# Patient Record
Sex: Male | Born: 1950
Health system: Southern US, Community
[De-identification: ages and names within clinical notes are randomized; demographics above are authoritative.]

## PROBLEM LIST (undated history)

## (undated) DIAGNOSIS — I5022 Chronic systolic (congestive) heart failure: Secondary | ICD-10-CM

## (undated) DIAGNOSIS — I2510 Atherosclerotic heart disease of native coronary artery without angina pectoris: Secondary | ICD-10-CM

## (undated) DIAGNOSIS — E039 Hypothyroidism, unspecified: Secondary | ICD-10-CM

## (undated) DIAGNOSIS — E782 Mixed hyperlipidemia: Secondary | ICD-10-CM

## (undated) DIAGNOSIS — N200 Calculus of kidney: Secondary | ICD-10-CM

## (undated) DIAGNOSIS — K760 Fatty (change of) liver, not elsewhere classified: Secondary | ICD-10-CM

## (undated) DIAGNOSIS — Z951 Presence of aortocoronary bypass graft: Secondary | ICD-10-CM

## (undated) DIAGNOSIS — I10 Essential (primary) hypertension: Secondary | ICD-10-CM

## (undated) DIAGNOSIS — E119 Type 2 diabetes mellitus without complications: Secondary | ICD-10-CM

## (undated) HISTORY — DX: Atherosclerotic heart disease of native coronary artery without angina pectoris: I25.10

## (undated) HISTORY — DX: Mixed hyperlipidemia: E78.2

## (undated) HISTORY — DX: Chronic systolic (congestive) heart failure: I50.22

## (undated) HISTORY — DX: Fatty (change of) liver, not elsewhere classified: K76.0

## (undated) HISTORY — DX: Essential (primary) hypertension: I10

## (undated) HISTORY — DX: Hypothyroidism, unspecified: E03.9

---

## 1979-08-24 HISTORY — PX: WISDOM TOOTH EXTRACTION: SHX21

## 2000-12-25 ENCOUNTER — Encounter: Payer: Self-pay | Admitting: Family Medicine

## 2000-12-25 ENCOUNTER — Ambulatory Visit (HOSPITAL_COMMUNITY): Admission: RE | Admit: 2000-12-25 | Discharge: 2000-12-25 | Payer: Self-pay | Admitting: Family Medicine

## 2001-01-12 ENCOUNTER — Emergency Department (HOSPITAL_COMMUNITY): Admission: EM | Admit: 2001-01-12 | Discharge: 2001-01-13 | Payer: Self-pay | Admitting: Emergency Medicine

## 2001-01-12 ENCOUNTER — Encounter: Payer: Self-pay | Admitting: Emergency Medicine

## 2003-12-24 HISTORY — PX: CORONARY ANGIOPLASTY WITH STENT PLACEMENT: SHX49

## 2004-01-05 ENCOUNTER — Inpatient Hospital Stay (HOSPITAL_COMMUNITY): Admission: EM | Admit: 2004-01-05 | Discharge: 2004-01-10 | Payer: Self-pay | Admitting: Emergency Medicine

## 2004-01-24 HISTORY — PX: CORONARY ANGIOPLASTY: SHX604

## 2004-02-06 ENCOUNTER — Ambulatory Visit (HOSPITAL_COMMUNITY): Admission: RE | Admit: 2004-02-06 | Discharge: 2004-02-07 | Payer: Self-pay | Admitting: Cardiology

## 2004-11-28 ENCOUNTER — Ambulatory Visit: Payer: Self-pay | Admitting: Cardiology

## 2004-12-14 ENCOUNTER — Ambulatory Visit: Payer: Self-pay | Admitting: Cardiology

## 2004-12-31 ENCOUNTER — Ambulatory Visit: Payer: Self-pay | Admitting: Cardiology

## 2006-04-21 ENCOUNTER — Ambulatory Visit: Payer: Self-pay | Admitting: Cardiology

## 2007-08-14 ENCOUNTER — Ambulatory Visit: Payer: Self-pay | Admitting: Cardiology

## 2007-09-04 ENCOUNTER — Ambulatory Visit: Payer: Self-pay | Admitting: Cardiology

## 2007-09-04 LAB — CONVERTED CEMR LAB
BUN: 9 mg/dL (ref 6–23)
CO2: 28 meq/L (ref 19–32)
Calcium: 9.3 mg/dL (ref 8.4–10.5)
Chloride: 107 meq/L (ref 96–112)
Creatinine, Ser: 0.9 mg/dL (ref 0.4–1.5)
GFR calc Af Amer: 114 mL/min
GFR calc non Af Amer: 94 mL/min
Glucose, Bld: 154 mg/dL — ABNORMAL HIGH (ref 70–99)
Potassium: 3.5 meq/L (ref 3.5–5.1)
Sodium: 141 meq/L (ref 135–145)

## 2008-07-26 ENCOUNTER — Ambulatory Visit: Payer: Self-pay | Admitting: Cardiology

## 2008-07-26 LAB — CONVERTED CEMR LAB
Bilirubin Urine: NEGATIVE
Hemoglobin, Urine: NEGATIVE
Ketones, ur: NEGATIVE mg/dL
Leukocytes, UA: NEGATIVE
Nitrite: NEGATIVE
Specific Gravity, Urine: >=1.03 (ref 1.000–1.03)
Total Protein, Urine: NEGATIVE mg/dL
Urine Glucose: NEGATIVE mg/dL
Urobilinogen, UA: 0.2 (ref 0.0–1.0)
pH: 5.5 (ref 5.0–8.0)

## 2009-08-17 DIAGNOSIS — E039 Hypothyroidism, unspecified: Secondary | ICD-10-CM | POA: Insufficient documentation

## 2009-08-17 DIAGNOSIS — I10 Essential (primary) hypertension: Secondary | ICD-10-CM | POA: Insufficient documentation

## 2009-08-17 DIAGNOSIS — E782 Mixed hyperlipidemia: Secondary | ICD-10-CM | POA: Insufficient documentation

## 2009-08-17 DIAGNOSIS — I2510 Atherosclerotic heart disease of native coronary artery without angina pectoris: Secondary | ICD-10-CM | POA: Insufficient documentation

## 2009-08-17 DIAGNOSIS — Z9861 Coronary angioplasty status: Secondary | ICD-10-CM

## 2009-08-18 ENCOUNTER — Ambulatory Visit: Payer: Self-pay | Admitting: Cardiology

## 2009-11-28 ENCOUNTER — Telehealth: Payer: Self-pay | Admitting: Cardiology

## 2010-08-24 ENCOUNTER — Ambulatory Visit: Payer: Self-pay | Admitting: Cardiology

## 2011-01-22 NOTE — Assessment & Plan Note (Signed)
Summary: fu 1 year      Allergies Added: NKDA  Visit Type:  Follow-up Primary Zachary Ellis:  dr [name]  CC:  no complaints.  History of Present Illness: The patient is 60 years old and return for management of CAD. In 2005 he had a posterior MI treated with a drug-eluting stent the circumflex artery. She'll he thereafter he had an unsuccessful attempt at PCI of a chronic total occlusion of the LAD.  He's done quite well since that time with no chest pain palpitations or significant shortness of breath. He works for [employer] and it has quieted a strenuous job involves working in the heat and doing stooping and lifting. Despite this he saw that he well.  His other problems include hypertension and hyperlipidemia.  Current Medications (verified): 1)  Norvasc 10 Mg Tabs (Amlodipine Besylate) .... Take One Daily 2)  Carvedilol 12.5 Mg Tabs (Carvedilol) .... Take One Tablet By Mouth Twice A Day 3)  Aspirin Ec 325 Mg Tbec (Aspirin) .... Take One Tablet By Mouth Daily 4)  Multivitamins   Tabs (Multiple Vitamin) .... Daily 5)  Quinapril Hcl 40 Mg Tabs (Quinapril Hcl) .... 2 Tabd Once Daily 6)  Simvastatin 40 Mg Tabs (Simvastatin) .... Take One Tablet By Mouth Daily At Bedtime  Allergies (verified): No Known Drug Allergies  Past History:  Past Medical History: Reviewed history from 08/17/2009 and no changes required. Current Problems:  CORONARY ARTERY DISEASE (ICD-414.00) HYPERLIPIDEMIA, MIXED (ICD-272.2) HYPERTENSION (ICD-401.9) HYPOTHYROIDISM (ICD-244.9)  1. Coronary artery disease status post posterior wall myocardial     infarction treated with a drug-eluting stent to circumflex artery     in 2005 with unsuccessful PCI chronic total occlusion of the left anterior     descending artery. 2. Good left ventricular function. 3. Hyperlipidemia. 4. Hypertension under optimal control. 5. Treated hypothyroidism.   Review of Systems       ROS is negative except as outlined in HPI.    Vital Signs:  Patient profile:   60 year old male Height:      71 inches Weight:      241 pounds BMI:     33.73 Pulse rate:   65 / minute BP sitting:   132 / 91  (left arm)  Vitals Entered By: Burnett Kanaris, CNA (August 24, 2010 2:04 PM)  Physical Exam  Additional Exam:  Gen. Well-nourished, in no distress   Neck: No JVD, thyroid not enlarged, no carotid bruits Lungs: No tachypnea, clear without rales, rhonchi or wheezes Cardiovascular: Rhythm regular, PMI not displaced,  heart sounds  normal, no murmurs or gallops, no peripheral edema, pulses normal in all 4 extremities. Abdomen: BS normal, abdomen soft and non-tender without masses or organomegaly, no hepatosplenomegaly. MS: No deformities, no cyanosis or clubbing   Neuro:  No focal sns   Skin:  no lesions    Impression & Recommendations:  Problem # 1:  CORONARY ARTERY DISEASE (ICD-414.00) He had a drug-eluting stent to the circumflex artery for a posterior MI in 2005. He had an unsuccessful attempt at PCI of a chronic total occlusion of the LAD. He's not had any recent chest pain this problem is stable. His updated medication list for this problem includes:    Norvasc 10 Mg Tabs (Amlodipine besylate) .Marland Kitchen... Take one daily    Carvedilol 12.5 Mg Tabs (Carvedilol) .Marland Kitchen... Take one tablet by mouth twice a day    Aspirin Ec 325 Mg Tbec (Aspirin) .Marland Kitchen... Take one tablet by mouth daily  Quinapril Hcl 40 Mg Tabs (Quinapril hcl) .Marland Kitchen... 2 tabd once daily  Orders: EKG w/ Interpretation (93000)  His updated medication list for this problem includes:    Norvasc 10 Mg Tabs (Amlodipine besylate) .Marland Kitchen... Take one daily    Carvedilol 12.5 Mg Tabs (Carvedilol) .Marland Kitchen... Take one tablet by mouth twice a day    Aspirin Ec 325 Mg Tbec (Aspirin) .Marland Kitchen... Take one tablet by mouth daily    Quinapril Hcl 40 Mg Tabs (Quinapril hcl) .Marland Kitchen... 2 tabd once daily  Problem # 2:  HYPERTENSION (ICD-401.9) His blood pressure is borderline today but he says it runs  120/80 at home. We will continue current therapy. His updated medication list for this problem includes:    Norvasc 10 Mg Tabs (Amlodipine besylate) .Marland Kitchen... Take one daily    Carvedilol 12.5 Mg Tabs (Carvedilol) .Marland Kitchen... Take one tablet by mouth twice a day    Aspirin Ec 325 Mg Tbec (Aspirin) .Marland Kitchen... Take one tablet by mouth daily    Quinapril Hcl 40 Mg Tabs (Quinapril hcl) .Marland Kitchen... 2 tabd once daily  Orders: EKG w/ Interpretation (93000)  His updated medication list for this problem includes:    Norvasc 10 Mg Tabs (Amlodipine besylate) .Marland Kitchen... Take one daily    Carvedilol 12.5 Mg Tabs (Carvedilol) .Marland Kitchen... Take one tablet by mouth twice a day    Aspirin Ec 325 Mg Tbec (Aspirin) .Marland Kitchen... Take one tablet by mouth daily    Quinapril Hcl 40 Mg Tabs (Quinapril hcl) .Marland Kitchen... 2 tabd once daily  Problem # 3:  HYPERLIPIDEMIA, MIXED (ICD-272.2) This is managed with simvastatin. He is to get a lipid profile with his primary care physician soon. His updated medication list for this problem includes:    Simvastatin 40 Mg Tabs (Simvastatin) .Marland Kitchen... Take one tablet by mouth daily at bedtime  Orders: EKG w/ Interpretation (93000)  Patient Instructions: 1)  Your physician wants you to follow-up in: 1year with Dr. Clifton James. You will receive a reminder letter in the mail two months in advance. If you don't receive a letter, please call our office to schedule the follow-up appointment. 2)  Your physician recommends that you continue on your current medications as directed. Please refer to the Current Medication list given to you today.

## 2011-04-10 ENCOUNTER — Other Ambulatory Visit: Payer: Self-pay | Admitting: *Deleted

## 2011-04-10 ENCOUNTER — Telehealth: Payer: Self-pay | Admitting: Cardiovascular Disease

## 2011-04-10 MED ORDER — CARVEDILOL 12.5 MG PO TABS: 12.5000 mg | ORAL_TABLET | Freq: Two times a day (BID) | ORAL | Status: DC

## 2011-04-11 MED ORDER — CARVEDILOL 12.5 MG PO TABS: 12.5000 mg | ORAL_TABLET | Freq: Two times a day (BID) | ORAL | Status: DC

## 2011-04-11 NOTE — Telephone Encounter (Signed)
Refill sent in x 4--call made to confirm receipt.

## 2011-05-07 NOTE — Assessment & Plan Note (Signed)
[facility]                            CARDIOLOGY OFFICE NOTE   NAME:Saindon, WAINO MOUNSEY                       MRN:          [id number]  DATE:08/14/2007                            DOB:          01-20-1951    PRIMARY CARE PHYSICIAN:  Windle Guard, M.D.   ENDOCRINOLOGIST:  Tera Mater. Evlyn Kanner, M.D.   CLINICAL HISTORY:  Mr. Durio is 60 years old.  He had a posterior wall  infarction treated with drug-eluting stent to the circumflex artery in  2005 and had an unsuccessful attempt at PCI of the chronically, totally  occluded LAD after that.  He has done quite well since that time and has  no symptoms of chest pain, shortness of breath or palpitations.  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   PAST MEDICAL HISTORY:  His past history is significant for hypertension  and hyperlipidemia.  He indicates his blood pressures at home have been  sometimes in the 140's and bottom number has been in the range of 90's  sometimes.  He also has hyperlipidemia and has been followed by Dr.  Evlyn Kanner for that.  He has a low HDL.   CURRENT MEDICATIONS:  1. Quinapril.  2. Coreg.  3. Vytorin.  4. Vitamins.  5. Aspirin.   PHYSICAL EXAMINATION:  VITAL SIGNS:  Blood pressure 163/108 and pulse is  73 and regular.  NECK:  There was no venous distention.  The carotid pulses were full  without bruits.  CHEST:  Clear.  CARDIAC:  Rhythm was regular.  There is a short, late systolic murmur at  the apex.  ABDOMEN:  Soft without organomegaly.  EXTREMITIES:  Peripheral pulses were full.  There is no peripheral  edema.   IMPRESSION:  1. Coronary artery disease, status post prior posterior wall      infarction treated with a drug-eluting stent to the circumflex      artery with chronic total occlusion of the left anterior      descending.  2. Good left ventricular function.  3. Hyperlipidemia.  4. Hypertension, not under optimal control.  5. Treated  hypothyroidism.   RECOMMENDATIONS:  Mr. Wandell blood pressures are higher in the office  but he has had some high readings at home, too, and I think we should  adjust his medicines.  We will increase  his Quinapril from 40 to 80 a day.  Will get a BMP and a blood pressure  check in one week.  He will follow up with Dr. Evlyn Kanner and Dr. [name] and  I will see him back in followup in one year.     Bruce Elvera Lennox Juanda Chance, MD, [facility]  Electronically Signed    BRB/MedQ  DD: 08/14/2007  DT: 08/15/2007  Job #: [id number]   cc:   Windle Guard, M.D.  Tera Mater. Evlyn Kanner, M.D.

## 2011-05-07 NOTE — Assessment & Plan Note (Signed)
[facility]                            CARDIOLOGY OFFICE NOTE   NAME:Zachary Ellis, Zachary Ellis                       MRN:          [id number]  DATE:07/26/2008                            DOB:          1951/06/26    PRIMARY CARE PHYSICIAN:  Windle Guard, MD.   CLINICAL HISTORY:  Zachary Ellis is 60 years old and returns for followup  management of hiss coronary heart disease.  In 2005, he had a posterior  wall infarction treated with a drug-eluting stent to the circumflex  artery.  He subsequently had an unsuccessful attempt at PCI with  chronically occluded LAD.  He has done well since that time.  He has had  no recent chest pain, shortness of breath, or palpitations.  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   PAST MEDICAL HISTORY:  Significant for hypertension and hyperlipidemia.  He says his blood pressures have been elevated on previous readings as  well as today.  He also has had episode of hematuria and has a history  of kidney stones.  Dr. [name] had considered referral to a urologist and  he asked my opinion about that today.   CURRENT MEDICATIONS:  1. Quinapril 40 mg 2 tablets daily.  2. Simvastatin 40 mg daily.  3. Coreg 12.5 mg b.i.d.  4. Aspirin.   He indicated that he had a recent lipid profile, but he was not taking  his cholesterol medicines at this time, and his total cholesterol was  about 220 and his HDL was 39.   PHYSICAL EXAMINATION:  VITAL SIGNS:  Blood pressure is 160/100 and a  pulse of 70 and regular.  NECK:  There was no venous distension.  The carotid pulses were full  without bruits.  CHEST:  Clear.  CARDIAC:  Rhythm was regular.  I can hear no murmurs or gallops.  ABDOMEN:  Soft.  No organomegaly.  EXTREMITIES:  Peripheral pulses were full and no peripheral edema.   Electrocardiogram showed an old inferolateral infarct.   IMPRESSION:  1. Coronary artery disease status post posterior wall  myocardial      infarction treated with a drug-eluting stent to circumflex artery      in 2005 with chronic total occlusion of the left anterior      descending artery.  2. Good left ventricular function.  3. Hyperlipidemia.  4. Hypertension under optimal control.  5. Treated hypothyroidism.   RECOMMENDATIONS:  Zachary Ellis' blood pressure is up and I do not think we  really will get him to target without adding a third drug.  We will put  him on amlodipine 10 mg daily.  We will get a followup urinalysis today  and if he still has hematuria, then I will refer him to a urologist for  further evaluation.  I will plan to see him back in followup in a year.     Bruce Elvera Lennox Juanda Chance, MD, [facility]  Electronically Signed    BRB/MedQ  DD: 07/26/2008  DT: 07/27/2008  Job #: [id number]

## 2011-05-10 NOTE — Consult Note (Signed)
NAME:  Zachary Ellis, Zachary Ellis                          ACCOUNT NO.:  [id number]   MEDICAL RECORD NO.:  [id number]                   PATIENT TYPE:  INP   LOCATION:  2033                                 FACILITY:  [facility]   PHYSICIAN:  Tera Mater. Evlyn Kanner, M.D.              DATE OF BIRTH:  May 26, 1951   DATE OF CONSULTATION:  01/10/2004  DATE OF DISCHARGE:                                   CONSULTATION   ENDOCRINE   HISTORY OF PRESENT ILLNESS:  Mr. Witz is a 60 year old white male who  presented with an acute inferior MI on January 13.  He underwent emergent  catheterization with stenting and has done well since.  As part of his  routine workup, thyroid testing was done.  He previously had thyroid testing  done around Thanksgiving at which time he did have some evidence of possible  hyperthyroidism.  By his report, blood testing only has been done and he has  not had any scans or uptake.  Apparently, repeat testing was recommended but  had not yet been done.  The patient has done with this acute cardiac event.   REVIEW OF SYSTEMS:  Negative for palpations or tremor.  His sleep has been  fairly good except while here.  He has had a 34 pound partially intentional  weight loss.  He has had some heat intolerance noted.  He has had loose  stools only on the day of admission.  He has had no local eye symptoms or  neck symptoms.   FAMILY HISTORY:  Family history is positive in maternal relatives for  hyperthyroidism in some aunts.   PAST MEDICAL HISTORY:  1. Hypertension.  2. Hyperlipidemia.  3. He has no history of diabetes or thyroid disease.   SOCIAL HISTORY:  He is not a smoker or drinker.   MEDICATIONS:  He was on some Accuretic prior to coming in.  Currently, his  medication list includes:  1. Aspirin 325 mg.  2. Lipitor 80 mg.  3. Coreg 3.125 mg twice a day.  4. Plavix 75 mg daily.  5. Timol eye drops.  6. Provatene eye drops.  7. P.r.n. Xanax.   PHYSICAL EXAMINATION:  VITAL SIGNS:   Blood pressure today was 95/62, pulse  76, respirations 20, temperature 97.2.  GENERAL:  He is healthy, youthful appearing, sitting up in no distress.  HEENT:  Sclerae are anicteric.  Mucous membranes are moist.  There is no  exophthalmos, proptosis, or lid lag.  Extraocular movements are intact and  conjugate.  NECK:  Supple.  I do not feel any enlargement of the thyroid.  LUNGS:  Clear.  HEART:  Regular and distant.  I do not hear any murmurs.  ABDOMEN:  Soft and nontender.  EXTREMITIES:  No pretibial changes.  Pulses are relatively intact.  No edema  is present.  NEUROLOGIC:  The patient is awake, alert, mentating perfectly well.  Speech  is clear.  No resting tremor is present.  No abnormal reflexes are present.   LABORATORY DATA:  TSH was 0.05 with a free T4 1.73 and a free T4 of 3.4.  Repeat was a TSH of 0.09, free T4 of 1.60, and free T3 3.0.  White count was  6600, hemoglobin 12.3, platelets 195,000.  Chemistries show sodium 142,  potassium 4.2, chloride 109, CO2 28, BUN 8, creatinine 0.8, glucose 105.  SGOT was 39, SGPT 45, total protein 6.3.  Total cholesterol was 133,  triglycerides 84, HDL 24, LDL 93.  Homocysteine was 6.20.  Chest x-ray  showed no active chest disease present.  His cardiac catheterization showed  an EF of 45%, per the notes in the chart.   SUMMARY:  We have a 60 year old white male presenting with acute MI, now  status post stenting and doing fairly well.  He has biochemical evidence of  mild hyperthyroidism with few clinical symptoms.  He has no endocrine eye  disease at the present time.  This suggests Grave's disease or Grave's  ophthalmolopathy.  At the present time, he is relatively well compensated.  Given his recent dye load and his recent cardiac event, there is a small  chance that a toxic nodular or autonomous goiter could be worsened.  I think  the most prudent course of action would be to cover him with PTU 50 mg twice  a day, which is a  fairly modest dose, continue his Coreg for some beta  blockade, and watch him carefully.  We need to recheck his thyroid tests in  a couple of weeks and make sure that these are doing well.  His baseline  liver function testing and white blood cell count are normal.  Further  workup including scan and uptake will have to be delayed at least six weeks  due to the dye load.  I do not see any help that we would get at the present  from a thyroid ultrasound.  Will consider further workup as an outpatient.  I would be pleased to see him back in the outpatient setting.                                               Tera Mater. Evlyn Kanner, M.D.    SAS/MEDQ  D:  01/10/2004  T:  01/10/2004  Job:  [id number]

## 2011-05-10 NOTE — Discharge Summary (Signed)
NAME:  BURDETTE, FOREHAND                          ACCOUNT NO.:  [id number]   MEDICAL RECORD NO.:  [id number]                   PATIENT TYPE:  OIB   LOCATION:  6523                                 FACILITY:  [facility]   PHYSICIAN:  Charlies Constable, M.D.                  DATE OF BIRTH:  [birth year]   DATE OF ADMISSION:  02/06/2004  DATE OF DISCHARGE:  02/07/2004                                 DISCHARGE SUMMARY   DISCHARGE DIAGNOSES:  1. Coronary artery disease.     A. Status post unsuccessful attempt at percutaneous coronary intervention        of the chronically occluded left anterior descending- medical therapy        planned.     B. Status post inferior/posterior myocardial infarction January 05, 2004        treated with stenting to the circumflex.     C. Ejection fraction 50%.  2. Treated dyslipidemia.  3. Hypertension.  4. Hyperthyroidism.     A. Followed by Dr. Evlyn Kanner.   PROCEDURES PERFORMED THIS ADMISSION:  Cardiac catheterization by Dr. Charlies Constable on February 06, 2004.  LAD total, 70-80% diagonal, circumflex stent  okay, RCA okay.  Left ventriculogram revealing inferior hypokinesis and EF  of 50%.   HOSPITAL COURSE:  Please see the office note from January 25, 2004 by Dr.  Juanda Chance for complete details.  Briefly, this 60 year old male status post  inferior posterior myocardial infarction January 05, 2004 treated with  stenting to the circumflex was seen back in followup on January 25, 2004.  At his catheterization in January, it was discovered that he had totally  occluded LAD.  Dr. Juanda Chance adjusted the patient's medications and brought him  back for stress Cardiolite study on February 7.  This revealed inferolateral  and inferior infarction.  Ischemia was present in the distal anterior wall  and apex.  It was decided to bring the patient in for elective  catheterization and this was done on February 06, 2004.  The results are  noted above.  An unsuccessful attempt was made at PCI  of the chronically  occluded LAD.  Post catheterization the patient's temperature went up to 102  and the patient was kept overnight for observation.  His temperature  gradually came down on the morning of February 07, 2004.  It had normalized  to 98.8.  The patient did have blood cultures drawn and these were still  pending at the time of this dictation.  On the morning of February 15, his  white count was 4700, hemoglobin 12.8, hematocrit 37.9, platelet count  189,000, sodium 137, potassium 3.8, BUN 7, creatinine 1, glucose 124 and  post procedure CK-MB was negative at 2.0.   It was felt the patient was stable enough for discharge to home.  He would  remain on his current medications and have followup with Dr. Juanda Chance in two  weeks.   Labs are as noted above in the hospital course.   DISCHARGE MEDICATIONS:  1. Plavix 75 mg daily.  2. Aspirin 325 mg daily.  3. Lipitor 80 mg q.h.s.  4. Coreg 6.25 mg b.i.d.  5. Propylthiouracil 50 mg b.i.d.  6. Timolol eye drops.  7. Travatan eye drops.  8. Accupril 20 mg daily.  9. Multivitamin daily.  10.      Nitroglycerin p.r.n. chest pain.   PAIN MANAGEMENT:  Tylenol as needed.  Nitroglycerin as needed for chest  pain.  He is to call our office with recurrent chest pain.   ACTIVITY:  No driving, heavy lifting, exertional work for three days.   DIET:  Low fat, low sodium.   WOUND CARE:  The patient should call our office for any groin swelling,  bleeding or bruising.   FOLLOW UP:  Dr. Juanda Chance on February 24, 2004 at 11:30.  He should follow up with  Dr. [name] and Dr. Evlyn Kanner as needed.      Tereso Newcomer, P.A.                        Charlies Constable, M.D.    SW/MEDQ  D:  02/07/2004  T:  02/07/2004  Job:  [id number]   cc:   Jeannett Senior A. Evlyn Kanner, M.D.  [address]  [place]  [place] [address]  Fax: [phone]   Hadassah Pais. [name], M.D.

## 2011-05-10 NOTE — H&P (Signed)
NAME:  Zachary Ellis, Zachary Ellis                            ACCOUNT NO.:  [id number]   MEDICAL RECORD NO.:  [id number]                   PATIENT TYPE:  INP   LOCATION:  1825                                 FACILITY:  [facility]   PHYSICIAN:  Charlies Constable, M.D.                  DATE OF BIRTH:  May 25, 1951   DATE OF ADMISSION:  01/05/2004  DATE OF DISCHARGE:                                HISTORY & PHYSICAL   CHIEF COMPLAINT:  Chest pain.   HISTORY OF PRESENT ILLNESS:  Zachary Ellis is a 60 year old man with no known  history of coronary artery disease.  His risk factors include hypertension.  He was at work today, and he had sudden onset of substernal 10/10 chest pain  at 3:15 p.m.  His boss brought him emergency room by car.  He was given  aspirin, heparin, nitroglycerin, and 4 mg of morphine.  His pain went from a  10/10 to a 3-4/10.  He had some associated shortness of breath and mild  diaphoresis but denies nausea or vomiting.  His EKG is acutely abnormal, and  his is to be transported to the cath lab for an emergent cath.   PAST MEDICAL HISTORY:  Significant for hypertension.  He had recent lipid  profile but states he has never been told his cholesterol was high.  He has  no history of diabetes.   He has a family history of coronary artery disease in later years but no  family history of premature coronary artery disease.  He has no history of  tobacco use.  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   PAST SURGICAL HISTORY:  None noted.   ALLERGIES:  No known drug allergies.   MEDICATIONS:  The patient describes Accupril at 40 mg q.d. but then states  it is a combination product, so I am not clear on what this is exactly.  He  also takes Timoptic eye drops q.a.m. and Travatan eye drops q.p.m.   SOCIAL HISTORY:  He lives in [place], [place], and he is married.  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  He has no history of tobacco use or ETOH use.   REVIEW OF SYSTEMS:  Significant for diarrhea this a.m.  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  He has  reading glasses.  He denies any recent fevers or chills, or any hematemesis,  hemoptysis, or melena.  Review of systems is otherwise negative.   PHYSICAL EXAMINATION:  VITAL SIGNS:  Temperature 97.9, blood pressure 94/59,  heart rate 93, respiratory rate 14.  GENERAL:  He is a well-developed and well-nourished white male in moderate  distress.  HEENT:  Head is normocephalic and atraumatic.  Pupils are equal, round and  reactive to  light and accommodation.  Extraocular movements are intact.  NECK:  Supple and without lymphadenopathy, thyromegaly, bruits, or JVD.  LUNGS:  Clear to auscultation bilaterally.  HEART:  His heart is regular rate and rhythm with no significant murmur, rub  or gallop noted.  He has clear S1 and S2 but no S3 or S4 is identified.  ABDOMEN:  Soft and nontender with active bowel sounds.  No  hepatosplenomegaly on exam.  EXTREMITIES:  He has no edema.  No clubbing or cyanosis.  Distal pulses are  2+ in all four extremities, and no femoral bruits are appreciated.  NEUROLOGIC:  Alert and oriented with no focal deficits identified.   Chest x-ray is pending.   EKG is sinus rhythm with a rate of 88 beats per minute with acute inferior  ST elevation and anterior T wave inversion with ST depressions.   LABORATORY VALUES:  Hemoglobin 15.8, hematocrit 45.8, WBC 13.5, platelets  297.  INR 1.0, PTT 29.  Sodium 137, potassium 3.3, chloride 101, BUN 12,  glucose 129, CO2 28, creatinine 0.8.  Initial point-of-care markers:  Myoglobin 81.2, CK-MB 2.8.  Troponin I is less than 0.03.   ASSESSMENT/PLAN:  1. Acute inferior myocardial infarction:  Patient will be taken emergently     to cath lab.  He has had aspirin, nitro, heparin, and morphine.  Further     interventions will  depend on the cath results.  2. Recent lipid profile:  Results will be obtained from Dr. [name] at     [facility], but he will be started on empiric statin and Zocor 40 mg     p.o. q.d.  3. Glaucoma condition:  Continue home eye drops.  4. Hypertension:  Hold for now with medication additions and adjustments,     including beta blocker and restarting ACE inhibitor possibly at a lower     dose, per MD.      Theodore Demark, P.A. [facility]                  Charlies Constable, M.D.    RB/MEDQ  D:  01/05/2004  T:  01/05/2004  Job:  [id number]   cc:   Windle Guard, M.D.  [address]  [place], [place] [address]  Fax: [phone]

## 2011-05-10 NOTE — Cardiovascular Report (Signed)
NAME:  EBUBECHUKWU, JEDLICKA                          ACCOUNT NO.:  [id number]   MEDICAL RECORD NO.:  [id number]                   PATIENT TYPE:  OIB   LOCATION:  2899                                 FACILITY:  [facility]   PHYSICIAN:  Charlies Constable, M.D.                  DATE OF BIRTH:  September 06, 1951   DATE OF PROCEDURE:  02/06/2004  DATE OF DISCHARGE:                              CARDIAC CATHETERIZATION   PROCEDURE:  Cardiac catheterization and attempted percutaneous coronary  intervention.   CLINICAL HISTORY:  Mr. Holycross is 60 years old and on January 05, 2004,  suffered a posterior wall myocardial infarction for which he underwent  stenting of the circumflex artery.  He developed ventricular fibrillation  during the diagnostic part of the procedure, requiring cardioversion.  He  had a totally occluded LAD and we performed a Cardiolite scan on him  recently, which showed anterior ischemia.  We brought him back for an  attempt at opening the chronically-occluded LAD.   DESCRIPTION OF PROCEDURE:  The procedure was performed to the right femoral  artery using an arterial sheath and 6 French preformed coronary catheters.  A front wall arterial puncture was performed and Omnipaque contrast was  used.  We used a 7 [place] Q4 guiding catheter with side holes and initially  tried a CrossIt 200 wire.  We were unable to cross with this, and we went in  with a CrossIt 300 with support from a 2.0 x 15 Maverick balloon.  Once  again we were unable to cross the lesion.  It was difficult because there  were two side branches right before the complete occlusion, one of which was  a diagonal and one of which was a septal perforator.  We then tried an Community education officer with support from the balloon and again were unable to cross, and  finally we tried a Mudlogger.   The patient had back pain during  the procedure and we gave him fentanyl,  and he developed severe shakes about the time we were ready to stop trying  to cross the chronic total.  I was planning to stent the ostium of the  diagonal branch, which had a 70-80% lesion, but because of the marked shakes  we thought that this was inadvisable.  For this reason we stopped and closed  the right femoral artery and gave him Narcan to reverse what we thought  might be a fentanyl reaction.   He left the laboratory still with shakes but otherwise clinically stable.   RESULTS:  1. Left main coronary artery:  The left main coronary artery is free of     significant disease.  2. Left anterior descending artery:  The left anterior descending gave rise     to a diagonal branch and a septal perforator and then was completely     occluded.  There was moderate calcification.  There was  70-80% ostial     stenosis in the diagonal branch.  3. Circumflex artery:  The circumflex artery gave rise to an intermediate     branch and an AV branch, which gave rise to a marginal branch.  There was     30% narrowing in the proximal circumflex artery.  There was 0% stenosis     at the stent site in the proximal portion of the marginal branch.  4. Right coronary artery:  The right coronary artery is a dominant vessel     that gave rise to a right ventricular branch, a posterior descending, and     three posterior lateral branches.  These vessels were free of significant     disease.  5. Left ventriculogram:  The left ventriculogram performed in the RAO     projection showed hypokinesis of the inferior wall.  The apex and     anterolateral wall moved well, and the estimated ejection fraction was     50%.  6. The left ventriculogram performed in the RAO projection showed good wall     motion with no definite areas of hypokinesis, although it was somewhat     underfilled.  7. The aortic pressure was 84/60 with a mean of 72, and left ventricular     pressure was 84/13.   CONCLUSION:  1. Coronary artery disease, status post recent posterior wall infarction,     treated  with stenting with 0% stenosis at the stent site of the     circumflex artery, total occlusion of the proximal left anterior     descending artery, and 70-80% stenosis in the ostium of the diagonal     branch, no significant obstruction of the right coronary artery, and     inferior wall hypokinesis.  2. Unsuccessful attempt at PCI of a chronically-occluded left anterior     descending artery due to inability to cross with the wire.   DISPOSITION:  The patient was returned to the holding area for further  observation.  Will probably plan to treat the diagonal branch with medical  therapy at this point.                                               Charlies Constable, M.D.    BB/MEDQ  D:  02/06/2004  T:  02/06/2004  Job:  [id number]   cc:   Windle Guard, M.D.  [address]  [place], [place] [address]  Fax: [phone]   Cardiopulmonary Lab

## 2011-05-10 NOTE — Discharge Summary (Signed)
NAME:  IKTAN, AIKMAN                          ACCOUNT NO.:  [id number]   MEDICAL RECORD NO.:  [id number]                   PATIENT TYPE:  INP   LOCATION:  2033                                 FACILITY:  [facility]   PHYSICIAN:  Charlies Constable, M.D.                  DATE OF BIRTH:  09-25-1951   DATE OF ADMISSION:  01/05/2004  DATE OF DISCHARGE:  01/10/2004                                 DISCHARGE SUMMARY   PROCEDURE:  1. Cardiac catheterization.  2. Coronary arteriogram.  3. Left ventriculogram.  4. PTCA and stent to one vessel.   HOSPITAL COURSE:  The patient is a 60 year old male with no known history of  coronary artery disease.  He had onset of substernal chest pain on the day  of admission as a 10/10.  It started at about 3:15 p.m. and was associated  with shortness of breath and diaphoresis.  He had no nausea and vomiting.  His boss brought him to the emergency room by car and there his EKG was  acutely abnormal.  He received IV nitroglycerin as well as sublingual  nitroglycerin, aspirin, and heparin.  He was still having 3 to 4 out of 10  chest pain and he was taken urgently to the catheterization lab.  Door to  balloon was 110 minutes.  The cardiac catheterization showed a totaled  circumflex which was treated with PTCA and a Taxis stent.  His LAD was  totaled and the first diagonal had a 70% stenosis.  His EF was approximately  45% with apical hypokinesis and inferior hypo and akinesis.  Dr. Juanda Chance felt  that a Cardiolite was indicated in a few weeks once he recovered from this  acute event and then percutaneous intervention of the LAD plus or minus the  diagonal if anterior ischemia is seen.   He tolerated the procedure well and the sheath was removed without  difficulty.  His systolic blood pressure was low and generally ran in the  90's and he had Lopressor added to his medication regimen, but this was  frequently held because of hypotension.  This was changed to Coreg 3.125  mg  b.i.d. and he tolerated this.  He is not on an ACE inhibitor secondary to  hypotension.  He is asymptomatic with a systolic blood pressure of 90 or  above.   As part of his evaluation, a lipid profile was performed which showed an HDL  of 24 and an LDL of 93.  Lipitor 80 was added to his medication regimen and  he needs to get LFT's and a follow-up lipid profile in 6 to 12 weeks.  Additionally, a TSH was drawn and it was extremely low at 0.005.  A T3 and  T4 were checked as well as a T3 uptake.  These were all within normal limits  except for a T3 uptake that was minimally elevated at 42.  An  endocrine  consult was called.   Dr. Evlyn Kanner saw the patient and felt that his circulating levels were still  within normal limits.  This might represent a low grade shock thyroiditis.  With recent dye load, no further testing is indicated for four to six weeks  as an ultrasound would not really change management.  He recommended adding  Propylthiouracil at 50 mg b.i.d. and will follow up with Dr. Evlyn Kanner.   The patient was seen by cardiac rehab and was educated regarding MI  restrictions, exercise guidelines, use of nitroglycerin, and cardiac risk  fact reduction.  He is also referred for outpatient cardiac rehab.   By January 10, 2004, the patient was ambulating without chest pain or  shortness of breath.  He was considered stable for discharge and is to have  outpatient follow-up arranged.   LABORATORY DATA:  Hemoglobin 12.3, hematocrit 35.5, WBC 6.6, and platelets  195.  Sodium 142, potassium 4.2, chloride 109, CO2 28, BUN 8, creatinine  0.9, glucose 105.  On January 17, AST 39, ALT 45, total bilirubin 0.9.  CK-  MB peak 3014/659.  Glycosylated hemoglobin 5.7, homocysteine 6.2.   Chest x-ray; mild bronchitic changes with no active disease.   CONDITION ON DISCHARGE:  Improved.   DISCHARGE DIAGNOSES:  1. Acute myocardial infarction, status post Taxis stent to the circumflex     this  admission.  2. Residual coronary artery disease with an left anterior descending total,     and first diagonal 70%, outpatient Cardiolite in approximately four     weeks.  3. Left ventricular dysfunction with an ejection fraction of 45% at     catheterization.  4. Hypertension.  5. Dyslipidemia.  6. Family history of coronary artery disease.  7. Preglaucoma.  8. Osteoarthritis.  9. Hypotension with systolic blood pressure approximately 90.  10.      Abnormal thyroid function tests with decreased TSH and elevated T3     uptake.   DISCHARGE INSTRUCTIONS:  1. His activity level is to include exercise per rehab guidelines and no     strenuous activity or work until cleared by M.D.  2. He is to stick to a low fat and salt diet.  3. He is to call the office for problems with catheterization site.  4. He is to follow up with Dr. Evlyn Kanner and has an appointment with Dr. Juanda Chance     on February 2.  He is to see Dr. [name] in three to four weeks.   DISCHARGE MEDICATIONS:  1. Aspirin 325 mg daily.  2. Plavix 75 mg daily.  3. Timoptic and Travatan eye drops as prior to admission.  4. Lipitor 80 mg daily.  5. Coreg 3.125 mg b.i.d.  6. Nitroglycerin p.r.n.  7. PTU 50 mg b.i.d.      Theodore Demark, P.A. [facility]                  Charlies Constable, M.D.    RB/MEDQ  D:  01/10/2004  T:  01/11/2004  Job:  [id number]   cc:   Windle Guard, M.D.  [address]  [place], [place] [address]  Fax: [phone]   Tera Mater. Evlyn Kanner, M.D.  [address]  [place]  [place] [address]  Fax: [phone]

## 2011-05-10 NOTE — Cardiovascular Report (Signed)
Zachary Ellis, Zachary Ellis                            ACCOUNT NO.:  [id number]   MEDICAL RECORD NO.:  [id number]                   PATIENT TYPE:  INP   LOCATION:  2925                                 FACILITY:  [facility]   PHYSICIAN:  Charlies Constable, M.D.                  DATE OF BIRTH:  February 08, 1951   DATE OF PROCEDURE:  01/05/2004  DATE OF DISCHARGE:                              CARDIAC CATHETERIZATION   PROCEDURES PERFORMED:  1. Cardiac catheterization.  2. Percutaneous coronary intervention.   CARDIOLOGIST:  Charlies Constable, M.D.   CLINICAL HISTORY:  Zachary Ellis is 60 years old and has no prior history of  known heart disease.  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  He has a history  of hypertension, but no history of diabetes, hyperlipidemia and no tobacco  use.  His father died at 69 of a myocardial infarction.  He had the onset of  chest pain at 3:15 P.M. today and came to [facility] emergency room with EKG  changes of an acute inferior posterior infarction.  He was seen by Lavella Hammock and myself, and transported promptly to the cardiac catheterization  lab for catheterization and probable intervention.   DESCRIPTION OF PROCEDURE:  The procedure was performed via the right femoral  artery using arterial sheath and 6 French preformed coronary catheters.  A  front wall arterial puncture was performed and Omnipaque contrast was used.  After placing the pigtail catheter in the left ventricle the patient  developed ventricular fibrillation requiring DC cardioversion.  Following  the ventriculogram we made the decision to proceed with intervention on the  circumflex artery.  We used a CLS-3.5  6 [place] guiding catheter with side  holes and a PT-2 light support wire.  We initially passed the wire across  the totally blocked vessel, but this did not establish flow.  We dilated the  lesion with a balloon and established some flow, and realized we were in a  small side branch.  We then renavigated the  wire down the main branch with  some difficulty.  We then dilated a 2.5 x 20 mm Maverick performing one  inflation up to eight atmospheres for 30 seconds.  This reestablished flow  in the vessel.  We then stented the lesion with a 3.0 x 24 mm TAXUS stenting  deploying this with one inflation of 12 atmospheres for 30 seconds.  Repeat  diagnostic studies were then performed through the guiding catheter.   The patient remained stable throughout the procedure following his  defibrillation and left the laboratory in stable condition.   RESULTS:   ANGIOGRAPHIC DATA:  Left Main Coronary Artery:  The left main coronary  artery is free of significant disease.   Left Anterior Descending Artery:  The left anterior descending artery was  completely occluded after two septal perforators and a diagonal branch.  There was faint antegrade flow filling  the mid portion of the LAD.  The very  distal portion of the LAD filled via collaterals from the right coronary  artery.  There was also a 70% ostial narrowing in the large diagonal branch.   Circumflex Artery:  The circumflex artery gave rise to an intermediate  branch and then was completely occluded proximally.   Right Coronary Artery:  The right coronary artery was a moderately large  vessel that was very tortuous in its proximal portion.  The artery gave rise  to a conus branch, a right ventricular branch, a posterior descending and  three posterolateral branches.  These vessels were free of significant  disease.   VENTRICULOGRAPHIC DATA:  Left Ventriculogram:  The left ventriculogram  performed in the RAO projection showed hypokinesis of the apex and hypo- to  akinesis of the inferior wall, except the inferobasal wall was spared.  The  anterolateral wall moved fairly well.   The left ventriculogram performed in the LAO projection showed hypo- to  akinesis of the posterior and inferolateral walls out to the apex.   HEMODYNAMIC DATA:  The  aortic pressure was 103/73 with a mean 87.  Left  ventricular pressure was 103/22.   Following stenting of the lesion in the mid circumflex artery the stenosis  improved from 100% to 0% and the flow improved from TIMI 0 to TIMI III flow.  There was a sub-branch off the circumflex marginal that we crossed with the  stent that remained occluded and we did not attempt to open.  This appeared  to be a small vessel.   CONCLUSION:  1. Acute posterior wall myocardial infarction with total occlusion of the     proximal circumflex artery, total occlusion of the mid left anterior     descending and 70% narrowing of the diagonal branch of the left anterior     descending, no major obstruction of the right coronary artery, and apical     wall hypokinesis and inferior wall and posterior wall hypo- to akinesis     with an estimated ejection fraction of 45%.  2. Successful stenting of the totally occluded circumflex artery with a     TAXUS stent with improvement in percent narrowing from 100% to 0%.   DISPOSITION:  The patient was taken to the post angio suite for further  observation.   The patient had the onset of chest pain at 15:15 and arrived at [facility]  emergency room at 16:16.  The first balloon inflation was at 18:06.  This  gave a door-to-balloon time of 1 hour 50 minutes or 110 minutes, and a  reperfusion time 2 hours 51 minutes.   I reviewed the films with Dr. Riley Kill and we think the LAD lesion is  probably old.  We might give some attempt at revascularization of this  percutaneously if the patient does not demonstrate ischemia.  We will  consider doing a Cardiolite scan on the patient several weeks out from his  infarct.                                               Charlies Constable, M.D.    BB/MEDQ  D:  01/05/2004  T:  01/06/2004  Job:  [id number]   cc:   Windle Guard, M.D.  [address]  [place], [place] [address]  Fax: [phone]  Cardiopulmonary Laboratory

## 2011-10-10 ENCOUNTER — Encounter: Payer: Self-pay | Admitting: Cardiovascular Disease

## 2011-10-11 ENCOUNTER — Encounter: Payer: Self-pay | Admitting: Cardiovascular Disease

## 2011-10-11 ENCOUNTER — Ambulatory Visit (INDEPENDENT_AMBULATORY_CARE_PROVIDER_SITE_OTHER): Payer: BC Managed Care – PPO | Admitting: Cardiovascular Disease

## 2011-10-11 VITALS — BP 126/82 | HR 68 | Ht 70.0 in | Wt 208.0 lb

## 2011-10-11 DIAGNOSIS — I10 Essential (primary) hypertension: Secondary | ICD-10-CM

## 2011-10-11 DIAGNOSIS — I2510 Atherosclerotic heart disease of native coronary artery without angina pectoris: Secondary | ICD-10-CM

## 2011-10-11 NOTE — Progress Notes (Signed)
History of Present Illness:60 yo WM with history of CAD, HTN, HLD, DM here today for cardiac follow up. He has been followed in the past by Dr. Juanda Chance. 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 There was an unsuccessful attempt at PCI of a chronically occluded LAD.   He's done quite well since that time with no chest pain,  palpitations or significant shortness of breath.    His primary care is Windle Guard. Lipids are followed in primary care. He is divorced and has two children. He works for [employer] with [employer] and it is  a strenuous job.    Past Medical History  Diagnosis Date  . Coronary artery disease   . Hyperlipidemia, mixed   . Hypertension   . Hypothyroidism   . Diabetes mellitus     No past surgical history on file.  Current Outpatient Prescriptions  Medication Sig Dispense Refill  . amLODipine (NORVASC) 10 MG tablet Take 10 mg by mouth daily.        Marland Kitchen aspirin 325 MG tablet Take 325 mg by mouth daily.        . carvedilol (COREG) 12.5 MG tablet Take 1 tablet (12.5 mg total) by mouth 2 (two) times daily.  60 tablet  6  . metFORMIN (GLUCOPHAGE) 1000 MG tablet Take 1,000 mg by mouth 2 (two) times daily with a meal.        . multivitamin (THERAGRAN) per tablet Take 1 tablet by mouth daily.        . quinapril (ACCUPRIL) 40 MG tablet Take 40 mg by mouth daily. Take 2 tablets by mouth once daily.       . simvastatin (ZOCOR) 40 MG tablet Take 40 mg by mouth at bedtime.          No Known Allergies  History   Social History  . Marital Status: Married    Spouse Name: N/A    Number of Children: N/A  . Years of Education: N/A   Occupational History  .  Other   Social History Main Topics  . Smoking status: Former Smoker    Quit date: 10/11/2007  . Smokeless tobacco: Not on file  . Alcohol Use: No  . Drug Use: No  . Sexually Active: Not on file   Other Topics Concern  . Not on file   Social History Narrative    . No narrative on file    No family history on file.  Review of Systems:  As stated in the HPI and otherwise negative.   BP 126/82  Pulse 68  Ht 5\' 10"  (1.778 m)  Wt 208 lb (94.348 kg)  BMI 29.84 kg/m2  Physical Examination: General: Well developed, well nourished, NAD HEENT: OP clear, mucus membranes moist SKIN: warm, dry. No rashes. Neuro: No focal deficits Musculoskeletal: Muscle strength 5/5 all ext Psychiatric: Mood and affect normal Neck: No JVD, no carotid bruits, no thyromegaly, no lymphadenopathy. Lungs:Clear bilaterally, no wheezes, rhonci, crackles Cardiovascular: Regular rate and rhythm. No murmurs, gallops or rubs. Abdomen:Soft. Bowel sounds present. Non-tender.  Extremities: No lower extremity edema. Pulses are 2 + in the bilateral DP/PT.  EKG:NSR, Unchanged, old inferior infarct. Subtle ST elevation throughout.

## 2011-10-11 NOTE — Assessment & Plan Note (Signed)
BP well controlled. NO changes.

## 2011-10-11 NOTE — Assessment & Plan Note (Signed)
Stable. No changes. He is on simvastatin and Norvasc but he has been on this for years and no problem so I will not change.

## 2011-10-11 NOTE — Patient Instructions (Signed)
Your physician wants you to follow-up in:  12 months.  You will receive a reminder letter in the mail two months in advance. If you don't receive a letter, please call our office to schedule the follow-up appointment.   

## 2011-10-23 ENCOUNTER — Other Ambulatory Visit: Payer: Self-pay | Admitting: Cardiovascular Disease

## 2011-10-24 MED ORDER — QUINAPRIL HCL 40 MG PO TABS: 40.0000 mg | ORAL_TABLET | Freq: Every day | ORAL | Status: DC

## 2011-10-24 MED ORDER — SIMVASTATIN 40 MG PO TABS: 40.0000 mg | ORAL_TABLET | Freq: Every day | ORAL | Status: DC

## 2011-10-24 MED ORDER — QUINAPRIL HCL 40 MG PO TABS: ORAL_TABLET | ORAL | Status: DC

## 2011-10-24 MED ORDER — AMLODIPINE BESYLATE 10 MG PO TABS: 10.0000 mg | ORAL_TABLET | Freq: Every day | ORAL | Status: DC

## 2011-10-24 MED ORDER — CARVEDILOL 12.5 MG PO TABS: 12.5000 mg | ORAL_TABLET | Freq: Two times a day (BID) | ORAL | Status: DC

## 2011-10-24 NOTE — Telephone Encounter (Signed)
I called and spoke with pharmacist at [facility] and clarified directions for Accupril. Pt takes Accupril 40 mg 2 tablets by mouth daily

## 2011-10-24 NOTE — Telephone Encounter (Signed)
I sent requested refills and left message for pt that refills had been sent.

## 2011-10-24 NOTE — Telephone Encounter (Signed)
Pt calling stating he needs refills called into [facility] on [address] in [place]. Pt said pharmacy has faxed request for RX two times with no response.  Pt needs carvedilol, amlodipine , simvastatin, and quinapril. Pt is completely out simvastatin. Please call these Rx's in for a refill.

## 2011-10-25 ENCOUNTER — Other Ambulatory Visit: Payer: Self-pay | Admitting: *Deleted

## 2011-10-25 MED ORDER — QUINAPRIL HCL 40 MG PO TABS: ORAL_TABLET | ORAL | Status: DC

## 2012-01-01 ENCOUNTER — Ambulatory Visit
Admission: RE | Admit: 2012-01-01 | Discharge: 2012-01-01 | Disposition: A | Discharge status: Home / Self Care | Payer: BC Managed Care – PPO | Source: Ambulatory Visit | Attending: Family Medicine | Admitting: Family Medicine

## 2012-01-01 ENCOUNTER — Other Ambulatory Visit: Payer: Self-pay | Admitting: Family Medicine

## 2012-01-01 DIAGNOSIS — R1032 Left lower quadrant pain: Secondary | ICD-10-CM

## 2012-10-18 ENCOUNTER — Other Ambulatory Visit: Payer: Self-pay | Admitting: Cardiovascular Disease

## 2012-10-29 ENCOUNTER — Other Ambulatory Visit: Payer: Self-pay | Admitting: Cardiovascular Disease

## 2012-11-05 ENCOUNTER — Other Ambulatory Visit: Payer: Self-pay | Admitting: Cardiovascular Disease

## 2012-11-13 ENCOUNTER — Ambulatory Visit (INDEPENDENT_AMBULATORY_CARE_PROVIDER_SITE_OTHER): Payer: BC Managed Care – PPO | Admitting: Cardiovascular Disease

## 2012-11-13 ENCOUNTER — Encounter: Payer: Self-pay | Admitting: Cardiovascular Disease

## 2012-11-13 VITALS — BP 138/88 | HR 71 | Ht 70.0 in | Wt 216.4 lb

## 2012-11-13 DIAGNOSIS — I2510 Atherosclerotic heart disease of native coronary artery without angina pectoris: Secondary | ICD-10-CM

## 2012-11-13 MED ORDER — ASPIRIN 81 MG PO TABS: 325.0000 mg | ORAL_TABLET | Freq: Every day | ORAL | Status: DC

## 2012-11-13 MED ORDER — AMLODIPINE BESYLATE 10 MG PO TABS: 10.0000 mg | ORAL_TABLET | Freq: Every day | ORAL | Status: DC

## 2012-11-13 MED ORDER — CARVEDILOL 12.5 MG PO TABS: 12.5000 mg | ORAL_TABLET | Freq: Two times a day (BID) | ORAL | Status: DC

## 2012-11-13 MED ORDER — QUINAPRIL HCL 40 MG PO TABS: ORAL_TABLET | ORAL | Status: DC

## 2012-11-13 MED ORDER — ASPIRIN 81 MG PO TABS: 81.0000 mg | ORAL_TABLET | Freq: Every day | ORAL | Status: DC

## 2012-11-13 MED ORDER — SIMVASTATIN 40 MG PO TABS: 40.0000 mg | ORAL_TABLET | Freq: Every day | ORAL | Status: DC

## 2012-11-13 NOTE — Patient Instructions (Signed)
Your physician wants you to follow-up in:  12 months. You will receive a reminder letter in the mail two months in advance. If you don't receive a letter, please call our office to schedule the follow-up appointment.  Your physician has requested that you have an exercise tolerance test with NP or PA.  For further information please visit [email or web address] Please also follow instruction sheet, as given.   Your physician has recommended you make the following change in your medication:  Decease aspirin to 81 mg by mouth daily.

## 2012-11-13 NOTE — Progress Notes (Signed)
History of Present Illness: 61 yo WM with history of CAD, HTN, HLD, DM here today for cardiac follow up. He has been followed in the past by Dr. Juanda Chance. 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 There was an unsuccessful attempt at PCI of a chronically occluded LAD.  He's done quite well since that time with no chest pain, palpitations or significant shortness of breath. He is divorced and has two children. He works for [employer] with [employer] and it is a strenuous job.   He is here today for follow up. He denies chest pain, SOB or palpitations.  Still working full time without limitations.   Primary Care Physician: Windle Guard  Last Lipid Profile: Followed in primary care  Past Medical History  Diagnosis Date  . Coronary artery disease   . Hyperlipidemia, mixed   . Hypertension   . Hypothyroidism   . Diabetes mellitus     Past Surgical History  Procedure Date  . Wisdom tooth extraction     Current Outpatient Prescriptions  Medication Sig Dispense Refill  . amLODipine (NORVASC) 10 MG tablet TAKE ONE TABLET BY MOUTH EVERY DAY  30 tablet  5  . aspirin 325 MG tablet Take 325 mg by mouth daily.        . carvedilol (COREG) 12.5 MG tablet Take 1 tablet (12.5 mg total) by mouth 2 (two) times daily with a meal.  60 tablet  11  . carvedilol (COREG) 12.5 MG tablet TAKE ONE TABLET BY MOUTH TWICE DAILY  60 tablet  0  . metFORMIN (GLUCOPHAGE) 1000 MG tablet Take 1,000 mg by mouth 2 (two) times daily with a meal.        . multivitamin (THERAGRAN) per tablet Take 1 tablet by mouth daily.        . quinapril (ACCUPRIL) 40 MG tablet Take 2 tablets by mouth daily  60 tablet  11  . simvastatin (ZOCOR) 40 MG tablet TAKE ONE TABLET BY MOUTH AT BEDTIME  30 tablet  1    No Known Allergies  History   Social History  . Marital Status: Married    Spouse Name: N/A    Number of Children: N/A  . Years of Education: N/A   Occupational History    .  Other   Social History Main Topics  . Smoking status: Former Smoker    Quit date: 10/11/2007  . Smokeless tobacco: Not on file  . Alcohol Use: No  . Drug Use: No  . Sexually Active: Not on file   Other Topics Concern  . Not on file   Social History Narrative  . No narrative on file    Family History  Problem Relation Age of Onset  . Coronary artery disease Father 26    Review of Systems:  As stated in the HPI and otherwise negative.   BP 138/88  Pulse 71  Ht 5\' 10"  (1.778 m)  Wt 216 lb 6.4 oz (98.158 kg)  BMI 31.05 kg/m2  Physical Examination: General: Well developed, well nourished, NAD HEENT: OP clear, mucus membranes moist SKIN: warm, dry. No rashes. Neuro: No focal deficits Musculoskeletal: Muscle strength 5/5 all ext Psychiatric: Mood and affect normal Neck: No JVD, no carotid bruits, no thyromegaly, no lymphadenopathy. Lungs:Clear bilaterally, no wheezes, rhonci, crackles Cardiovascular: Regular rate and rhythm. No murmurs, gallops or rubs. Abdomen:Soft. Bowel sounds present. Non-tender.  Extremities: No lower extremity edema. Pulses are 2 + in  the bilateral DP/PT.  EKG: NSR, rate 71 bpm. Q-waves lateral and inferior leads, unchanged from EKG in 2012.  Assessment and Plan:   1. CORONARY ARTERY DISEASE: Stable. He is on simvastatin and Norvasc but he has been on this for years and no problem so I will not change. Will lower ASA dose to 81 mg po Qdaily. Will arrange treadmill exercise stress test since he has had no screening stress test for 7 years. Lipids followed in primary care.   2. HYPERTENSION: BP well controlled. No changes.

## 2013-01-29 ENCOUNTER — Encounter: Payer: BC Managed Care – PPO | Admitting: Physician Assistant

## 2013-11-15 ENCOUNTER — Other Ambulatory Visit: Payer: Self-pay | Admitting: Cardiovascular Disease

## 2013-12-02 ENCOUNTER — Ambulatory Visit: Payer: BC Managed Care – PPO | Admitting: Cardiovascular Disease

## 2013-12-12 ENCOUNTER — Other Ambulatory Visit: Payer: Self-pay | Admitting: Cardiovascular Disease

## 2013-12-31 ENCOUNTER — Encounter: Payer: Self-pay | Admitting: Cardiovascular Disease

## 2013-12-31 ENCOUNTER — Ambulatory Visit (INDEPENDENT_AMBULATORY_CARE_PROVIDER_SITE_OTHER): Payer: BC Managed Care – PPO | Admitting: Cardiovascular Disease

## 2013-12-31 VITALS — BP 138/92 | HR 75 | Ht 71.0 in | Wt 219.0 lb

## 2013-12-31 DIAGNOSIS — I2510 Atherosclerotic heart disease of native coronary artery without angina pectoris: Secondary | ICD-10-CM

## 2013-12-31 MED ORDER — QUINAPRIL HCL 40 MG PO TABS: ORAL_TABLET | ORAL | Status: DC

## 2013-12-31 MED ORDER — SIMVASTATIN 40 MG PO TABS: ORAL_TABLET | ORAL | Status: DC

## 2013-12-31 MED ORDER — CARVEDILOL 12.5 MG PO TABS: ORAL_TABLET | ORAL | Status: DC

## 2013-12-31 MED ORDER — AMLODIPINE BESYLATE 10 MG PO TABS: ORAL_TABLET | ORAL | Status: DC

## 2013-12-31 NOTE — Progress Notes (Signed)
History of Present Illness: 63 yo WM with history of CAD, HTN, HLD, DM here today for cardiac follow up. He has been followed in the past by Dr. Olevia Perches. 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 There was an unsuccessful attempt at PCI of a chronically occluded LAD. He works for [employer] with [employer] and it is a strenuous job.   He is here today for follow up. He denies chest pain, SOB or palpitations.  Still working full time without limitations. We had discussed an exercise stress test at the last visit but he cancelled.   Primary Care Physician: Claris Gower  Last Lipid Profile: Followed in primary care  Past Medical History  Diagnosis Date  . Coronary artery disease   . Hyperlipidemia, mixed   . Hypertension   . Hypothyroidism   . Diabetes mellitus   . History of nephrolithiasis     Past Surgical History  Procedure Laterality Date  . Wisdom tooth extraction      Current Outpatient Prescriptions  Medication Sig Dispense Refill  . amLODipine (NORVASC) 10 MG tablet TAKE ONE TABLET BY MOUTH EVERY DAY  30 tablet  1  . aspirin 81 MG tablet Take 1 tablet (81 mg total) by mouth daily.  30 tablet  6  . carvedilol (COREG) 12.5 MG tablet TAKE ONE TABLET BY MOUTH TWICE DAILY WITH A MEAL  60 tablet  1  . metFORMIN (GLUCOPHAGE) 1000 MG tablet Take 1,000 mg by mouth 2 (two) times daily with a meal.        . multivitamin (THERAGRAN) per tablet Take 1 tablet by mouth daily.        . quinapril (ACCUPRIL) 40 MG tablet TAKE TWO TABLETS BY MOUTH EVERY DAY  60 tablet  1  . simvastatin (ZOCOR) 40 MG tablet TAKE ONE TABLET BY MOUTH AT BEDTIME  30 tablet  0   No current facility-administered medications for this visit.    No Known Allergies  History   Social History  . Marital Status: Married    Spouse Name: N/A    Number of Children: 2  . Years of Education: N/A   Occupational History  . [employer] Other  .      Social History Main Topics  . Smoking status: Former Smoker -- 0.50 packs/day for 20 years    Types: Cigarettes    Quit date: 10/11/2007  . Smokeless tobacco: Not on file  . Alcohol Use: No  . Drug Use: No  . Sexual Activity: Not on file   Other Topics Concern  . Not on file   Social History Narrative  . No narrative on file    Family History  Problem Relation Age of Onset  . Coronary artery disease Father 62    Review of Systems:  As stated in the HPI and otherwise negative.   BP 138/92  Pulse 75  Ht 5\' 11"  (1.803 m)  Wt 219 lb (99.338 kg)  BMI 30.56 kg/m2  Physical Examination: General: Well developed, well nourished, NAD HEENT: OP clear, mucus membranes moist SKIN: warm, dry. No rashes. Neuro: No focal deficits Musculoskeletal: Muscle strength 5/5 all ext Psychiatric: Mood and affect normal Neck: No JVD, no carotid bruits, no thyromegaly, no lymphadenopathy. Lungs:Clear bilaterally, no wheezes, rhonci, crackles Cardiovascular: Regular rate and rhythm. No murmurs, gallops or rubs. Abdomen:Soft. Bowel sounds present. Non-tender.  Extremities: No lower extremity edema. Pulses are 2 + in  the bilateral DP/PT.  EKG: NSR, rate 75 bpm. Q-waves lateral and inferior leads, unchanged from EKG in 2012. Non-specific T wave abnormalities in the inferior leads.   Assessment and Plan:   1. CORONARY ARTERY DISEASE: Stable. He is on simvastatin and Norvasc but he has been on this for years and no problem so I will not change.  Lipids followed in primary care. BP controlled. Will refill meds today. Discussed adding Plavix based on recent findings in DAPT trial but he has had some hematuria likely due to kidney stones. Will wait until workup is complete for hematuria before starting Plavix.   2. HYPERTENSION: BP well controlled at home.  No changes.

## 2013-12-31 NOTE — Patient Instructions (Addendum)
Your physician wants you to follow-up in:  12 months. You will receive a reminder letter in the mail two months in advance. If you don't receive a letter, please call our office to schedule the follow-up appointment.  Your physician has requested that you have an exercise tolerance test. Can be done with NP or PA.  For further information please visit [email or web address]. Please also follow instruction sheet, as given.

## 2014-02-11 ENCOUNTER — Ambulatory Visit (INDEPENDENT_AMBULATORY_CARE_PROVIDER_SITE_OTHER): Payer: BC Managed Care – PPO | Admitting: Physician Assistant

## 2014-02-11 ENCOUNTER — Encounter (INDEPENDENT_AMBULATORY_CARE_PROVIDER_SITE_OTHER): Payer: Self-pay

## 2014-02-11 DIAGNOSIS — I2510 Atherosclerotic heart disease of native coronary artery without angina pectoris: Secondary | ICD-10-CM

## 2014-02-11 NOTE — Progress Notes (Signed)
Exercise Treadmill Test  Pre-Exercise Testing Evaluation Rhythm: normal sinus  Rate: 78 bpm     Test  Exercise Tolerance Test Ordering MD: Murvin Natal, MD  Interpreting MD: Richardson Dopp, PA-C  Unique Test No: 1  Treadmill:  1  Indication for ETT: known ASHD  Contraindication to ETT: No   Stress Modality: exercise - treadmill  Cardiac Imaging Performed: non   Protocol: standard Bruce - maximal  Max BP:  228/107  Max MPHR (bpm):  160 85% MPR (bpm):  136  MPHR obtained (bpm):  139 % MPHR obtained:  86  Reached 85% MPHR (min:sec):  8:07 Total Exercise Time (min-sec):  9:00  Workload in METS:  10.1 Borg Scale: 16  Reason ETT Terminated:  exaggerated hypertensive response    ST Segment Analysis At Rest: normal ST segments - no evidence of significant ST depression - PVCs With Exercise: non-specific ST changes  Other Information Arrhythmia:  No Angina during ETT:  absent (0) Quality of ETT:  diagnostic  ETT Interpretation:  normal - no evidence of ischemia by ST analysis  Comments: Good exercise capacity. No chest pain. Exaggerated hypertensive BP response to exercise. No significant ST changes to suggest ischemia.   Recommendations: F/u with Dr. Lauree Chandler as directed. Signed,  Richardson Dopp, PA-C   02/11/2014 9:54 AM

## 2014-12-02 ENCOUNTER — Encounter: Payer: Self-pay | Admitting: Cardiology

## 2014-12-02 ENCOUNTER — Other Ambulatory Visit: Payer: Self-pay | Admitting: Nurse Practitioner

## 2014-12-02 ENCOUNTER — Ambulatory Visit (INDEPENDENT_AMBULATORY_CARE_PROVIDER_SITE_OTHER): Payer: BC Managed Care – PPO | Admitting: Cardiology

## 2014-12-02 ENCOUNTER — Other Ambulatory Visit: Payer: Self-pay | Admitting: Cardiology

## 2014-12-02 ENCOUNTER — Telehealth: Payer: Self-pay | Admitting: Cardiovascular Disease

## 2014-12-02 VITALS — BP 138/88 | HR 79 | Ht 71.0 in | Wt 225.6 lb

## 2014-12-02 DIAGNOSIS — R079 Chest pain, unspecified: Secondary | ICD-10-CM

## 2014-12-02 DIAGNOSIS — I10 Essential (primary) hypertension: Secondary | ICD-10-CM

## 2014-12-02 DIAGNOSIS — I208 Other forms of angina pectoris: Secondary | ICD-10-CM

## 2014-12-02 DIAGNOSIS — E118 Type 2 diabetes mellitus with unspecified complications: Secondary | ICD-10-CM

## 2014-12-02 DIAGNOSIS — E782 Mixed hyperlipidemia: Secondary | ICD-10-CM

## 2014-12-02 LAB — CBC WITH DIFFERENTIAL/PLATELET
Basophils Absolute: 0.1 10*3/uL (ref 0.0–0.1)
Basophils Relative: 1 % (ref 0–1)
Eosinophils Absolute: 0.5 10*3/uL (ref 0.0–0.7)
Eosinophils Relative: 9 % — ABNORMAL HIGH (ref 0–5)
HCT: 41 % (ref 39.0–52.0)
Hemoglobin: 14.2 g/dL (ref 13.0–17.0)
Lymphocytes Relative: 19 % (ref 12–46)
Lymphs Abs: 1.1 10*3/uL (ref 0.7–4.0)
MCH: 29.4 pg (ref 26.0–34.0)
MCHC: 34.6 g/dL (ref 30.0–36.0)
MCV: 84.9 fL (ref 78.0–100.0)
MPV: 8.6 fL — ABNORMAL LOW (ref 9.4–12.4)
Monocytes Absolute: 0.6 10*3/uL (ref 0.1–1.0)
Monocytes Relative: 10 % (ref 3–12)
Neutro Abs: 3.7 10*3/uL (ref 1.7–7.7)
Neutrophils Relative %: 61 % (ref 43–77)
Platelets: 232 10*3/uL (ref 150–400)
RBC: 4.83 MIL/uL (ref 4.22–5.81)
RDW: 13.6 % (ref 11.5–15.5)
WBC: 6 10*3/uL (ref 4.0–10.5)

## 2014-12-02 LAB — BASIC METABOLIC PANEL
BUN: 15 mg/dL (ref 6–23)
CO2: 24 mEq/L (ref 19–32)
Calcium: 9.5 mg/dL (ref 8.4–10.5)
Chloride: 102 mEq/L (ref 96–112)
Creat: 0.9 mg/dL (ref 0.50–1.35)
Glucose, Bld: 142 mg/dL — ABNORMAL HIGH (ref 70–99)
Potassium: 3.9 mEq/L (ref 3.5–5.3)
Sodium: 140 mEq/L (ref 135–145)

## 2014-12-02 MED ORDER — NITROGLYCERIN 0.4 MG SL SUBL: 0.4000 mg | SUBLINGUAL_TABLET | SUBLINGUAL | Status: DC | PRN

## 2014-12-02 MED ORDER — ISOSORBIDE MONONITRATE ER 30 MG PO TB24: 30.0000 mg | ORAL_TABLET | Freq: Every day | ORAL | Status: DC

## 2014-12-02 NOTE — Assessment & Plan Note (Signed)
Controlled.  

## 2014-12-02 NOTE — Progress Notes (Signed)
12/02/2014 Zachary Ellis   15-Jan-1951  [id number]  Primary Physician [name], MD Primary Cardiologist: Dr Julianne Handler  HPI:  63 y/o male, works for a [employer], with a history of prior MI, CFX PCI in 2005. He also apparently had an attempt at an LAD PCI that was unsuccessful. He has done well from a cardiac standpoint. He had a negative treadmill in Feb 2015. He is in the office today as an add on for exertional chest tightness for the past two weeks. He describes exertional tightness and dyspnea. His symptoms are relieved with rest. He admits to radiation down both arms. He denies any rest symptoms.    Current Outpatient Prescriptions  Medication Sig Dispense Refill  . amLODipine (NORVASC) 10 MG tablet TAKE ONE TABLET BY MOUTH EVERY DAY 30 tablet 11  . aspirin 81 MG tablet Take 1 tablet (81 mg total) by mouth daily. 30 tablet 6  . carvedilol (COREG) 12.5 MG tablet TAKE ONE TABLET BY MOUTH TWICE DAILY WITH A MEAL 60 tablet 11  . metFORMIN (GLUCOPHAGE) 1000 MG tablet Take 1,000 mg by mouth 2 (two) times daily with a meal. Take 1 tablet in the AM and 1/2 tablet in the PM    . multivitamin (THERAGRAN) per tablet Take 1 tablet by mouth daily.      . quinapril (ACCUPRIL) 40 MG tablet TAKE TWO TABLETS BY MOUTH EVERY DAY (Patient taking differently: Take 40 mg by mouth 2 (two) times daily. TAKE TWO TABLETS BY MOUTH EVERY DAY) 60 tablet 11  . simvastatin (ZOCOR) 40 MG tablet TAKE ONE TABLET BY MOUTH AT BEDTIME 30 tablet 11  . isosorbide mononitrate (IMDUR) 30 MG 24 hr tablet Take 1 tablet (30 mg total) by mouth daily. 30 tablet 5  . nitroGLYCERIN (NITROSTAT) 0.4 MG SL tablet Place 1 tablet (0.4 mg total) under the tongue every 5 (five) minutes as needed. 25 tablet 3   No current facility-administered medications for this visit.  PMH- CAD, HTN, nephrolithiasis, Type 2 DM, dyslipidemia  No Known Allergies  History   Social History  . Marital Status: Married    Spouse  Name: N/A    Number of Children: 2  . Years of Education: N/A   Occupational History  . [employer] Other  .     Social History Main Topics  . Smoking status: Former Smoker -- 0.50 packs/day for 20 years    Types: Cigarettes    Quit date: 10/11/2007  . Smokeless tobacco: Not on file  . Alcohol Use: No  . Drug Use: No  . Sexual Activity: Not on file   Other Topics Concern  . Not on file   Social History Narrative   FMHx- his father had CAD and CHF  Review of Systems: General: negative for chills, fever, night sweats or weight changes.  Cardiovascular: negative for edema, orthopnea, palpitations, paroxysmal nocturnal dyspnea or shortness of breath Dermatological: negative for rash Respiratory: negative for cough or wheezing Urologic: negative for hematuria Abdominal: negative for nausea, vomiting, diarrhea, bright red blood per rectum, melena, or hematemesis Neurologic: negative for visual changes, syncope, or dizziness All other systems reviewed and are otherwise negative except as noted above.    Blood pressure 138/88, pulse 79, height 5\' 11"  (1.803 m), weight 225 lb 9.6 oz (102.331 kg).  General appearance: alert, cooperative and no distress Neck: no carotid bruit and no JVD Lungs: clear to auscultation bilaterally Heart: regular rate and rhythm Abdomen: soft, non-tender; bowel sounds  normal; no masses,  no organomegaly Extremities: extremities normal, atraumatic, no cyanosis or edema Pulses: 2+ and symmetric Skin: Skin color, texture, turgor normal. No rashes or lesions Neurologic: Grossly normal  EKG NSR with NSST changes, inf, post Qs- similar to prior EKG  ASSESSMENT AND PLAN:   Exertional angina Pt is experiencing exertional angina for the past two weeks.   Essential hypertension Controlled  Type 2 diabetes  On oral agents  HYPERLIPIDEMIA, MIXED On statin   PLAN  Reviewed with Dr Radford Pax in the office. He is totally asymptomatic at  present. We will Rx prn SL NTG, add Imdur 30 mg and schedule cath next week with Dr Julianne Handler. He should stay out of work till this get evaluated.   [facility] KPA-C 12/02/2014 4:47 PM

## 2014-12-02 NOTE — Assessment & Plan Note (Signed)
Pt is experiencing exertional angina for the past two weeks.

## 2014-12-02 NOTE — Patient Instructions (Addendum)
Your physician has recommended you make the following change in your medication:  1) START Nitro-glycerin as needed. Use as directed. An Rx has been sent to your pharmacy 2) START Isosorbide 30mg  daily. An Rx has been sent to your pharmacy  Lab Today: [place], [place] physician has requested that you have a cardiac catheterization. Cardiac catheterization is used to diagnose and/or treat various heart conditions. Doctors may recommend this procedure for a number of different reasons. The most common reason is to evaluate chest pain. Chest pain can be a symptom of coronary artery disease (CAD), and cardiac catheterization can show whether plaque is narrowing or blocking your heart's arteries. This procedure is also used to evaluate the valves, as well as measure the blood flow and oxygen levels in different parts of your heart. For further information please visit [email or web address]. Please follow instruction sheet, as given.    Nitroglycerin sublingual tablets What is this medicine? NITROGLYCERIN (nye troe GLI ser in) is a type of vasodilator. It relaxes blood vessels, increasing the blood and oxygen supply to your heart. This medicine is used to relieve chest pain caused by angina. It is also used to prevent chest pain before activities like climbing stairs, going outdoors in cold weather, or sexual activity. This medicine may be used for other purposes; ask your health care provider or pharmacist if you have questions. COMMON BRAND NAME(S): Nitroquick, Nitrostat, Nitrotab What should I tell my health care provider before I take this medicine? They need to know if you have any of these conditions: -anemia -head injury, recent stroke, or bleeding in the brain -liver disease -previous heart attack -an unusual or allergic reaction to nitroglycerin, other medicines, foods, dyes, or preservatives -pregnant or trying to get pregnant -breast-feeding How should I use this medicine? Take this  medicine by mouth as needed. At the first sign of an angina attack (chest pain or tightness) place one tablet under your tongue. You can also take this medicine 5 to 10 minutes before an event likely to produce chest pain. Follow the directions on the prescription label. Let the tablet dissolve under the tongue. Do not swallow whole. Replace the dose if you accidentally swallow it. It will help if your mouth is not dry. Saliva around the tablet will help it to dissolve more quickly. Do not eat or drink, smoke or chew tobacco while a tablet is dissolving. If you are not better within 5 minutes after taking ONE dose of nitroglycerin, call 9-1-1 immediately to seek emergency medical care. Do not take more than 3 nitroglycerin tablets over 15 minutes. If you take this medicine often to relieve symptoms of angina, your doctor or health care professional may provide you with different instructions to manage your symptoms. If symptoms do not go away after following these instructions, it is important to call 9-1-1 immediately. Do not take more than 3 nitroglycerin tablets over 15 minutes. Talk to your pediatrician regarding the use of this medicine in children. Special care may be needed. Overdosage: If you think you have taken too much of this medicine contact a poison control center or emergency room at once. NOTE: This medicine is only for you. Do not share this medicine with others. What if I miss a dose? This does not apply. This medicine is only used as needed. What may interact with this medicine? Do not take this medicine with any of the following medications: -certain migraine medicines like ergotamine and dihydroergotamine (DHE) -medicines used to treat erectile dysfunction  like sildenafil, tadalafil, and vardenafil -riociguat This medicine may also interact with the following medications: -alteplase -aspirin -heparin -medicines for high blood pressure -medicines for mental depression -other  medicines used to treat angina -phenothiazines like chlorpromazine, mesoridazine, prochlorperazine, thioridazine This list may not describe all possible interactions. Give your health care provider a list of all the medicines, herbs, non-prescription drugs, or dietary supplements you use. Also tell them if you smoke, drink alcohol, or use illegal drugs. Some items may interact with your medicine. What should I watch for while using this medicine? Tell your doctor or health care professional if you feel your medicine is no longer working. Keep this medicine with you at all times. Sit or lie down when you take your medicine to prevent falling if you feel dizzy or faint after using it. Try to remain calm. This will help you to feel better faster. If you feel dizzy, take several deep breaths and lie down with your feet propped up, or bend forward with your head resting between your knees. You may get drowsy or dizzy. Do not drive, use machinery, or do anything that needs mental alertness until you know how this drug affects you. Do not stand or sit up quickly, especially if you are an older patient. This reduces the risk of dizzy or fainting spells. Alcohol can make you more drowsy and dizzy. Avoid alcoholic drinks. Do not treat yourself for coughs, colds, or pain while you are taking this medicine without asking your doctor or health care professional for advice. Some ingredients may increase your blood pressure. What side effects may I notice from receiving this medicine? Side effects that you should report to your doctor or health care professional as soon as possible: -blurred vision -dry mouth -skin rash -sweating -the feeling of extreme pressure in the head -unusually weak or tired Side effects that usually do not require medical attention (report to your doctor or health care professional if they continue or are bothersome): -flushing of the face or neck -headache -irregular heartbeat,  palpitations -nausea, vomiting This list may not describe all possible side effects. Call your doctor for medical advice about side effects. You may report side effects to FDA at 1-800-FDA-1088. Where should I keep my medicine? Keep out of the reach of children. Store at room temperature between 20 and 25 degrees C (68 and 77 degrees F). Store in [employer]. Protect from light and moisture. Keep tightly closed. Throw away any unused medicine after the expiration date. NOTE: This sheet is a summary. It may not cover all possible information. If you have questions about this medicine, talk to your doctor, pharmacist, or health care provider.  2015, Elsevier/Gold Standard. (2013-10-07 17:57:36)

## 2014-12-02 NOTE — Telephone Encounter (Signed)
New message     Calling from PCP office.  Pt has new onset CHF and needs to be seen asap.

## 2014-12-02 NOTE — Assessment & Plan Note (Signed)
On statin.

## 2014-12-02 NOTE — Assessment & Plan Note (Signed)
On oral agents 

## 2014-12-02 NOTE — Telephone Encounter (Signed)
Appointment today with Kerin Ransom at 2:00pm They have informed patient.

## 2014-12-02 NOTE — Telephone Encounter (Signed)
Received phone call from Dr. Claris Gower, PCP of patient. He is there now, and having dyspnea on exertion. They have done EKG and are drawing labs now. Inquiring if he can be seen urgently today. Will speak with [facility] provider, Kerin Ransom NP.

## 2014-12-07 ENCOUNTER — Inpatient Hospital Stay (HOSPITAL_COMMUNITY)
Admission: RE | Admit: 2014-12-07 | Discharge: 2014-12-13 | DRG: 233 | Disposition: A | Discharge status: Home / Self Care | Payer: BC Managed Care – PPO | Source: Ambulatory Visit | Attending: Thoracic Surgery (Cardiothoracic Vascular Surgery) | Admitting: Thoracic Surgery (Cardiothoracic Vascular Surgery)

## 2014-12-07 ENCOUNTER — Encounter (HOSPITAL_COMMUNITY)
Admission: RE | Disposition: A | Discharge status: Home / Self Care | Payer: Self-pay | Source: Ambulatory Visit | Attending: Thoracic Surgery (Cardiothoracic Vascular Surgery)

## 2014-12-07 ENCOUNTER — Encounter (HOSPITAL_COMMUNITY): Payer: Self-pay | Admitting: General Practice

## 2014-12-07 ENCOUNTER — Other Ambulatory Visit: Payer: Self-pay | Admitting: *Deleted

## 2014-12-07 DIAGNOSIS — Z7982 Long term (current) use of aspirin: Secondary | ICD-10-CM

## 2014-12-07 DIAGNOSIS — Z8249 Family history of ischemic heart disease and other diseases of the circulatory system: Secondary | ICD-10-CM

## 2014-12-07 DIAGNOSIS — I2510 Atherosclerotic heart disease of native coronary artery without angina pectoris: Secondary | ICD-10-CM

## 2014-12-07 DIAGNOSIS — E118 Type 2 diabetes mellitus with unspecified complications: Secondary | ICD-10-CM | POA: Diagnosis present

## 2014-12-07 DIAGNOSIS — I25110 Atherosclerotic heart disease of native coronary artery with unstable angina pectoris: Secondary | ICD-10-CM

## 2014-12-07 DIAGNOSIS — Z87891 Personal history of nicotine dependence: Secondary | ICD-10-CM

## 2014-12-07 DIAGNOSIS — Z79899 Other long term (current) drug therapy: Secondary | ICD-10-CM

## 2014-12-07 DIAGNOSIS — I5021 Acute systolic (congestive) heart failure: Secondary | ICD-10-CM | POA: Diagnosis not present

## 2014-12-07 DIAGNOSIS — E782 Mixed hyperlipidemia: Secondary | ICD-10-CM | POA: Diagnosis present

## 2014-12-07 DIAGNOSIS — E039 Hypothyroidism, unspecified: Secondary | ICD-10-CM | POA: Diagnosis present

## 2014-12-07 DIAGNOSIS — I208 Other forms of angina pectoris: Secondary | ICD-10-CM

## 2014-12-07 DIAGNOSIS — Z951 Presence of aortocoronary bypass graft: Secondary | ICD-10-CM

## 2014-12-07 DIAGNOSIS — I200 Unstable angina: Secondary | ICD-10-CM | POA: Diagnosis present

## 2014-12-07 DIAGNOSIS — E119 Type 2 diabetes mellitus without complications: Secondary | ICD-10-CM | POA: Diagnosis present

## 2014-12-07 DIAGNOSIS — I10 Essential (primary) hypertension: Secondary | ICD-10-CM | POA: Diagnosis present

## 2014-12-07 DIAGNOSIS — D62 Acute posthemorrhagic anemia: Secondary | ICD-10-CM | POA: Diagnosis not present

## 2014-12-07 DIAGNOSIS — Z9861 Coronary angioplasty status: Secondary | ICD-10-CM

## 2014-12-07 DIAGNOSIS — I252 Old myocardial infarction: Secondary | ICD-10-CM

## 2014-12-07 DIAGNOSIS — Z955 Presence of coronary angioplasty implant and graft: Secondary | ICD-10-CM

## 2014-12-07 DIAGNOSIS — J9811 Atelectasis: Secondary | ICD-10-CM | POA: Diagnosis not present

## 2014-12-07 DIAGNOSIS — I255 Ischemic cardiomyopathy: Secondary | ICD-10-CM | POA: Diagnosis present

## 2014-12-07 DIAGNOSIS — R079 Chest pain, unspecified: Secondary | ICD-10-CM

## 2014-12-07 HISTORY — PX: LEFT HEART CATHETERIZATION WITH CORONARY ANGIOGRAM: SHX5451

## 2014-12-07 HISTORY — DX: Presence of aortocoronary bypass graft: Z95.1

## 2014-12-07 HISTORY — DX: Type 2 diabetes mellitus without complications: E11.9

## 2014-12-07 HISTORY — DX: Calculus of kidney: N20.0

## 2014-12-07 HISTORY — PX: CARDIAC CATHETERIZATION: SHX172

## 2014-12-07 LAB — URINALYSIS, ROUTINE W REFLEX MICROSCOPIC
Bilirubin Urine: NEGATIVE
GLUCOSE, UA: 500 mg/dL — AB
Hgb urine dipstick: NEGATIVE
Ketones, ur: NEGATIVE mg/dL
LEUKOCYTES UA: NEGATIVE
NITRITE: NEGATIVE
PROTEIN: NEGATIVE mg/dL
Specific Gravity, Urine: 1.027 (ref 1.005–1.030)
Urobilinogen, UA: 1 mg/dL (ref 0.0–1.0)
pH: 7 (ref 5.0–8.0)

## 2014-12-07 LAB — GLUCOSE, CAPILLARY
Glucose-Capillary: 132 mg/dL — ABNORMAL HIGH (ref 70–99)
Glucose-Capillary: 123 mg/dL — ABNORMAL HIGH (ref 70–99)
Glucose-Capillary: 106 mg/dL — ABNORMAL HIGH (ref 70–99)

## 2014-12-07 LAB — PROTIME-INR
INR: 0.98 (ref 0.00–1.49)
Prothrombin Time: 13.1 seconds (ref 11.6–15.2)

## 2014-12-07 SURGERY — LEFT HEART CATHETERIZATION WITH CORONARY ANGIOGRAM
Anesthesia: LOCAL

## 2014-12-07 MED ORDER — ONDANSETRON HCL 4 MG/2ML IJ SOLN: 4.0000 mg | Freq: Four times a day (QID) | INTRAMUSCULAR | Status: DC | PRN

## 2014-12-07 MED ORDER — ATORVASTATIN CALCIUM 80 MG PO TABS
80.0000 mg | ORAL_TABLET | Freq: Every day | ORAL | Status: DC
  Administered 2014-12-08: 80 mg via ORAL
  Filled 2014-12-07 (×3): qty 1

## 2014-12-07 MED ORDER — ASPIRIN 81 MG PO CHEW
81.0000 mg | CHEWABLE_TABLET | Freq: Every day | ORAL | Status: DC
  Administered 2014-12-08: 81 mg via ORAL
  Filled 2014-12-07: qty 1

## 2014-12-07 MED ORDER — FLUTICASONE PROPIONATE 50 MCG/ACT NA SUSP: 1.0000 | Freq: Every day | NASAL | Status: DC | PRN

## 2014-12-07 MED ORDER — SODIUM CHLORIDE 0.9 % IV SOLN
INTRAVENOUS | Status: DC
  Administered 2014-12-07: 12:00:00 via INTRAVENOUS

## 2014-12-07 MED ORDER — LIDOCAINE HCL (PF) 1 % IJ SOLN
INTRAMUSCULAR | Status: AC
  Filled 2014-12-07: qty 30

## 2014-12-07 MED ORDER — VERAPAMIL HCL 2.5 MG/ML IV SOLN
INTRAVENOUS | Status: AC
  Filled 2014-12-07: qty 2

## 2014-12-07 MED ORDER — NITROGLYCERIN 0.4 MG SL SUBL: 0.4000 mg | SUBLINGUAL_TABLET | SUBLINGUAL | Status: DC | PRN

## 2014-12-07 MED ORDER — CARVEDILOL 12.5 MG PO TABS
12.5000 mg | ORAL_TABLET | Freq: Two times a day (BID) | ORAL | Status: DC
  Administered 2014-12-07 – 2014-12-08 (×3): 12.5 mg via ORAL
  Filled 2014-12-07 (×6): qty 1

## 2014-12-07 MED ORDER — ASPIRIN 81 MG PO CHEW: 81.0000 mg | CHEWABLE_TABLET | ORAL | Status: DC

## 2014-12-07 MED ORDER — ~~LOC~~ CARDIAC SURGERY, PATIENT & FAMILY EDUCATION
Freq: Once | Status: AC
  Administered 2014-12-08: 07:00:00
  Filled 2014-12-07: qty 1

## 2014-12-07 MED ORDER — ACETAMINOPHEN 325 MG PO TABS: 650.0000 mg | ORAL_TABLET | ORAL | Status: DC | PRN

## 2014-12-07 MED ORDER — AMLODIPINE BESYLATE 10 MG PO TABS
10.0000 mg | ORAL_TABLET | Freq: Every day | ORAL | Status: DC
  Administered 2014-12-08: 11:00:00 10 mg via ORAL
  Filled 2014-12-07 (×2): qty 1

## 2014-12-07 MED ORDER — PNEUMOCOCCAL VAC POLYVALENT 25 MCG/0.5ML IJ INJ
0.5000 mL | INJECTION | INTRAMUSCULAR | Status: DC
  Filled 2014-12-07: qty 0.5

## 2014-12-07 MED ORDER — LISINOPRIL 40 MG PO TABS
40.0000 mg | ORAL_TABLET | Freq: Every day | ORAL | Status: DC
  Administered 2014-12-08: 40 mg via ORAL
  Filled 2014-12-07 (×3): qty 1

## 2014-12-07 MED ORDER — SODIUM CHLORIDE 0.9 % IV SOLN
INTRAVENOUS | Status: AC
Start: 2014-12-07 — End: 2014-12-07

## 2014-12-07 MED ORDER — HEPARIN SODIUM (PORCINE) 1000 UNIT/ML IJ SOLN
INTRAMUSCULAR | Status: AC
  Filled 2014-12-07: qty 1

## 2014-12-07 MED ORDER — NITROGLYCERIN 1 MG/10 ML FOR IR/CATH LAB
INTRA_ARTERIAL | Status: AC
  Filled 2014-12-07: qty 10

## 2014-12-07 MED ORDER — ISOSORBIDE MONONITRATE ER 30 MG PO TB24
30.0000 mg | ORAL_TABLET | Freq: Every day | ORAL | Status: DC
  Filled 2014-12-07 (×2): qty 1

## 2014-12-07 MED ORDER — LORATADINE 10 MG PO TABS
10.0000 mg | ORAL_TABLET | Freq: Every day | ORAL | Status: DC
Start: 2014-12-08 — End: 2014-12-09
  Administered 2014-12-08: 11:00:00 10 mg via ORAL
  Filled 2014-12-07 (×2): qty 1

## 2014-12-07 MED ORDER — MIDAZOLAM HCL 2 MG/2ML IJ SOLN
INTRAMUSCULAR | Status: AC
  Filled 2014-12-07: qty 2

## 2014-12-07 MED ORDER — HEPARIN (PORCINE) IN NACL 2-0.9 UNIT/ML-% IJ SOLN
INTRAMUSCULAR | Status: AC
  Filled 2014-12-07: qty 1500

## 2014-12-07 NOTE — Consult Note (Signed)
[address]       [place] [address]             [phone]          CARDIOTHORACIC SURGERY CONSULTATION REPORT  PCP is [name], MD Referring Provider is Burnell Blanks, MD   Reason for consultation:  Severe 3-vessel CAD  HPI:  Patient is a 63 year old white male with known history of coronary artery disease, hypertension, hyperlipidemia, type 2 diabetes mellitus, and remote history of tobacco use who presents with symptoms of new onset exertional angina and has been referred for possible surgical revascularization.  The patient's cardiac history dates back to 2005 when he presented with symptoms of angina pectoris. He was found to have two-vessel coronary artery disease and underwent PCI and stenting of the left circumflex coronary artery followed by delayed attempt at PCI and stenting of the left anterior descending coronary artery.  The patient states that he suffered a mild heart attack after the second PCI procedure but he recovered uneventfully. He has done well from a cardiac standpoint until recently. Approximately 2 weeks ago the patient began to experience classical symptoms of exertional angina. The patient describes substernal chest tightness associated with shortness of breath that brought on with physical activity while at work. Over the past 2 weeks symptoms have accelerated in frequency and severity. He denies any symptoms of chest pain occurring at rest or with minimal activity. He has not had nocturnal angina. He denies any prolonged episodes of chest discomfort that would not resolve with rest.  He was evaluated in the office on 12/02/2014 by Kerin Ransom in Dr. Radford Pax and scheduled for elective diagnostic catheterization. Catheterization performed earlier today by Dr. Angelena Form reveals severe three-vessel coronary artery disease with ischemic cardiomyopathy and moderate left ventricular systolic dysfunction, ejection fraction  estimated at 35%. Cardiothoracic surgical consultation was requested.  The patient is divorced and lives alone in [place].  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 He lives a somewhat sedentary lifestyle but he denies any specific physical limitations.  He describes recent onset symptoms of exertional chest tightness with shortness of breath consistent with classical angina pectoris. He otherwise denies problems with exertional shortness of breath. He reports no history of resting shortness of breath, PND, orthopnea, or lower extremity edema. He reports occasional mild dizzy spells without syncope. He has not had palpitations. His energy level is normal.  Past Medical History  Diagnosis Date  . Coronary artery disease   . Hyperlipidemia, mixed   . Hypertension   . Hypothyroidism   . Kidney stones     "passed on my own" (12-28-14)  . Anginal pain   . Type II diabetes mellitus     Past Surgical History  Procedure Laterality Date  . Wisdom tooth extraction  1980's    'all 4"  . Coronary angioplasty with stent placement  12/2003    CFX  . Coronary angioplasty  01/2004    failed LAD PCI  . Cardiac catheterization  2014-12-28    "recommending about [facility]"    Family History  Problem Relation Age of Onset  . Coronary artery disease Father 47    History   Social History  . Marital Status: Divorced    Spouse Name: N/A    Number of Children: 2  . Years of Education: N/A   Occupational History  . [employer] Other  .  Social History Main Topics  . Smoking status: Former Smoker -- 0.50 packs/day for 30 years    Types: Cigarettes  . Smokeless tobacco: Never Used     Comment: "quit smoking cigarettes in ~ 2007"  . Alcohol Use: No  . Drug Use: No  . Sexual Activity: Yes   Other Topics Concern  . Not on file   Social History Narrative    Prior to Admission medications     Medication Sig Start Date End Date Taking? Authorizing Provider  amLODipine (NORVASC) 10 MG tablet TAKE ONE TABLET BY MOUTH EVERY DAY 12/31/13  Yes Burnell Blanks, MD  aspirin 81 MG tablet Take 1 tablet (81 mg total) by mouth daily. 11/13/12  Yes Burnell Blanks, MD  carvedilol (COREG) 12.5 MG tablet TAKE ONE TABLET BY MOUTH TWICE DAILY WITH A MEAL 12/31/13  Yes Burnell Blanks, MD  fluticasone [facility]) 50 MCG/ACT nasal spray Place 1 spray into both nostrils daily as needed for allergies or rhinitis.   Yes Historical Provider, MD  isosorbide mononitrate (IMDUR) 30 MG 24 hr tablet Take 1 tablet (30 mg total) by mouth daily. 12/02/14  Yes Luke K Kilroy, PA-C  loratadine (CLARITIN) 10 MG tablet Take 10 mg by mouth daily.   Yes Historical Provider, MD  metFORMIN (GLUCOPHAGE) 1000 MG tablet Take 1,000-1,500 mg by mouth 2 (two) times daily with a meal. *TAKES 1000MG  IN THE MORNING AND 1500MG  IN THE EVENING*   Yes Historical Provider, MD  multivitamin [facility]) per tablet Take 1 tablet by mouth daily.     Yes Historical Provider, MD  nitroGLYCERIN (NITROSTAT) 0.4 MG SL tablet Place 1 tablet (0.4 mg total) under the tongue every 5 (five) minutes as needed. 12/02/14  Yes Luke K Kilroy, PA-C  quinapril (ACCUPRIL) 40 MG tablet TAKE TWO TABLETS BY MOUTH EVERY DAY Patient taking differently: Take 80 mg by mouth daily.  12/31/13  Yes Burnell Blanks, MD  simvastatin (ZOCOR) 40 MG tablet TAKE ONE TABLET BY MOUTH AT BEDTIME 12/31/13  Yes Burnell Blanks, MD    Current Facility-Administered Medications  Medication Dose Route Frequency Provider Last Rate Last Dose  . 0.9 %  sodium chloride infusion   Intravenous Continuous Burnell Blanks, MD      . acetaminophen (TYLENOL) tablet 650 mg  650 mg Oral Q4H PRN Burnell Blanks, MD      . Derrill Memo ON 12/08/2014] amLODipine (NORVASC) tablet 10 mg  10 mg Oral Daily Burnell Blanks, MD      . Derrill Memo ON 12/08/2014]  aspirin chewable tablet 81 mg  81 mg Oral Daily Burnell Blanks, MD      . atorvastatin (LIPITOR) tablet 80 mg  80 mg Oral q1800 Burnell Blanks, MD      . carvedilol (COREG) tablet 12.5 mg  12.5 mg Oral BID WC Burnell Blanks, MD      . fluticasone (FLONASE) 50 MCG/ACT nasal spray 1 spray  1 spray Each Nare Daily PRN Burnell Blanks, MD      . isosorbide mononitrate (IMDUR) 24 hr tablet 30 mg  30 mg Oral Daily Burnell Blanks, MD      . lisinopril (PRINIVIL,ZESTRIL) tablet 40 mg  40 mg Oral Daily Burnell Blanks, MD      . Derrill Memo ON 12/08/2014] loratadine (CLARITIN) tablet 10 mg  10 mg Oral Daily Burnell Blanks, MD      . nitroGLYCERIN (NITROSTAT) SL tablet 0.4 mg  0.4 mg  Sublingual Q5 min PRN Burnell Blanks, MD      . ondansetron [facility]) injection 4 mg  4 mg Intravenous Q6H PRN Burnell Blanks, MD      . Derrill Memo ON 12/08/2014] pneumococcal 23 valent vaccine (PNU-IMMUNE) injection 0.5 mL  0.5 mL Intramuscular Tomorrow-1000 Burnell Blanks, MD        No Known Allergies    Review of Systems:   General:  normal appetite, normal energy, no weight gain, no weight loss, no fever  Cardiac:  + chest pain with exertion, no chest pain at rest, + SOB with exertion, no resting SOB, no PND, no orthopnea, no palpitations, no arrhythmia, no atrial fibrillation, no LE edema, no dizzy spells, no syncope  Respiratory:  no shortness of breath, no home oxygen, no productive cough, no dry cough, no bronchitis, no wheezing, no hemoptysis, no asthma, no pain with inspiration or cough, no sleep apnea, no CPAP at night  GI:   no difficulty swallowing, no reflux, no frequent heartburn, no hiatal hernia, no abdominal pain, no constipation, no diarrhea, no hematochezia, no hematemesis, no melena  GU:   no dysuria,  no frequency, no urinary tract infection, no hematuria, no enlarged prostate, no kidney stones, no kidney disease  Vascular:  no pain  suggestive of claudication, no pain in feet, no leg cramps, no varicose veins, no DVT, no non-healing foot ulcer  Neuro:   no stroke, no TIA's, no seizures, no headaches, no temporary blindness one eye,  no slurred speech, no peripheral neuropathy, no chronic pain, no instability of gait, no memory/cognitive dysfunction  Musculoskeletal: no arthritis, no joint swelling, no myalgias, no difficulty walking, normal mobility   Skin:   no rash, no itching, no skin infections, no pressure sores or ulcerations  Psych:   no anxiety, no depression, no nervousness, no unusual recent stress  Eyes:   no blurry vision, no floaters, no recent vision changes, + wears glasses for reading  ENT:   no hearing loss, no loose or painful teeth, no dentures, last saw dentist within 2 years  Hematologic:  no easy bruising, no abnormal bleeding, no clotting disorder, no frequent epistaxis  Endocrine:  + diabetes, occasionally checks CBG's at home     Physical Exam:   BP 144/88 mmHg  Pulse 83  Temp(Src) 97.8 F (36.6 C) (Oral)  Resp 18  Ht 5\' 11"  (1.803 m)  Wt 97.523 kg (215 lb)  BMI 30.00 kg/m2  SpO2 98%  General:  Mild to moderately obese,  well-appearing  HEENT:  Unremarkable   Neck:   no JVD, no bruits, no adenopathy   Chest:   clear to auscultation, symmetrical breath sounds, no wheezes, no rhonchi   CV:   RRR, no murmur   Abdomen:  soft, non-tender, no masses   Extremities:  warm, well-perfused, pulses palpable, no lower extremity edema  Rectal/GU  Deferred  Neuro:   Grossly non-focal and symmetrical throughout  Skin:   Clean and dry, no rashes, no breakdown  Diagnostic Tests:  Cardiac Catheterization Operative Report  PREET PERRIER [id number] 12/16/20152:48 PM [name], MD  Procedure Performed:  1. Left Heart Catheterization 2. Selective Coronary Angiography 3. Left ventricular angiogram  Operator: Lauree Chandler, MD  Arterial access site: Right radial artery.    Indication: 63 yo male with history of DM, HTN, HLD and CAD with posterior MI in 2005 with acutely occluded Circumflex treated with a Taxus DES. His LAD was occluded at that time and could  not be opened by Dr. Olevia Perches. He has done well over the last 10 years but has over the last 2 weeks noted chest pain with minimal exertion c/w unstable angina.   Procedure Details: The risks, benefits, complications, treatment options, and expected outcomes were discussed with the patient. The patient and/or family concurred with the proposed plan, giving informed consent. The patient was brought to the cath lab after IV hydration was begun and oral premedication was given. The patient was further sedated with Versed and Fentanyl. The right wrist was assessed with a modified Allens test which was positive. The right wrist was prepped and draped in a sterile fashion. 1% lidocaine was used for local anesthesia. Using the modified Seldinger access technique, a 5 French sheath was placed in the right radial artery. 3 mg Verapamil was given through the sheath. 5000 units IV heparin was given. Standard diagnostic catheters were used to perform selective coronary angiography. A pigtail catheter was used to perform a left ventricular angiogram. The sheath was removed from the right radial artery and a Terumo hemostasis band was applied at the arteriotomy site on the right wrist.   There were no immediate complications. The patient was taken to the recovery area in stable condition.   Hemodynamic Findings: Central aortic pressure: 140/78 Left ventricular pressure: 144/10/21  Angiographic Findings:  Left main: 40-50% ostial stenosis.   Left Anterior Descending Artery: Large caliber vessel that courses to the apex. Proximal vessel with diffuse 30-40% stenosis. The mid vessel is occluded after the takeoff of the diagonal branch. The mid and distal vessel fills from left to left  collaterals. The mid and distal vessel has diffuse 40% stenosis with several areas of 50% stenosis. The diagonal branch is a moderate to large caliber vessel with ostial 40% stenosis, mid 90% stenosis.   Circumflex Artery: Large caliber vessel with proximal 60-70% stenosis prior to the stented segment. The stent extends from the proximal vessel into the first obtuse marginal branch. The stented segment has no restenosis. The large caliber obtuse marginal branch is patent.   Right Coronary Artery: Large dominant vessel with 99% proximal stenosis, diffuse 20% mid and distal stenosis.   Left Ventricular Angiogram: LVEF=35% with akinesis of the anteroapical wall and apex.   Impression: 1. Unstable angina 2. Severe triple vessel CAD with moderate left main stenosis, occluded mid LAD, moderate proximal Circumflex stenosis and severe proximal RCA stenosis.  3. Moderate segmental LV systolic dysfunction  Recommendations: He has progression of CAD since last cath in 2005. His likely culprit lesion for his recent angina is his RCA stenosis but given the severity of disease in the LAD, Diagonal and Circumflex branches, I will stop today and a get a surgical opinion regarding possible CABG in this patient with diabetes. Will admit tonight to telemetry.    Complications: None. The patient tolerated the procedure well.       Impression:  Patient has severe three-vessel coronary artery disease with moderate left ventricular systolic dysfunction and presents with recent onset symptoms of exertional angina which are accelerating in a crescendo pattern and consistent with unstable angina. I personally reviewed the patient's diagnostic cardiac catheterization and I completely agree that he would best be treated with surgical revascularization.    Plan:  I have reviewed the indications, risks, and potential benefits of coronary artery bypass grafting with the patient and his son.   Alternative treatment strategies have been discussed.  The patient understands and accepts all potential associated risks of surgery including  but not limited to risk of death, stroke or other neurologic complication, myocardial infarction, congestive heart failure, respiratory failure, renal failure, bleeding requiring blood transfusion and/or reexploration, aortic dissection or other major vascular complication, arrhythmia, heart block or bradycardia requiring permanent pacemaker, pneumonia, pleural effusion, wound infection, pulmonary embolus or other thromboembolic complication, chronic pain or other delayed complications related to median sternotomy, or the late recurrence of symptomatic ischemic heart disease and/or congestive heart failure.  The importance of long term risk modification have been emphasized.  All questions answered.  We tentatively plan to proceed with surgery on Friday, 12/09/2014.     I spent in excess of 120 minutes during the conduct of this hospital consultation and >50% of this time involved direct face-to-face encounter for counseling and/or coordination of the patient's care.   Valentina Gu. Roxy Manns, MD 12/07/2014 4:46 PM

## 2014-12-07 NOTE — Progress Notes (Signed)
TR BAND REMOVAL  LOCATION:    right radial  DEFLATED PER PROTOCOL:    Yes.    TIME BAND OFF / DRESSING APPLIED:    1900   SITE UPON ARRIVAL:    Level 0  SITE AFTER BAND REMOVAL:    Level 0   CIRCULATION SENSATION AND MOVEMENT:    Within Normal Limits   Yes.    COMMENTS:   Rechecked site at [place] with no change in assessment. Site without bleeding, hematoma or swelling, dressing dry and intact, pulses +2 radial and ulnar.

## 2014-12-07 NOTE — CV Procedure (Signed)
Cardiac Catheterization Operative Report  Zachary Ellis [id number] 12/16/20152:48 PM Zachary Downing, MD  Procedure Performed:  1. Left Heart Catheterization 2. Selective Coronary Angiography 3. Left ventricular angiogram  Operator: Lauree Chandler, MD  Arterial access site:  Right radial artery.   Indication: 63 yo male with history of DM, HTN, HLD and CAD with posterior MI in 2005 with acutely occluded Circumflex treated with a Taxus DES. His LAD was occluded at that time and could not be opened by Dr. Olevia Ellis. He has done well over the last 10 years but has over the last 2 weeks noted chest pain with minimal exertion c/w unstable angina.                                       Procedure Details: The risks, benefits, complications, treatment options, and expected outcomes were discussed with the patient. The patient and/or family concurred with the proposed plan, giving informed consent. The patient was brought to the cath lab after IV hydration was begun and oral premedication was given. The patient was further sedated with Versed and Fentanyl. The right wrist was assessed with a modified Allens test which was positive. The right wrist was prepped and draped in a sterile fashion. 1% lidocaine was used for local anesthesia. Using the modified Seldinger access technique, a 5 French sheath was placed in the right radial artery. 3 mg Verapamil was given through the sheath. 5000 units IV heparin was given. Standard diagnostic catheters were used to perform selective coronary angiography. A pigtail catheter was used to perform a left ventricular angiogram. The sheath was removed from the right radial artery and a Terumo hemostasis band was applied at the arteriotomy site on the right wrist.    There were no immediate complications. The patient was taken to the recovery area in stable condition.   Hemodynamic Findings: Central aortic pressure: 140/78 Left ventricular pressure:  144/10/21  Angiographic Findings:  Left main: 40-50% ostial stenosis.   Left Anterior Descending Artery: Large caliber vessel that courses to the apex. Proximal vessel with diffuse 30-40% stenosis. The mid vessel is occluded after the takeoff of the diagonal branch. The mid and distal vessel fills from left to left collaterals. The mid and distal vessel has diffuse 40% stenosis with several areas of 50% stenosis. The diagonal branch is a moderate to large caliber vessel with ostial 40% stenosis, mid 90% stenosis.   Circumflex Artery: Large caliber vessel with proximal 60-70% stenosis prior to the stented segment. The stent extends from the proximal vessel into the first obtuse marginal branch. The stented segment has no restenosis. The large caliber obtuse marginal branch is patent.   Right Coronary Artery: Large dominant vessel with 99% proximal stenosis, diffuse 20% mid and distal stenosis.   Left Ventricular Angiogram: LVEF=35% with akinesis of the anteroapical wall and apex.   Impression: 1. Unstable angina 2. Severe triple vessel CAD with moderate left main stenosis, occluded mid LAD, moderate proximal Circumflex stenosis and severe proximal RCA stenosis.  3. Moderate segmental LV systolic dysfunction  Recommendations: He has progression of CAD since last cath in 2005. His likely culprit lesion for his recent angina is his RCA stenosis but given the severity of disease in the LAD, Diagonal and Circumflex branches, I will stop today and a get a surgical opinion regarding possible CABG in this patient with diabetes. Will admit tonight to  telemetry.        Complications:  None. The patient tolerated the procedure well.

## 2014-12-07 NOTE — Interval H&P Note (Signed)
History and Physical Interval Note:  12/07/2014 2:01 PM  Domingo Sep  has presented today for cardiac cath with the diagnosis of unstable angina. The various methods of treatment have been discussed with the patient and family. After consideration of risks, benefits and other options for treatment, the patient has consented to  Procedure(s): LEFT HEART CATHETERIZATION WITH CORONARY ANGIOGRAM (N/A) as a surgical intervention .  The patient's history has been reviewed, patient examined, no change in status, stable for surgery.  I have reviewed the patient's chart and labs.  Questions were answered to the patient's satisfaction.    Cath Lab Visit (complete for each Cath Lab visit)  Clinical Evaluation Leading to the Procedure:   ACS: No.  Non-ACS:    Anginal Classification: CCS III  Anti-ischemic medical therapy: Maximal Therapy (2 or more classes of medications)  Non-Invasive Test Results: No non-invasive testing performed  Prior CABG: No previous CABG         Jakya Dovidio

## 2014-12-07 NOTE — H&P (View-Only) (Signed)
12/02/2014 Zachary Ellis   02-06-1951  [id number]  Primary Physician [name], MD Primary Cardiologist: Dr Julianne Handler  HPI:  63 y/o male, works for a [employer], with a history of prior MI, CFX PCI in 2005. He also apparently had an attempt at an LAD PCI that was unsuccessful. He has done well from a cardiac standpoint. He had a negative treadmill in Feb 2015. He is in the office today as an add on for exertional chest tightness for the past two weeks. He describes exertional tightness and dyspnea. His symptoms are relieved with rest. He admits to radiation down both arms. He denies any rest symptoms.    Current Outpatient Prescriptions  Medication Sig Dispense Refill  . amLODipine (NORVASC) 10 MG tablet TAKE ONE TABLET BY MOUTH EVERY DAY 30 tablet 11  . aspirin 81 MG tablet Take 1 tablet (81 mg total) by mouth daily. 30 tablet 6  . carvedilol (COREG) 12.5 MG tablet TAKE ONE TABLET BY MOUTH TWICE DAILY WITH A MEAL 60 tablet 11  . metFORMIN (GLUCOPHAGE) 1000 MG tablet Take 1,000 mg by mouth 2 (two) times daily with a meal. Take 1 tablet in the AM and 1/2 tablet in the PM    . multivitamin (THERAGRAN) per tablet Take 1 tablet by mouth daily.      . quinapril (ACCUPRIL) 40 MG tablet TAKE TWO TABLETS BY MOUTH EVERY DAY (Patient taking differently: Take 40 mg by mouth 2 (two) times daily. TAKE TWO TABLETS BY MOUTH EVERY DAY) 60 tablet 11  . simvastatin (ZOCOR) 40 MG tablet TAKE ONE TABLET BY MOUTH AT BEDTIME 30 tablet 11  . isosorbide mononitrate (IMDUR) 30 MG 24 hr tablet Take 1 tablet (30 mg total) by mouth daily. 30 tablet 5  . nitroGLYCERIN (NITROSTAT) 0.4 MG SL tablet Place 1 tablet (0.4 mg total) under the tongue every 5 (five) minutes as needed. 25 tablet 3   No current facility-administered medications for this visit.  PMH- CAD, HTN, nephrolithiasis, Type 2 DM, dyslipidemia  No Known Allergies  History   Social History  . Marital Status: Married    Spouse  Name: N/A    Number of Children: 2  . Years of Education: N/A   Occupational History  . [employer] Other  .     Social History Main Topics  . Smoking status: Former Smoker -- 0.50 packs/day for 20 years    Types: Cigarettes    Quit date: 10/11/2007  . Smokeless tobacco: Not on file  . Alcohol Use: No  . Drug Use: No  . Sexual Activity: Not on file   Other Topics Concern  . Not on file   Social History Narrative   FMHx- his father had CAD and CHF  Review of Systems: General: negative for chills, fever, night sweats or weight changes.  Cardiovascular: negative for edema, orthopnea, palpitations, paroxysmal nocturnal dyspnea or shortness of breath Dermatological: negative for rash Respiratory: negative for cough or wheezing Urologic: negative for hematuria Abdominal: negative for nausea, vomiting, diarrhea, bright red blood per rectum, melena, or hematemesis Neurologic: negative for visual changes, syncope, or dizziness All other systems reviewed and are otherwise negative except as noted above.    Blood pressure 138/88, pulse 79, height 5\' 11"  (1.803 m), weight 225 lb 9.6 oz (102.331 kg).  General appearance: alert, cooperative and no distress Neck: no carotid bruit and no JVD Lungs: clear to auscultation bilaterally Heart: regular rate and rhythm Abdomen: soft, non-tender; bowel sounds  normal; no masses,  no organomegaly Extremities: extremities normal, atraumatic, no cyanosis or edema Pulses: 2+ and symmetric Skin: Skin color, texture, turgor normal. No rashes or lesions Neurologic: Grossly normal  EKG NSR with NSST changes, inf, post Qs- similar to prior EKG  ASSESSMENT AND PLAN:   Exertional angina Pt is experiencing exertional angina for the past two weeks.   Essential hypertension Controlled  Type 2 diabetes  On oral agents  HYPERLIPIDEMIA, MIXED On statin   PLAN  Reviewed with Dr Radford Pax in the office. He is totally asymptomatic at  present. We will Rx prn SL NTG, add Imdur 30 mg and schedule cath next week with Dr Julianne Handler. He should stay out of work till this get evaluated.   [facility]PA-C 12/02/2014 4:47 PM

## 2014-12-08 ENCOUNTER — Ambulatory Visit (HOSPITAL_COMMUNITY): Payer: BC Managed Care – PPO

## 2014-12-08 DIAGNOSIS — Z01810 Encounter for preprocedural cardiovascular examination: Secondary | ICD-10-CM

## 2014-12-08 DIAGNOSIS — I517 Cardiomegaly: Secondary | ICD-10-CM

## 2014-12-08 DIAGNOSIS — I200 Unstable angina: Secondary | ICD-10-CM

## 2014-12-08 LAB — TYPE AND SCREEN
ABO/RH(D): O POS
ANTIBODY SCREEN: NEGATIVE

## 2014-12-08 LAB — GLUCOSE, CAPILLARY
GLUCOSE-CAPILLARY: 143 mg/dL — AB (ref 70–99)
GLUCOSE-CAPILLARY: 124 mg/dL — AB (ref 70–99)
Glucose-Capillary: 128 mg/dL — ABNORMAL HIGH (ref 70–99)

## 2014-12-08 LAB — PULMONARY FUNCTION TEST
DL/VA % pred: 125 %
DL/VA: 5.65 ml/min/mmHg/L
DLCO UNC: 28.94 ml/min/mmHg
DLCO cor % pred: 98 %
DLCO cor: 29.1 ml/min/mmHg
DLCO unc % pred: 97 %
FEF 25-75 Post: 2.38 L/sec
FEF 25-75 Pre: 2.15 L/sec
FEF2575-%CHANGE-POST: 10 %
FEF2575-%Pred-Post: 86 %
FEF2575-%Pred-Pre: 78 %
FEV1-%Change-Post: 1 %
FEV1-%PRED-POST: 92 %
FEV1-%Pred-Pre: 91 %
FEV1-POST: 3.09 L
FEV1-Pre: 3.03 L
FEV1FVC-%CHANGE-POST: 0 %
FEV1FVC-%Pred-Pre: 98 %
FEV6-%Change-Post: 1 %
FEV6-%Pred-Post: 98 %
FEV6-%Pred-Pre: 96 %
FEV6-PRE: 4.06 L
FEV6-Post: 4.14 L
FEV6FVC-%Change-Post: 0 %
FEV6FVC-%PRED-POST: 104 %
FEV6FVC-%Pred-Pre: 104 %
FVC-%Change-Post: 1 %
FVC-%PRED-POST: 94 %
FVC-%Pred-Pre: 93 %
FVC-PRE: 4.12 L
FVC-Post: 4.18 L
POST FEV6/FVC RATIO: 99 %
PRE FEV6/FVC RATIO: 99 %
Post FEV1/FVC ratio: 74 %
Pre FEV1/FVC ratio: 74 %
RV % PRED: 119 %
RV: 2.56 L
TLC % PRED: 98 %
TLC: 6.52 L

## 2014-12-08 LAB — CBC
HCT: 42.1 % (ref 39.0–52.0)
Hemoglobin: 14.4 g/dL (ref 13.0–17.0)
MCH: 29.1 pg (ref 26.0–34.0)
MCHC: 34.2 g/dL (ref 30.0–36.0)
MCV: 85.2 fL (ref 78.0–100.0)
PLATELETS: 185 10*3/uL (ref 150–400)
RBC: 4.94 MIL/uL (ref 4.22–5.81)
RDW: 13.3 % (ref 11.5–15.5)
WBC: 7.4 10*3/uL (ref 4.0–10.5)

## 2014-12-08 LAB — BLOOD GAS, ARTERIAL
ACID-BASE DEFICIT: 1.8 mmol/L (ref 0.0–2.0)
Bicarbonate: 22.3 mEq/L (ref 20.0–24.0)
DRAWN BY: 41977
FIO2: 0.21 %
O2 SAT: 93.5 %
Patient temperature: 98.6
TCO2: 23.4 mmol/L (ref 0–100)
pCO2 arterial: 36.6 mmHg (ref 35.0–45.0)
pH, Arterial: 7.402 (ref 7.350–7.450)
pO2, Arterial: 71.1 mmHg — ABNORMAL LOW (ref 80.0–100.0)

## 2014-12-08 LAB — COMPREHENSIVE METABOLIC PANEL
ALBUMIN: 4.1 g/dL (ref 3.5–5.2)
ALT: 47 U/L (ref 0–53)
AST: 28 U/L (ref 0–37)
Alkaline Phosphatase: 75 U/L (ref 39–117)
Anion gap: 13 (ref 5–15)
BUN: 14 mg/dL (ref 6–23)
CO2: 24 meq/L (ref 19–32)
CREATININE: 0.88 mg/dL (ref 0.50–1.35)
Calcium: 9.7 mg/dL (ref 8.4–10.5)
Chloride: 102 mEq/L (ref 96–112)
GFR calc Af Amer: >90 mL/min (ref >90)
Glucose, Bld: 139 mg/dL — ABNORMAL HIGH (ref 70–99)
Potassium: 4.5 mEq/L (ref 3.7–5.3)
Sodium: 139 mEq/L (ref 137–147)
Total Bilirubin: 0.7 mg/dL (ref 0.3–1.2)
Total Protein: 7.4 g/dL (ref 6.0–8.3)

## 2014-12-08 LAB — LIPID PANEL
Cholesterol: 156 mg/dL (ref 0–200)
HDL: 31 mg/dL — AB (ref >39)
LDL Cholesterol: 95 mg/dL (ref 0–99)
Total CHOL/HDL Ratio: 5 RATIO
Triglycerides: 151 mg/dL — ABNORMAL HIGH (ref <150)
VLDL: 30 mg/dL (ref 0–40)

## 2014-12-08 LAB — APTT: aPTT: 33 seconds (ref 24–37)

## 2014-12-08 LAB — HEMOGLOBIN A1C
Hgb A1c MFr Bld: 7.1 % — ABNORMAL HIGH (ref <5.7)
Mean Plasma Glucose: 157 mg/dL — ABNORMAL HIGH (ref <117)

## 2014-12-08 LAB — PRO B NATRIURETIC PEPTIDE: PRO B NATRI PEPTIDE: 167.1 pg/mL — AB (ref 0–125)

## 2014-12-08 LAB — PROTIME-INR
INR: 1.04 (ref 0.00–1.49)
PROTHROMBIN TIME: 13.7 s (ref 11.6–15.2)

## 2014-12-08 LAB — ABO/RH: ABO/RH(D): O POS

## 2014-12-08 LAB — TSH: TSH: 0.459 u[IU]/mL (ref 0.350–4.500)

## 2014-12-08 MED ORDER — CHLORHEXIDINE GLUCONATE 4 % EX LIQD
60.0000 mL | Freq: Once | CUTANEOUS | Status: AC
  Administered 2014-12-09: 4 via TOPICAL
  Filled 2014-12-08: qty 60

## 2014-12-08 MED ORDER — MAGNESIUM SULFATE 50 % IJ SOLN
40.0000 meq | INTRAMUSCULAR | Status: DC
  Filled 2014-12-08: qty 10

## 2014-12-08 MED ORDER — ALBUTEROL SULFATE (2.5 MG/3ML) 0.083% IN NEBU
2.5000 mg | INHALATION_SOLUTION | Freq: Once | RESPIRATORY_TRACT | Status: AC
  Administered 2014-12-08: 09:00:00 2.5 mg via RESPIRATORY_TRACT

## 2014-12-08 MED ORDER — PHENYLEPHRINE HCL 10 MG/ML IJ SOLN
30.0000 ug/min | INTRAVENOUS | Status: AC
  Administered 2014-12-09: 10 ug/min via INTRAVENOUS
  Filled 2014-12-08: qty 2

## 2014-12-08 MED ORDER — VANCOMYCIN HCL 10 G IV SOLR
1250.0000 mg | INTRAVENOUS | Status: AC
  Administered 2014-12-09: 1250 mg via INTRAVENOUS
  Filled 2014-12-08: qty 1250

## 2014-12-08 MED ORDER — CHLORHEXIDINE GLUCONATE 4 % EX LIQD
60.0000 mL | Freq: Once | CUTANEOUS | Status: AC
  Administered 2014-12-08: 4 via TOPICAL
  Filled 2014-12-08: qty 60

## 2014-12-08 MED ORDER — BISACODYL 5 MG PO TBEC
5.0000 mg | DELAYED_RELEASE_TABLET | Freq: Once | ORAL | Status: AC
  Administered 2014-12-08: 5 mg via ORAL
  Filled 2014-12-08: qty 1

## 2014-12-08 MED ORDER — SODIUM CHLORIDE 0.9 % IV SOLN
INTRAVENOUS | Status: AC
  Administered 2014-12-09: 69.8 mL/h via INTRAVENOUS
  Filled 2014-12-08: qty 40

## 2014-12-08 MED ORDER — DEXTROSE 5 % IV SOLN
1.5000 g | INTRAVENOUS | Status: AC
  Administered 2014-12-09: .75 g via INTRAVENOUS
  Administered 2014-12-09: 1.5 g via INTRAVENOUS
  Filled 2014-12-08: qty 1.5

## 2014-12-08 MED ORDER — DEXMEDETOMIDINE HCL IN NACL 400 MCG/100ML IV SOLN
0.1000 ug/kg/h | INTRAVENOUS | Status: AC
  Administered 2014-12-09: 0.2 ug/kg/h via INTRAVENOUS
  Filled 2014-12-08: qty 100

## 2014-12-08 MED ORDER — PLASMA-LYTE 148 IV SOLN
INTRAVENOUS | Status: AC
  Administered 2014-12-09: 500 mL
  Filled 2014-12-08: qty 2.5

## 2014-12-08 MED ORDER — DOPAMINE-DEXTROSE 3.2-5 MG/ML-% IV SOLN
0.0000 ug/kg/min | INTRAVENOUS | Status: DC
  Filled 2014-12-08: qty 250

## 2014-12-08 MED ORDER — EPINEPHRINE HCL 1 MG/ML IJ SOLN
0.0000 ug/min | INTRAVENOUS | Status: DC
  Filled 2014-12-08: qty 4

## 2014-12-08 MED ORDER — NITROGLYCERIN IN D5W 200-5 MCG/ML-% IV SOLN
2.0000 ug/min | INTRAVENOUS | Status: AC
  Administered 2014-12-09: 5 ug/min via INTRAVENOUS
  Filled 2014-12-08: qty 250

## 2014-12-08 MED ORDER — TEMAZEPAM 15 MG PO CAPS: 15.0000 mg | ORAL_CAPSULE | Freq: Once | ORAL | Status: AC | PRN

## 2014-12-08 MED ORDER — DEXTROSE 5 % IV SOLN
750.0000 mg | INTRAVENOUS | Status: DC
  Filled 2014-12-08: qty 750

## 2014-12-08 MED ORDER — POTASSIUM CHLORIDE 2 MEQ/ML IV SOLN
80.0000 meq | INTRAVENOUS | Status: DC
  Filled 2014-12-08: qty 40

## 2014-12-08 MED ORDER — SODIUM CHLORIDE 0.9 % IV SOLN
INTRAVENOUS | Status: DC
  Filled 2014-12-08: qty 30

## 2014-12-08 MED ORDER — METOPROLOL TARTRATE 12.5 MG HALF TABLET
12.5000 mg | ORAL_TABLET | Freq: Once | ORAL | Status: AC
  Administered 2014-12-09: 12.5 mg via ORAL
  Filled 2014-12-08: qty 1

## 2014-12-08 MED ORDER — VANCOMYCIN HCL 1000 MG IV SOLR
INTRAVENOUS | Status: AC
  Administered 2014-12-09: 09:00:00 1000 mL
  Filled 2014-12-08: qty 1000

## 2014-12-08 MED ORDER — SODIUM CHLORIDE 0.9 % IV SOLN
INTRAVENOUS | Status: AC
  Administered 2014-12-09: 1 [IU]/h via INTRAVENOUS
  Filled 2014-12-08 (×2): qty 2.5

## 2014-12-08 NOTE — Progress Notes (Signed)
Report to Jake Bathe RN on [place], patient transferring via wheelchair to  [place] with staff on telemetry.

## 2014-12-08 NOTE — Progress Notes (Signed)
[address]       [place] [address]             [phone]     CARDIOTHORACIC SURGERY PROGRESS NOTE  1 Day Post-Op  S/P Procedure(s) (LRB): LEFT HEART CATHETERIZATION WITH CORONARY ANGIOGRAM (N/A)  Subjective: No complaints.  No chest discomfort  Objective: Vital signs in last 24 hours: Temp:  [97.9 F (36.6 C)-98.4 F (36.9 C)] 97.9 F (36.6 C) (12/17 1553) Pulse Rate:  [78-98] 81 (12/17 1553) Cardiac Rhythm:  [-] Normal sinus rhythm (12/17 0720) Resp:  [18-20] 18 (12/17 1553) BP: (111-188)/(67-101) 159/87 mmHg (12/17 1553) SpO2:  [97 %-99 %] 97 % (12/17 1553) Weight:  [98 kg (216 lb 0.8 oz)] 98 kg (216 lb 0.8 oz) (12/17 0021)  Physical Exam:  Rhythm:   sinus  Breath sounds: clear  Heart sounds:  RRR  Incisions:  n/a  Abdomen:  soft  Extremities:  warm   Intake/Output from previous day: 12/16 0701 - 12/17 0700 In: 760 [P.O.:460; I.V.:300] Out: 300 [Urine:300] Intake/Output this shift: Total I/O In: -  Out: 150 [Urine:150]  Lab Results:  Recent Labs  12/08/14 0346  WBC 7.4  HGB 14.4  HCT 42.1  PLT 185   BMET:  Recent Labs  12/08/14 0346  NA 139  K 4.5  CL 102  CO2 24  GLUCOSE 139*  BUN 14  CREATININE 0.88  CALCIUM 9.7    CBG (last 3)   Recent Labs  12/07/14 2105 12/08/14 0651 12/08/14 1257  GLUCAP 106* 143* 124*   PT/INR:   Recent Labs  12/08/14 0346  LABPROT 13.7  INR 1.04    CXR:  CHEST 2 VIEW  COMPARISON: None.  FINDINGS: Mediastinum hilar structures are normal. Heart size and pulmonary vascularity normal. Mild subsegmental atelectasis at the right perihilar region and left lung base. No focal pulmonary infiltrate. No pleural effusion or pneumothorax. No acute osseus abnormality .  IMPRESSION: Mild right perihilar and left lung base subsegmental atelectasis, otherwise negative chest.   Electronically Signed  By: Marcello Moores Register  On: 12/08/2014 07:38    Transthoracic  Echocardiography  Patient:  Taris, Galindo MR #:    [id number] Study Date: 12/08/2014 Gender:   M Age:    63 Height:   180.3 cm Weight:   94.3 kg BSA:    2.19 m^2 Pt. Status: Room:    6C04C  ADMITTING  Darlina Guys, MD ATTENDING  Darlina Guys, MD Willia Craze, Christopher REFERRING  Lauree Chandler SONOGRAPHER Jimmy Reel, RDCS PERFORMING  [facility], Inpatient  cc:  ------------------------------------------------------------------- LV EF: 35% -  40%  ------------------------------------------------------------------- Indications:   Chest pain 786.51. Cardiomyopathy - dilated 425.4.  ------------------------------------------------------------------- History:  PMH: No prior cardiac history.  ------------------------------------------------------------------- Study Conclusions  - Left ventricle: The cavity size was normal. Wall thickness was increased in a pattern of mild LVH. Systolic function was moderately reduced. The estimated ejection fraction was in the range of 35% to 40%. There is inferolateral hypokinesis. Doppler parameters are consistent with abnormal left ventricular relaxation (grade 1 diastolic dysfunction). The E/e&' ratio is <8, suggesting normal LV filling pressure. - Aorta: The sinotubular junction is mildly dilated at 4.1 cm. The aortic root measures within normal limits. - Left atrium: The atrium was normal in size.  Impressions:  - LVEF 35-40%, inferior and lateral hypokinesis, diastolic dysfunction, normal LV filling pressure, mildly dilated STJ at 4.1 cm.  Transthoracic echocardiography. M-mode, complete 2D, spectral Doppler, and  color Doppler. Birthdate: Patient birthdate: 04/14/1951. Age: Patient is 63 yr old. Sex: Gender: male. BMI: 29 kg/m^2. Blood pressure:   156/88 Patient status: Inpatient. Study date: Study date: 12/08/2014. Study time:  11:19 AM. Location: ICU/CCU  -------------------------------------------------------------------  ------------------------------------------------------------------- Left ventricle: The cavity size was normal. Wall thickness was increased in a pattern of mild LVH. Systolic function was moderately reduced. The estimated ejection fraction was in the range of 35% to 40%. There is inferolateral hypokinesis. Doppler parameters are consistent with abnormal left ventricular relaxation (grade 1 diastolic dysfunction). The E/e&' ratio is <8, suggesting normal LV filling pressure.  ------------------------------------------------------------------- Aortic valve:  Structurally normal valve. Trileaflet. Cusp separation was normal. Doppler: Transvalvular velocity was within the normal range. There was no stenosis. There was no regurgitation.  ------------------------------------------------------------------- Aorta: The sinotubular junction is mildly dilated at 4.1 cm. The aortic root measures within normal limits.  ------------------------------------------------------------------- Mitral valve:  Structurally normal valve.  Leaflet separation was normal. Doppler: Transvalvular velocity was within the normal range. There was no evidence for stenosis. There was trivial regurgitation.  ------------------------------------------------------------------- Left atrium: The atrium was normal in size.  ------------------------------------------------------------------- Atrial septum: No defect or patent foramen ovale was identified.  ------------------------------------------------------------------- Right ventricle: The cavity size was normal. Wall thickness was normal. Systolic function was normal.  ------------------------------------------------------------------- Pulmonic valve:  The valve appears to be grossly normal. Doppler: There was no significant  regurgitation.  ------------------------------------------------------------------- Tricuspid valve:  Doppler: There was no significant regurgitation.  ------------------------------------------------------------------- Pulmonary artery:  The main pulmonary artery was normal-sized.  ------------------------------------------------------------------- Right atrium: The atrium was normal in size.  ------------------------------------------------------------------- Pericardium: There was no pericardial effusion.  ------------------------------------------------------------------- Systemic veins: Inferior vena cava: The vessel was normal in size. The respirophasic diameter changes were in the normal range (= 50%), consistent with normal central venous pressure.  ------------------------------------------------------------------- Post procedure conclusions Ascending Aorta:  - The sinotubular junction is mildly dilated at 4.1 cm. The aortic root measures within normal limits.  ------------------------------------------------------------------- Measurements  Left ventricle              Value    Reference LV ID, ED, PLAX chordal         45  mm   43 - 52 LV ID, ES, PLAX chordal     (H)   40.5 mm   23 - 38 LV fx shortening, PLAX chordal  (L)   10  %   >=29 LV PW thickness, ED           10.7 mm   --------- IVS/LV PW ratio, ED           1.06     <=1.3 LV ejection fraction, 1-p A4C      29  %   --------- LV end-diastolic volume, 2-p       118  ml   --------- LV end-systolic volume, 2-p       80  ml   --------- LV ejection fraction, 2-p        32  %   --------- Stroke volume, 2-p            38  ml   --------- LV end-diastolic volume/bsa, 2-p     54  ml/m^2 --------- LV end-systolic volume/bsa, 2-p     36  ml/m^2  --------- Stroke volume/bsa, 2-p          17.3 ml/m^2 --------- LV e&', lateral              5.8  cm/s  --------- LV  E/e&', lateral             6.55     --------- LV e&', medial              4.2  cm/s  --------- LV E/e&', medial             9.05     --------- LV e&', average              5   cm/s  --------- LV E/e&', average             7.6     ---------  Ventricular septum            Value    Reference IVS thickness, ED            11.3 mm   ---------  Aorta                  Value    Reference Aortic root ID, ED            41.12 mm   ---------  Left atrium               Value    Reference LA ID, A-P, ES              33  mm   --------- LA ID/bsa, A-P              1.5  cm/m^2 <=2.2 LA volume, S               56  ml   --------- LA volume/bsa, S             25.5 ml/m^2 --------- LA volume, ES, 1-p A4C          61  ml   --------- LA volume/bsa, ES, 1-p A4C        27.8 ml/m^2 --------- LA volume, ES, 1-p A2C          42  ml   --------- LA volume/bsa, ES, 1-p A2C        19.1 ml/m^2 ---------  Mitral valve               Value    Reference Mitral E-wave peak velocity       38  cm/s  --------- Mitral A-wave peak velocity       91.3 cm/s  --------- Mitral deceleration time         194  ms   150 - 230 Mitral E/A ratio, peak          0.4     ---------  Systemic veins              Value    Reference Estimated CVP              3   mm Hg ---------  Right ventricle             Value    Reference RV s&', lateral, S            9.2  cm/s   ---------  Legend: (L) and (H) mark values outside specified reference range.  ------------------------------------------------------------------- Prepared and Electronically Authenticated by  Lyman Bishop MD 2015-12-17T13:31:30   Assessment/Plan: S/P Procedure(s) (LRB): LEFT HEART CATHETERIZATION WITH CORONARY ANGIOGRAM (N/A)  For CABG in am tomorrow.  All questions answered.  Louann Hopson H 12/08/2014 4:54 PM

## 2014-12-08 NOTE — Progress Notes (Signed)
Patient Name: Zachary Ellis Date of Encounter: 12/08/2014  Primary Cardiologist: Dr Julianne Handler   Principal Problem:   Unstable angina Active Problems:   Hypothyroidism   HYPERLIPIDEMIA, MIXED   Essential hypertension   CAD S/P CFX stent '05, failed LAD PCI '05   Type 2 diabetes     SUBJECTIVE  Denies any CP or SOB overnight. Pending CABG tomorrow  CURRENT MEDS . amLODipine  10 mg Oral Daily  . aspirin  81 mg Oral Daily  . atorvastatin  80 mg Oral q1800  . carvedilol  12.5 mg Oral BID WC  . isosorbide mononitrate  30 mg Oral Daily  . lisinopril  40 mg Oral Daily  . loratadine  10 mg Oral Daily  . pneumococcal 23 valent vaccine  0.5 mL Intramuscular Tomorrow-1000    OBJECTIVE  Filed Vitals:   12/07/14 2057 12/07/14 2100 12/08/14 0021 12/08/14 0349  BP: 168/97 168/97 111/67 156/88  Pulse: 98  78 78  Temp: 98.4 F (36.9 C)  98 F (36.7 C) 98 F (36.7 C)  TempSrc: Oral  Oral Oral  Resp: 18  18 20   Height:      Weight:   216 lb 0.8 oz (98 kg)   SpO2: 99%  97% 97%    Intake/Output Summary (Last 24 hours) at 12/08/14 0824 Last data filed at 12/07/14 1900  Gross per 24 hour  Intake    520 ml  Output    300 ml  Net    220 ml   Filed Weights   12/07/14 1108 12/08/14 0021  Weight: 215 lb (97.523 kg) 216 lb 0.8 oz (98 kg)    PHYSICAL EXAM  General: Pleasant, NAD. Neuro: Alert and oriented X 3. Moves all extremities spontaneously. Psych: Normal affect. HEENT:  Normal  Neck: Supple without bruits or JVD. Lungs:  Resp regular and unlabored, CTA. Heart: RRR no s3, s4, or murmurs. R radial cath site stable Abdomen: Soft, non-tender, non-distended, BS + x 4.  Extremities: No clubbing, cyanosis or edema. DP/PT/Radials 2+ and equal bilaterally.  Accessory Clinical Findings  CBC  Recent Labs  12/08/14 0346  WBC 7.4  HGB 14.4  HCT 42.1  MCV 85.2  PLT 993   Basic Metabolic Panel  Recent Labs  12/08/14 0346  NA 139  K 4.5  CL 102  CO2 24    GLUCOSE 139*  BUN 14  CREATININE 0.88  CALCIUM 9.7   Liver Function Tests  Recent Labs  12/08/14 0346  AST 28  ALT 47  ALKPHOS 75  BILITOT 0.7  PROT 7.4  ALBUMIN 4.1   Fasting Lipid Panel  Recent Labs  12/08/14 0346  CHOL 156  HDL 31*  LDLCALC 95  TRIG 151*  CHOLHDL 5.0   Thyroid Function Tests  Recent Labs  12/08/14 0346  TSH 0.459    TELE NSR with HR 70-90s    ECG  NSR with incomplete RBBB   Radiology/Studies  Dg Chest 2 View  12/08/2014   CLINICAL DATA:  Chest pain.  EXAM: CHEST  2 VIEW  COMPARISON:  None.  FINDINGS: Mediastinum hilar structures are normal. Heart size and pulmonary vascularity normal. Mild subsegmental atelectasis at the right perihilar region and left lung base. No focal pulmonary infiltrate. No pleural effusion or pneumothorax. No acute osseus abnormality .  IMPRESSION: Mild right perihilar and left lung base subsegmental atelectasis, otherwise negative chest.   Electronically Signed   By: Pierson   On: 12/08/2014 07:38  ASSESSMENT AND PLAN  1. Class III angina  - exertional symptom for 2 wks  - severe triple vessel CAD EF 35%, 40-50% L main stenosis,  99% prox RCA stenosis, 90% mid diagonal stenosis, prox 60-70% LCx stenosis  - seen by CT surgery, plan for CABG tomorrow on Fri 12/09/2014  2. CAD  - CFX PCI in 2005, megative treadmill [facility] Feb 2015 3. HTN 4. DM 5. Hyperlipidemia  Signed, Almyra Deforest PA-C Pager: [phone]  Patient seen, examined. Available data reviewed. Agree with findings, assessment, and plan as outlined by Almyra Deforest, PA-C. Exam reveals an alert, oriented male in no distress. Lungs are clear. Heart is regular rate and rhythm. There is no peripheral edema. Cath report and surgical consultation reviewed. The patient is pending cardiac surgery tomorrow. All questions were answered. He has a bad headache with long-acting nitrates. Will discontinue his isosorbide as he has no chest discomfort at  rest.  Sherren Mocha, M.D. 12/08/2014 11:03 AM

## 2014-12-08 NOTE — Progress Notes (Addendum)
Pre-op Cardiac Surgery  Carotid Findings:  Bilateral:  1-39% ICA stenosis.  Vertebral artery flow is antegrade.    Landry Mellow, RDMS, RVT 12/08/2014     Upper Extremity Right Left  Brachial Pressures 143 Triphasic 141 Triphasic  Radial Waveforms Triphasic Triphasic  Ulnar Waveforms Biphasic Triphasic  Palmar Arch (Allen's Test) Abnormal Normal   Findings:  Doppler waveforms obliterated with radial compression and remained normal with ulnar compression on the right. Left Doppler waveforms remained normal with both radial and ulnar compressions.    Lower  Extremity Right Left  Dorsalis Pedis 147 Biphasic 146 Triphasic  Posterior Tibial 169 Triphasic 167 Triphasic  Ankle/Brachial Indices 1.18 1.17    Findings:  ABIs and Doppler waveforms are within normal limits bilaterally at rest.  Toma Copier, RVS 02/08/2014 6:43 PM

## 2014-12-08 NOTE — Progress Notes (Signed)
[id number] Just back from resp test. Education completed with pt who voiced understanding. Stated can get IS almost to top. Reviewed sternal precautions,IS use, and demonstrated getting up and down without using arms to strain. Discussed importance of mobility and IS after surgery in recovery. Has [facility] booklet and has watched preop video. Gave care guide for pt and family to read. We will follow up after surgery. Graylon Good RN BSN 12/08/2014 10:18 AM

## 2014-12-08 NOTE — Progress Notes (Signed)
Echocardiogram 2D Echocardiogram has been performed.  Zachary Ellis 12/08/2014, 11:19 AM

## 2014-12-09 ENCOUNTER — Encounter (HOSPITAL_COMMUNITY): Payer: Self-pay | Admitting: Anesthesiology

## 2014-12-09 ENCOUNTER — Ambulatory Visit (HOSPITAL_COMMUNITY): Payer: BC Managed Care – PPO | Admitting: Certified Registered"

## 2014-12-09 ENCOUNTER — Inpatient Hospital Stay (HOSPITAL_COMMUNITY): Payer: BC Managed Care – PPO

## 2014-12-09 ENCOUNTER — Encounter (HOSPITAL_COMMUNITY)
Admission: RE | Disposition: A | Discharge status: Home / Self Care | Payer: BC Managed Care – PPO | Source: Ambulatory Visit | Attending: Thoracic Surgery (Cardiothoracic Vascular Surgery)

## 2014-12-09 DIAGNOSIS — Z951 Presence of aortocoronary bypass graft: Secondary | ICD-10-CM

## 2014-12-09 DIAGNOSIS — J9811 Atelectasis: Secondary | ICD-10-CM | POA: Diagnosis not present

## 2014-12-09 DIAGNOSIS — I255 Ischemic cardiomyopathy: Secondary | ICD-10-CM | POA: Diagnosis present

## 2014-12-09 DIAGNOSIS — Z8249 Family history of ischemic heart disease and other diseases of the circulatory system: Secondary | ICD-10-CM | POA: Diagnosis not present

## 2014-12-09 DIAGNOSIS — Z79899 Other long term (current) drug therapy: Secondary | ICD-10-CM | POA: Diagnosis not present

## 2014-12-09 DIAGNOSIS — Z7982 Long term (current) use of aspirin: Secondary | ICD-10-CM | POA: Diagnosis not present

## 2014-12-09 DIAGNOSIS — Z955 Presence of coronary angioplasty implant and graft: Secondary | ICD-10-CM | POA: Diagnosis not present

## 2014-12-09 DIAGNOSIS — I25110 Atherosclerotic heart disease of native coronary artery with unstable angina pectoris: Secondary | ICD-10-CM | POA: Diagnosis present

## 2014-12-09 DIAGNOSIS — I5021 Acute systolic (congestive) heart failure: Secondary | ICD-10-CM | POA: Diagnosis not present

## 2014-12-09 DIAGNOSIS — E782 Mixed hyperlipidemia: Secondary | ICD-10-CM | POA: Diagnosis present

## 2014-12-09 DIAGNOSIS — R0789 Other chest pain: Secondary | ICD-10-CM | POA: Diagnosis present

## 2014-12-09 DIAGNOSIS — E039 Hypothyroidism, unspecified: Secondary | ICD-10-CM | POA: Diagnosis present

## 2014-12-09 DIAGNOSIS — Z87891 Personal history of nicotine dependence: Secondary | ICD-10-CM | POA: Diagnosis not present

## 2014-12-09 DIAGNOSIS — E119 Type 2 diabetes mellitus without complications: Secondary | ICD-10-CM | POA: Diagnosis present

## 2014-12-09 DIAGNOSIS — D62 Acute posthemorrhagic anemia: Secondary | ICD-10-CM | POA: Diagnosis not present

## 2014-12-09 DIAGNOSIS — I10 Essential (primary) hypertension: Secondary | ICD-10-CM | POA: Diagnosis present

## 2014-12-09 DIAGNOSIS — I252 Old myocardial infarction: Secondary | ICD-10-CM | POA: Diagnosis not present

## 2014-12-09 HISTORY — PX: CORONARY ARTERY BYPASS GRAFT: SHX141

## 2014-12-09 HISTORY — DX: Presence of aortocoronary bypass graft: Z95.1

## 2014-12-09 HISTORY — PX: INTRAOPERATIVE TRANSESOPHAGEAL ECHOCARDIOGRAM: SHX5062

## 2014-12-09 LAB — POCT I-STAT, CHEM 8
BUN: 16 mg/dL (ref 6–23)
BUN: 13 mg/dL (ref 6–23)
BUN: 13 mg/dL (ref 6–23)
BUN: 15 mg/dL (ref 6–23)
BUN: 13 mg/dL (ref 6–23)
BUN: 11 mg/dL (ref 6–23)
CALCIUM ION: 1.14 mmol/L (ref 1.13–1.30)
CHLORIDE: 100 meq/L (ref 96–112)
CHLORIDE: 103 meq/L (ref 96–112)
CREATININE: 0.7 mg/dL (ref 0.50–1.35)
CREATININE: 0.7 mg/dL (ref 0.50–1.35)
CREATININE: 0.8 mg/dL (ref 0.50–1.35)
Calcium, Ion: 1.28 mmol/L (ref 1.13–1.30)
Calcium, Ion: 0.98 mmol/L — ABNORMAL LOW (ref 1.13–1.30)
Calcium, Ion: 1.07 mmol/L — ABNORMAL LOW (ref 1.13–1.30)
Calcium, Ion: 1.08 mmol/L — ABNORMAL LOW (ref 1.13–1.30)
Calcium, Ion: 1.18 mmol/L (ref 1.13–1.30)
Chloride: 102 mEq/L (ref 96–112)
Chloride: 111 mEq/L (ref 96–112)
Chloride: 102 mEq/L (ref 96–112)
Chloride: 107 mEq/L (ref 96–112)
Creatinine, Ser: 0.7 mg/dL (ref 0.50–1.35)
Creatinine, Ser: 0.8 mg/dL (ref 0.50–1.35)
Creatinine, Ser: 0.8 mg/dL (ref 0.50–1.35)
GLUCOSE: 142 mg/dL — AB (ref 70–99)
GLUCOSE: 148 mg/dL — AB (ref 70–99)
GLUCOSE: 161 mg/dL — AB (ref 70–99)
Glucose, Bld: 172 mg/dL — ABNORMAL HIGH (ref 70–99)
Glucose, Bld: 132 mg/dL — ABNORMAL HIGH (ref 70–99)
Glucose, Bld: 137 mg/dL — ABNORMAL HIGH (ref 70–99)
HCT: 28 % — ABNORMAL LOW (ref 39.0–52.0)
HCT: 29 % — ABNORMAL LOW (ref 39.0–52.0)
HCT: 28 % — ABNORMAL LOW (ref 39.0–52.0)
HCT: 28 % — ABNORMAL LOW (ref 39.0–52.0)
HCT: 33 % — ABNORMAL LOW (ref 39.0–52.0)
HEMATOCRIT: 38 % — AB (ref 39.0–52.0)
HEMOGLOBIN: 9.9 g/dL — AB (ref 13.0–17.0)
Hemoglobin: 12.9 g/dL — ABNORMAL LOW (ref 13.0–17.0)
Hemoglobin: 9.5 g/dL — ABNORMAL LOW (ref 13.0–17.0)
Hemoglobin: 9.5 g/dL — ABNORMAL LOW (ref 13.0–17.0)
Hemoglobin: 9.5 g/dL — ABNORMAL LOW (ref 13.0–17.0)
Hemoglobin: 11.2 g/dL — ABNORMAL LOW (ref 13.0–17.0)
POTASSIUM: 4.2 meq/L (ref 3.7–5.3)
POTASSIUM: 4.4 meq/L (ref 3.7–5.3)
POTASSIUM: 5.4 meq/L — AB (ref 3.7–5.3)
POTASSIUM: 4.2 meq/L (ref 3.7–5.3)
Potassium: 5 mEq/L (ref 3.7–5.3)
Potassium: 3.9 mEq/L (ref 3.7–5.3)
SODIUM: 138 meq/L (ref 137–147)
SODIUM: 141 meq/L (ref 137–147)
Sodium: 139 mEq/L (ref 137–147)
Sodium: 133 mEq/L — ABNORMAL LOW (ref 137–147)
Sodium: 138 mEq/L (ref 137–147)
Sodium: 140 mEq/L (ref 137–147)
TCO2: 24 mmol/L (ref 0–100)
TCO2: 21 mmol/L (ref 0–100)
TCO2: 22 mmol/L (ref 0–100)
TCO2: 22 mmol/L (ref 0–100)
TCO2: 19 mmol/L (ref 0–100)
TCO2: 17 mmol/L (ref 0–100)

## 2014-12-09 LAB — POCT I-STAT 3, ART BLOOD GAS (G3+)
Acid-base deficit: 3 mmol/L — ABNORMAL HIGH (ref 0.0–2.0)
Acid-base deficit: 2 mmol/L (ref 0.0–2.0)
Acid-base deficit: 4 mmol/L — ABNORMAL HIGH (ref 0.0–2.0)
Acid-base deficit: 3 mmol/L — ABNORMAL HIGH (ref 0.0–2.0)
Acid-base deficit: 6 mmol/L — ABNORMAL HIGH (ref 0.0–2.0)
Acid-base deficit: 6 mmol/L — ABNORMAL HIGH (ref 0.0–2.0)
BICARBONATE: 23 meq/L (ref 20.0–24.0)
BICARBONATE: 23.2 meq/L (ref 20.0–24.0)
BICARBONATE: 23 meq/L (ref 20.0–24.0)
BICARBONATE: 19.7 meq/L — AB (ref 20.0–24.0)
Bicarbonate: 22.1 mEq/L (ref 20.0–24.0)
Bicarbonate: 19.6 mEq/L — ABNORMAL LOW (ref 20.0–24.0)
O2 SAT: 100 %
O2 SAT: 94 %
O2 SAT: 95 %
O2 SAT: 92 %
O2 SAT: 91 %
O2 Saturation: 100 %
PCO2 ART: 43.2 mmHg (ref 35.0–45.0)
PCO2 ART: 41.1 mmHg (ref 35.0–45.0)
PH ART: 7.334 — AB (ref 7.350–7.450)
PH ART: 7.3 — AB (ref 7.350–7.450)
PO2 ART: 278 mmHg — AB (ref 80.0–100.0)
PO2 ART: 305 mmHg — AB (ref 80.0–100.0)
PO2 ART: 75 mmHg — AB (ref 80.0–100.0)
PO2 ART: 66 mmHg — AB (ref 80.0–100.0)
Patient temperature: 36.4
Patient temperature: 37.1
TCO2: 24 mmol/L (ref 0–100)
TCO2: 24 mmol/L (ref 0–100)
TCO2: 23 mmol/L (ref 0–100)
TCO2: 24 mmol/L (ref 0–100)
TCO2: 21 mmol/L (ref 0–100)
TCO2: 21 mmol/L (ref 0–100)
pCO2 arterial: 43.2 mmHg (ref 35.0–45.0)
pCO2 arterial: 42.5 mmHg (ref 35.0–45.0)
pCO2 arterial: 40 mmHg (ref 35.0–45.0)
pCO2 arterial: 38.8 mmHg (ref 35.0–45.0)
pH, Arterial: 7.36 (ref 7.350–7.450)
pH, Arterial: 7.317 — ABNORMAL LOW (ref 7.350–7.450)
pH, Arterial: 7.339 — ABNORMAL LOW (ref 7.350–7.450)
pH, Arterial: 7.311 — ABNORMAL LOW (ref 7.350–7.450)
pO2, Arterial: 78 mmHg — ABNORMAL LOW (ref 80.0–100.0)
pO2, Arterial: 71 mmHg — ABNORMAL LOW (ref 80.0–100.0)

## 2014-12-09 LAB — POCT I-STAT 4, (NA,K, GLUC, HGB,HCT)
Glucose, Bld: 111 mg/dL — ABNORMAL HIGH (ref 70–99)
HCT: 33 % — ABNORMAL LOW (ref 39.0–52.0)
Hemoglobin: 11.2 g/dL — ABNORMAL LOW (ref 13.0–17.0)
POTASSIUM: 3.7 meq/L (ref 3.7–5.3)
SODIUM: 143 meq/L (ref 137–147)

## 2014-12-09 LAB — POCT I-STAT GLUCOSE
Glucose, Bld: 166 mg/dL — ABNORMAL HIGH (ref 70–99)
Operator id: 3408

## 2014-12-09 LAB — PROTIME-INR
INR: 1.26 (ref 0.00–1.49)
PROTHROMBIN TIME: 15.9 s — AB (ref 11.6–15.2)

## 2014-12-09 LAB — HEMOGLOBIN AND HEMATOCRIT, BLOOD
HCT: 31.4 % — ABNORMAL LOW (ref 39.0–52.0)
Hemoglobin: 10.9 g/dL — ABNORMAL LOW (ref 13.0–17.0)

## 2014-12-09 LAB — CBC
HCT: 40.4 % (ref 39.0–52.0)
HCT: 34 % — ABNORMAL LOW (ref 39.0–52.0)
HEMATOCRIT: 33 % — AB (ref 39.0–52.0)
HEMOGLOBIN: 11.7 g/dL — AB (ref 13.0–17.0)
HEMOGLOBIN: 11.1 g/dL — AB (ref 13.0–17.0)
Hemoglobin: 13.8 g/dL (ref 13.0–17.0)
MCH: 29.2 pg (ref 26.0–34.0)
MCH: 29.3 pg (ref 26.0–34.0)
MCH: 28.6 pg (ref 26.0–34.0)
MCHC: 34.2 g/dL (ref 30.0–36.0)
MCHC: 34.4 g/dL (ref 30.0–36.0)
MCHC: 33.6 g/dL (ref 30.0–36.0)
MCV: 85.4 fL (ref 78.0–100.0)
MCV: 85 fL (ref 78.0–100.0)
MCV: 85.1 fL (ref 78.0–100.0)
PLATELETS: 192 10*3/uL (ref 150–400)
Platelets: 164 10*3/uL (ref 150–400)
Platelets: 187 10*3/uL (ref 150–400)
RBC: 4.73 MIL/uL (ref 4.22–5.81)
RBC: 4 MIL/uL — AB (ref 4.22–5.81)
RBC: 3.88 MIL/uL — ABNORMAL LOW (ref 4.22–5.81)
RDW: 13.3 % (ref 11.5–15.5)
RDW: 13.1 % (ref 11.5–15.5)
RDW: 13.3 % (ref 11.5–15.5)
WBC: 6.8 10*3/uL (ref 4.0–10.5)
WBC: 14.2 10*3/uL — ABNORMAL HIGH (ref 4.0–10.5)
WBC: 13.7 10*3/uL — ABNORMAL HIGH (ref 4.0–10.5)

## 2014-12-09 LAB — CREATININE, SERUM
Creatinine, Ser: 0.76 mg/dL (ref 0.50–1.35)
GFR calc Af Amer: >90 mL/min (ref >90)
GFR calc non Af Amer: >90 mL/min (ref >90)

## 2014-12-09 LAB — BASIC METABOLIC PANEL
ANION GAP: 12 (ref 5–15)
BUN: 16 mg/dL (ref 6–23)
CALCIUM: 9.6 mg/dL (ref 8.4–10.5)
CHLORIDE: 100 meq/L (ref 96–112)
CO2: 25 mEq/L (ref 19–32)
CREATININE: 0.91 mg/dL (ref 0.50–1.35)
GFR calc non Af Amer: 90 mL/min — ABNORMAL LOW (ref >90)
Glucose, Bld: 160 mg/dL — ABNORMAL HIGH (ref 70–99)
Potassium: 4.5 mEq/L (ref 3.7–5.3)
Sodium: 137 mEq/L (ref 137–147)

## 2014-12-09 LAB — MRSA PCR SCREENING: MRSA by PCR: NEGATIVE

## 2014-12-09 LAB — APTT: APTT: 38 s — AB (ref 24–37)

## 2014-12-09 LAB — PLATELET COUNT: Platelets: 187 10*3/uL (ref 150–400)

## 2014-12-09 LAB — MAGNESIUM: Magnesium: 2.6 mg/dL — ABNORMAL HIGH (ref 1.5–2.5)

## 2014-12-09 SURGERY — CORONARY ARTERY BYPASS GRAFTING (CABG)
Anesthesia: General | Site: Chest

## 2014-12-09 MED ORDER — METOPROLOL TARTRATE 1 MG/ML IV SOLN
2.5000 mg | INTRAVENOUS | Status: DC | PRN
Start: 2014-12-09 — End: 2014-12-13

## 2014-12-09 MED ORDER — SUCCINYLCHOLINE CHLORIDE 20 MG/ML IJ SOLN
INTRAMUSCULAR | Status: AC
  Filled 2014-12-09: qty 1

## 2014-12-09 MED ORDER — PROTAMINE SULFATE 10 MG/ML IV SOLN
INTRAVENOUS | Status: AC
  Filled 2014-12-09: qty 5

## 2014-12-09 MED ORDER — FENTANYL CITRATE 0.05 MG/ML IJ SOLN
INTRAMUSCULAR | Status: AC
  Filled 2014-12-09: qty 5

## 2014-12-09 MED ORDER — METOPROLOL TARTRATE 12.5 MG HALF TABLET
12.5000 mg | ORAL_TABLET | Freq: Two times a day (BID) | ORAL | Status: DC
  Filled 2014-12-09 (×3): qty 1

## 2014-12-09 MED ORDER — DOCUSATE SODIUM 100 MG PO CAPS
200.0000 mg | ORAL_CAPSULE | Freq: Every day | ORAL | Status: DC
  Administered 2014-12-10 – 2014-12-13 (×4): 200 mg via ORAL
  Filled 2014-12-09 (×4): qty 2

## 2014-12-09 MED ORDER — SODIUM CHLORIDE 0.9 % IV SOLN
INTRAVENOUS | Status: DC
  Filled 2014-12-09: qty 2.5

## 2014-12-09 MED ORDER — PROPOFOL 10 MG/ML IV BOLUS
INTRAVENOUS | Status: AC
Start: 2014-12-09 — End: 2014-12-09
  Filled 2014-12-09: qty 20

## 2014-12-09 MED ORDER — SODIUM CHLORIDE 0.9 % IV SOLN
INTRAVENOUS | Status: DC | PRN
  Administered 2014-12-09: 13:00:00 via INTRAVENOUS

## 2014-12-09 MED ORDER — SODIUM CHLORIDE 0.45 % IV SOLN
INTRAVENOUS | Status: DC
  Administered 2014-12-09: 20 mL/h via INTRAVENOUS

## 2014-12-09 MED ORDER — MAGNESIUM SULFATE 4 GM/100ML IV SOLN
INTRAVENOUS | Status: AC
  Filled 2014-12-09: qty 100

## 2014-12-09 MED ORDER — PANTOPRAZOLE SODIUM 40 MG PO TBEC
40.0000 mg | DELAYED_RELEASE_TABLET | Freq: Every day | ORAL | Status: DC
  Administered 2014-12-11 – 2014-12-13 (×3): 40 mg via ORAL
  Filled 2014-12-09 (×3): qty 1

## 2014-12-09 MED ORDER — MIDAZOLAM HCL 10 MG/2ML IJ SOLN
INTRAMUSCULAR | Status: AC
  Filled 2014-12-09: qty 2

## 2014-12-09 MED ORDER — ROCURONIUM BROMIDE 100 MG/10ML IV SOLN
INTRAVENOUS | Status: DC | PRN
  Administered 2014-12-09: 30 mg via INTRAVENOUS
  Administered 2014-12-09: 100 mg via INTRAVENOUS

## 2014-12-09 MED ORDER — VECURONIUM BROMIDE 10 MG IV SOLR
INTRAVENOUS | Status: DC | PRN
  Administered 2014-12-09: 3 mg via INTRAVENOUS
  Administered 2014-12-09 (×2): 2 mg via INTRAVENOUS
  Administered 2014-12-09: 3 mg via INTRAVENOUS

## 2014-12-09 MED ORDER — MAGNESIUM SULFATE 4 GM/100ML IV SOLN
4.0000 g | Freq: Once | INTRAVENOUS | Status: AC
  Administered 2014-12-09: 4 g via INTRAVENOUS

## 2014-12-09 MED ORDER — ASPIRIN EC 325 MG PO TBEC
325.0000 mg | DELAYED_RELEASE_TABLET | Freq: Every day | ORAL | Status: DC
  Administered 2014-12-10 – 2014-12-13 (×4): 325 mg via ORAL
  Filled 2014-12-09 (×4): qty 1

## 2014-12-09 MED ORDER — SODIUM CHLORIDE 0.9 % IJ SOLN
10.0000 mL | Freq: Two times a day (BID) | INTRAMUSCULAR | Status: DC
  Administered 2014-12-09: 20 mL
  Administered 2014-12-10 – 2014-12-11 (×3): 10 mL

## 2014-12-09 MED ORDER — SODIUM CHLORIDE 0.9 % IV SOLN: 250.0000 mL | INTRAVENOUS | Status: DC

## 2014-12-09 MED ORDER — FAMOTIDINE IN NACL 20-0.9 MG/50ML-% IV SOLN
20.0000 mg | Freq: Two times a day (BID) | INTRAVENOUS | Status: AC
  Administered 2014-12-09 (×2): 20 mg via INTRAVENOUS
  Filled 2014-12-09: qty 50

## 2014-12-09 MED ORDER — SODIUM CHLORIDE 0.9 % IJ SOLN
3.0000 mL | Freq: Two times a day (BID) | INTRAMUSCULAR | Status: DC
  Administered 2014-12-09 – 2014-12-11 (×4): 3 mL via INTRAVENOUS

## 2014-12-09 MED ORDER — HEPARIN SODIUM (PORCINE) 1000 UNIT/ML IJ SOLN
INTRAMUSCULAR | Status: DC | PRN
  Administered 2014-12-09: 32000 [IU] via INTRAVENOUS

## 2014-12-09 MED ORDER — VANCOMYCIN HCL IN DEXTROSE 1-5 GM/200ML-% IV SOLN
1000.0000 mg | Freq: Once | INTRAVENOUS | Status: AC
  Administered 2014-12-09: 1000 mg via INTRAVENOUS
  Filled 2014-12-09: qty 200

## 2014-12-09 MED ORDER — 0.9 % SODIUM CHLORIDE (POUR BTL) OPTIME
TOPICAL | Status: DC | PRN
  Administered 2014-12-09: 6000 mL

## 2014-12-09 MED ORDER — POTASSIUM CHLORIDE 10 MEQ/50ML IV SOLN
10.0000 meq | INTRAVENOUS | Status: AC
  Administered 2014-12-09 (×3): 10 meq via INTRAVENOUS

## 2014-12-09 MED ORDER — MIDAZOLAM HCL 2 MG/2ML IJ SOLN: 2.0000 mg | INTRAMUSCULAR | Status: DC | PRN

## 2014-12-09 MED ORDER — DEXTROSE 5 % IV SOLN
1.5000 g | Freq: Two times a day (BID) | INTRAVENOUS | Status: AC
  Administered 2014-12-09 – 2014-12-11 (×4): 1.5 g via INTRAVENOUS
  Filled 2014-12-09 (×4): qty 1.5

## 2014-12-09 MED ORDER — HEPARIN SODIUM (PORCINE) 1000 UNIT/ML IJ SOLN
INTRAMUSCULAR | Status: AC
  Filled 2014-12-09: qty 1

## 2014-12-09 MED ORDER — ACETAMINOPHEN 650 MG RE SUPP
650.0000 mg | Freq: Once | RECTAL | Status: AC
  Administered 2014-12-09: 650 mg via RECTAL

## 2014-12-09 MED ORDER — SODIUM CHLORIDE 0.9 % IJ SOLN
OROMUCOSAL | Status: DC | PRN
  Administered 2014-12-09 (×3): 4 mL via TOPICAL

## 2014-12-09 MED ORDER — ATORVASTATIN CALCIUM 80 MG PO TABS
80.0000 mg | ORAL_TABLET | Freq: Every day | ORAL | Status: DC
  Administered 2014-12-10 – 2014-12-12 (×3): 80 mg via ORAL
  Filled 2014-12-09 (×4): qty 1

## 2014-12-09 MED ORDER — FENTANYL CITRATE 0.05 MG/ML IJ SOLN
INTRAMUSCULAR | Status: DC | PRN
  Administered 2014-12-09: 250 ug via INTRAVENOUS
  Administered 2014-12-09: 100 ug via INTRAVENOUS
  Administered 2014-12-09: 50 ug via INTRAVENOUS
  Administered 2014-12-09: 100 ug via INTRAVENOUS
  Administered 2014-12-09: 150 ug via INTRAVENOUS
  Administered 2014-12-09: 50 ug via INTRAVENOUS
  Administered 2014-12-09: 100 ug via INTRAVENOUS
  Administered 2014-12-09: 50 ug via INTRAVENOUS
  Administered 2014-12-09: 150 ug via INTRAVENOUS

## 2014-12-09 MED ORDER — ASPIRIN 81 MG PO CHEW
324.0000 mg | CHEWABLE_TABLET | Freq: Every day | ORAL | Status: DC
Start: 2014-12-10 — End: 2014-12-13
  Filled 2014-12-09: qty 4

## 2014-12-09 MED ORDER — FENTANYL CITRATE 0.05 MG/ML IJ SOLN
INTRAMUSCULAR | Status: AC
Start: 2014-12-09 — End: 2014-12-09
  Filled 2014-12-09: qty 5

## 2014-12-09 MED ORDER — NITROGLYCERIN IN D5W 200-5 MCG/ML-% IV SOLN: 0.0000 ug/min | INTRAVENOUS | Status: DC

## 2014-12-09 MED ORDER — BISACODYL 5 MG PO TBEC
10.0000 mg | DELAYED_RELEASE_TABLET | Freq: Every day | ORAL | Status: DC
  Administered 2014-12-10 – 2014-12-13 (×3): 10 mg via ORAL
  Filled 2014-12-09 (×3): qty 2

## 2014-12-09 MED ORDER — METOPROLOL TARTRATE 25 MG/10 ML ORAL SUSPENSION
12.5000 mg | Freq: Two times a day (BID) | ORAL | Status: DC
  Filled 2014-12-09 (×3): qty 5

## 2014-12-09 MED ORDER — LACTATED RINGERS IV SOLN
INTRAVENOUS | Status: DC | PRN
  Administered 2014-12-09 (×2): via INTRAVENOUS

## 2014-12-09 MED ORDER — OXYCODONE HCL 5 MG PO TABS
5.0000 mg | ORAL_TABLET | ORAL | Status: DC | PRN
  Administered 2014-12-10: 10 mg via ORAL
  Administered 2014-12-10: 5 mg via ORAL
  Administered 2014-12-10 – 2014-12-13 (×12): 10 mg via ORAL
  Filled 2014-12-09 (×3): qty 2
  Filled 2014-12-09: qty 1
  Filled 2014-12-09 (×10): qty 2

## 2014-12-09 MED ORDER — SODIUM CHLORIDE 0.9 % IV SOLN
INTRAVENOUS | Status: AC
  Administered 2014-12-09: 100 mL/h via INTRAVENOUS

## 2014-12-09 MED ORDER — ALBUMIN HUMAN 5 % IV SOLN
INTRAVENOUS | Status: DC | PRN
  Administered 2014-12-09: 13:00:00 via INTRAVENOUS

## 2014-12-09 MED ORDER — ACETAMINOPHEN 500 MG PO TABS
1000.0000 mg | ORAL_TABLET | Freq: Four times a day (QID) | ORAL | Status: DC
  Administered 2014-12-10 – 2014-12-13 (×14): 1000 mg via ORAL
  Filled 2014-12-09 (×18): qty 2

## 2014-12-09 MED ORDER — ACETAMINOPHEN 160 MG/5ML PO SOLN
1000.0000 mg | Freq: Four times a day (QID) | ORAL | Status: DC
  Filled 2014-12-09: qty 40

## 2014-12-09 MED ORDER — PHENYLEPHRINE HCL 10 MG/ML IJ SOLN
0.0000 ug/min | INTRAMUSCULAR | Status: DC
Start: 2014-12-09 — End: 2014-12-11
  Filled 2014-12-09 (×2): qty 2

## 2014-12-09 MED ORDER — DEXMEDETOMIDINE HCL IN NACL 200 MCG/50ML IV SOLN
0.0000 ug/kg/h | INTRAVENOUS | Status: DC
  Administered 2014-12-09: 0.5 ug/kg/h via INTRAVENOUS
  Filled 2014-12-09: qty 50

## 2014-12-09 MED ORDER — EPHEDRINE SULFATE 50 MG/ML IJ SOLN
INTRAMUSCULAR | Status: AC
  Filled 2014-12-09: qty 1

## 2014-12-09 MED ORDER — PROPOFOL 10 MG/ML IV BOLUS
INTRAVENOUS | Status: DC | PRN
  Administered 2014-12-09: 70 mg via INTRAVENOUS

## 2014-12-09 MED ORDER — LACTATED RINGERS IV SOLN: 500.0000 mL | Freq: Once | INTRAVENOUS | Status: AC | PRN

## 2014-12-09 MED ORDER — VECURONIUM BROMIDE 10 MG IV SOLR
INTRAVENOUS | Status: AC
  Filled 2014-12-09: qty 20

## 2014-12-09 MED ORDER — MIDAZOLAM HCL 5 MG/5ML IJ SOLN
INTRAMUSCULAR | Status: DC | PRN
  Administered 2014-12-09: 2 mg via INTRAVENOUS
  Administered 2014-12-09: 1 mg via INTRAVENOUS
  Administered 2014-12-09: 3 mg via INTRAVENOUS
  Administered 2014-12-09: 4 mg via INTRAVENOUS

## 2014-12-09 MED ORDER — ROCURONIUM BROMIDE 50 MG/5ML IV SOLN
INTRAVENOUS | Status: AC
  Filled 2014-12-09: qty 2

## 2014-12-09 MED ORDER — SODIUM CHLORIDE 0.9 % IJ SOLN
INTRAMUSCULAR | Status: AC
  Filled 2014-12-09: qty 10

## 2014-12-09 MED ORDER — TRAMADOL HCL 50 MG PO TABS
50.0000 mg | ORAL_TABLET | ORAL | Status: DC | PRN
  Administered 2014-12-11 (×2): 100 mg via ORAL
  Filled 2014-12-09 (×2): qty 2

## 2014-12-09 MED ORDER — ACETAMINOPHEN 160 MG/5ML PO SOLN: 650.0000 mg | Freq: Once | ORAL | Status: AC

## 2014-12-09 MED ORDER — MORPHINE SULFATE 2 MG/ML IJ SOLN
2.0000 mg | INTRAMUSCULAR | Status: DC | PRN
  Administered 2014-12-09 – 2014-12-10 (×4): 2 mg via INTRAVENOUS
  Administered 2014-12-10 (×2): 4 mg via INTRAVENOUS
  Administered 2014-12-10 (×2): 2 mg via INTRAVENOUS
  Administered 2014-12-10: 4 mg via INTRAVENOUS
  Administered 2014-12-10 (×2): 2 mg via INTRAVENOUS
  Filled 2014-12-09 (×2): qty 1
  Filled 2014-12-09: qty 2
  Filled 2014-12-09: qty 1
  Filled 2014-12-09: qty 2
  Filled 2014-12-09: qty 1

## 2014-12-09 MED ORDER — MORPHINE SULFATE 2 MG/ML IJ SOLN
1.0000 mg | INTRAMUSCULAR | Status: AC | PRN
  Administered 2014-12-09 (×5): 2 mg via INTRAVENOUS
  Filled 2014-12-09: qty 2
  Filled 2014-12-09 (×2): qty 1
  Filled 2014-12-09: qty 2
  Filled 2014-12-09 (×2): qty 1

## 2014-12-09 MED ORDER — ALBUMIN HUMAN 5 % IV SOLN
250.0000 mL | INTRAVENOUS | Status: AC | PRN
  Administered 2014-12-09 (×2): 250 mL via INTRAVENOUS

## 2014-12-09 MED ORDER — STERILE WATER FOR INJECTION IJ SOLN
INTRAMUSCULAR | Status: AC
Start: 2014-12-09 — End: 2014-12-09
  Filled 2014-12-09: qty 20

## 2014-12-09 MED ORDER — CETYLPYRIDINIUM CHLORIDE 0.05 % MT LIQD
7.0000 mL | Freq: Four times a day (QID) | OROMUCOSAL | Status: DC
  Administered 2014-12-10 – 2014-12-11 (×6): 7 mL via OROMUCOSAL

## 2014-12-09 MED ORDER — CHLORHEXIDINE GLUCONATE 0.12 % MT SOLN
15.0000 mL | Freq: Two times a day (BID) | OROMUCOSAL | Status: DC
  Administered 2014-12-09 – 2014-12-10 (×3): 15 mL via OROMUCOSAL
  Filled 2014-12-09 (×3): qty 15

## 2014-12-09 MED ORDER — BISACODYL 10 MG RE SUPP: 10.0000 mg | Freq: Every day | RECTAL | Status: DC

## 2014-12-09 MED ORDER — LACTATED RINGERS IV SOLN: INTRAVENOUS | Status: DC

## 2014-12-09 MED ORDER — INSULIN REGULAR BOLUS VIA INFUSION
0.0000 [IU] | Freq: Three times a day (TID) | INTRAVENOUS | Status: DC
  Administered 2014-12-10: 2 [IU] via INTRAVENOUS
  Filled 2014-12-09: qty 10

## 2014-12-09 MED ORDER — SODIUM CHLORIDE 0.9 % IV SOLN
INTRAVENOUS | Status: DC
  Administered 2014-12-10: 05:00:00 via INTRAVENOUS

## 2014-12-09 MED ORDER — SODIUM CHLORIDE 0.9 % IJ SOLN: 10.0000 mL | INTRAMUSCULAR | Status: DC | PRN

## 2014-12-09 MED ORDER — PROTAMINE SULFATE 10 MG/ML IV SOLN
INTRAVENOUS | Status: AC
  Filled 2014-12-09: qty 25

## 2014-12-09 MED ORDER — ONDANSETRON HCL 4 MG/2ML IJ SOLN: 4.0000 mg | Freq: Four times a day (QID) | INTRAMUSCULAR | Status: DC | PRN

## 2014-12-09 MED ORDER — SODIUM CHLORIDE 0.9 % IJ SOLN
3.0000 mL | INTRAMUSCULAR | Status: DC | PRN
  Administered 2014-12-09: 3 mL via INTRAVENOUS
  Filled 2014-12-09: qty 3

## 2014-12-09 MED FILL — Sodium Chloride IV Soln 0.9%: INTRAVENOUS | Qty: 2000 | Status: AC

## 2014-12-09 MED FILL — Sodium Bicarbonate IV Soln 8.4%: INTRAVENOUS | Qty: 50 | Status: AC

## 2014-12-09 MED FILL — Lidocaine HCl IV Inj 20 MG/ML: INTRAVENOUS | Qty: 5 | Status: AC

## 2014-12-09 MED FILL — Electrolyte-R (PH 7.4) Solution: INTRAVENOUS | Qty: 3000 | Status: AC

## 2014-12-09 MED FILL — Mannitol IV Soln 20%: INTRAVENOUS | Qty: 500 | Status: AC

## 2014-12-09 MED FILL — Heparin Sodium (Porcine) Inj 1000 Unit/ML: INTRAMUSCULAR | Qty: 10 | Status: AC

## 2014-12-09 SURGICAL SUPPLY — 127 items
ADAPTER CARDIO PERF ANTE/RETRO (ADAPTER) IMPLANT
ADPR PRFSN 84XANTGRD RTRGD (ADAPTER)
APL SKNCLS STERI-STRIP NONHPOA (GAUZE/BANDAGES/DRESSINGS) ×2
APPLIER CLIP 9.375 MED OPEN (MISCELLANEOUS)
APPLIER CLIP 9.375 SM OPEN (CLIP) ×6
APR CLP MED 9.3 20 MLT OPN (MISCELLANEOUS)
APR CLP SM 9.3 20 MLT OPN (CLIP) ×4
ATTRACTOMAT 16X20 MAGNETIC DRP (DRAPES) ×3 IMPLANT
BAG DECANTER FOR FLEXI CONT (MISCELLANEOUS) ×3 IMPLANT
BANDAGE ELASTIC 4 VELCRO ST LF (GAUZE/BANDAGES/DRESSINGS) ×3 IMPLANT
BANDAGE ELASTIC 6 VELCRO ST LF (GAUZE/BANDAGES/DRESSINGS) ×3 IMPLANT
BASKET HEART (ORDER IN 25'S) (MISCELLANEOUS) ×1
BASKET HEART (ORDER IN 25S) (MISCELLANEOUS) ×2 IMPLANT
BENZOIN TINCTURE PRP APPL 2/3 (GAUZE/BANDAGES/DRESSINGS) ×3 IMPLANT
BLADE STERNUM SYSTEM 6 (BLADE) ×3 IMPLANT
BLADE SURG ROTATE 9660 (MISCELLANEOUS) IMPLANT
BNDG GAUZE ELAST 4 BULKY (GAUZE/BANDAGES/DRESSINGS) ×3 IMPLANT
CANISTER SUCTION 2500CC (MISCELLANEOUS) ×3 IMPLANT
CANNULA EZ GLIDE AORTIC 21FR (CANNULA) ×6 IMPLANT
CANNULA GUNDRY RCSP 15FR (MISCELLANEOUS) IMPLANT
CANNULA VENOUS LOW PROF 34X46 (CANNULA) ×3 IMPLANT
CARDIAC SUCTION (MISCELLANEOUS) ×3 IMPLANT
CATH CPB KIT OWEN (MISCELLANEOUS) ×3 IMPLANT
CATH THORACIC 28FR (CATHETERS) IMPLANT
CATH THORACIC 28FR RT ANG (CATHETERS) IMPLANT
CATH THORACIC 36FR (CATHETERS) ×3 IMPLANT
CATH THORACIC 36FR RT ANG (CATHETERS) ×3 IMPLANT
CLIP APPLIE 9.375 MED OPEN (MISCELLANEOUS) IMPLANT
CLIP APPLIE 9.375 SM OPEN (CLIP) IMPLANT
CLIP FOGARTY SPRING 6M (CLIP) IMPLANT
CLIP RETRACTION 3.0MM CORONARY (MISCELLANEOUS) ×1 IMPLANT
CLIP TI MEDIUM 24 (CLIP) IMPLANT
CLIP TI WIDE RED SMALL 24 (CLIP) ×3 IMPLANT
CONN Y 3/8X3/8X3/8  BEN (MISCELLANEOUS)
CONN Y 3/8X3/8X3/8 BEN (MISCELLANEOUS) IMPLANT
COVER SURGICAL LIGHT HANDLE (MISCELLANEOUS) ×3 IMPLANT
CRADLE DONUT ADULT HEAD (MISCELLANEOUS) ×3 IMPLANT
DRAIN CHANNEL 32F RND 10.7 FF (WOUND CARE) ×3 IMPLANT
DRAPE CARDIOVASCULAR INCISE (DRAPES) ×3
DRAPE INCISE IOBAN 66X45 STRL (DRAPES) ×3 IMPLANT
DRAPE SLUSH/WARMER DISC (DRAPES) ×3 IMPLANT
DRAPE SRG 135X102X78XABS (DRAPES) ×2 IMPLANT
DRSG AQUACEL AG ADV 3.5X14 (GAUZE/BANDAGES/DRESSINGS) ×1 IMPLANT
DRSG COVADERM 4X14 (GAUZE/BANDAGES/DRESSINGS) ×3 IMPLANT
ELECT BLADE 4.0 EZ CLEAN MEGAD (MISCELLANEOUS) ×3
ELECT REM PT RETURN 9FT ADLT (ELECTROSURGICAL) ×6
ELECTRODE BLDE 4.0 EZ CLN MEGD (MISCELLANEOUS) IMPLANT
ELECTRODE REM PT RTRN 9FT ADLT (ELECTROSURGICAL) ×4 IMPLANT
GAUZE SPONGE 4X4 12PLY STRL (GAUZE/BANDAGES/DRESSINGS) ×6 IMPLANT
GLOVE BIO SURGEON STRL SZ 6 (GLOVE) ×4 IMPLANT
GLOVE BIO SURGEON STRL SZ 6.5 (GLOVE) IMPLANT
GLOVE BIO SURGEON STRL SZ7 (GLOVE) IMPLANT
GLOVE BIO SURGEON STRL SZ7.5 (GLOVE) ×2 IMPLANT
GLOVE BIO SURGEON STRL SZ8 (GLOVE) ×1 IMPLANT
GLOVE BIOGEL PI IND STRL 6 (GLOVE) IMPLANT
GLOVE BIOGEL PI IND STRL 6.5 (GLOVE) IMPLANT
GLOVE BIOGEL PI IND STRL 7.0 (GLOVE) IMPLANT
GLOVE BIOGEL PI INDICATOR 6 (GLOVE) ×2
GLOVE BIOGEL PI INDICATOR 6.5 (GLOVE)
GLOVE BIOGEL PI INDICATOR 7.0 (GLOVE) ×3
GLOVE EUDERMIC 7 POWDERFREE (GLOVE) IMPLANT
GLOVE ORTHO TXT STRL SZ7.5 (GLOVE) ×8 IMPLANT
GOWN STRL REUS W/ TWL LRG LVL3 (GOWN DISPOSABLE) ×8 IMPLANT
GOWN STRL REUS W/TWL LRG LVL3 (GOWN DISPOSABLE) ×24
HEMOSTAT POWDER SURGIFOAM 1G (HEMOSTASIS) ×9 IMPLANT
INSERT FOGARTY 61MM (MISCELLANEOUS) IMPLANT
INSERT FOGARTY XLG (MISCELLANEOUS) ×3 IMPLANT
KIT BASIN OR (CUSTOM PROCEDURE TRAY) ×3 IMPLANT
KIT ROOM TURNOVER OR (KITS) ×3 IMPLANT
KIT SUCTION CATH 14FR (SUCTIONS) ×16 IMPLANT
KIT VASOVIEW W/TROCAR VH 2000 (KITS) ×3 IMPLANT
LEAD PACING MYOCARDI (MISCELLANEOUS) ×3 IMPLANT
MARKER GRAFT CORONARY BYPASS (MISCELLANEOUS) ×9 IMPLANT
NS IRRIG 1000ML POUR BTL (IV SOLUTION) ×15 IMPLANT
PACK OPEN HEART (CUSTOM PROCEDURE TRAY) ×3 IMPLANT
PAD ARMBOARD 7.5X6 YLW CONV (MISCELLANEOUS) ×3 IMPLANT
PAD ELECT DEFIB RADIOL ZOLL (MISCELLANEOUS) ×3 IMPLANT
PENCIL BUTTON HOLSTER BLD 10FT (ELECTRODE) ×3 IMPLANT
PUNCH AORTIC ROT 4.0MM RCL 40 (MISCELLANEOUS) ×1 IMPLANT
PUNCH AORTIC ROTATE 4.0MM (MISCELLANEOUS) IMPLANT
PUNCH AORTIC ROTATE 4.5MM 8IN (MISCELLANEOUS) IMPLANT
PUNCH AORTIC ROTATE 5MM 8IN (MISCELLANEOUS) IMPLANT
SET CARDIOPLEGIA MPS 5001102 (MISCELLANEOUS) ×1 IMPLANT
SOLUTION ANTI FOG 6CC (MISCELLANEOUS) IMPLANT
SPONGE LAP 18X18 X RAY DECT (DISPOSABLE) ×1 IMPLANT
SPONGE LAP 4X18 X RAY DECT (DISPOSABLE) ×1 IMPLANT
SUT BONE WAX W31G (SUTURE) ×3 IMPLANT
SUT ETHIBOND X763 2 0 SH 1 (SUTURE) ×6 IMPLANT
SUT MNCRL AB 3-0 PS2 18 (SUTURE) ×6 IMPLANT
SUT MNCRL AB 4-0 PS2 18 (SUTURE) IMPLANT
SUT PDS AB 1 CTX 36 (SUTURE) ×6 IMPLANT
SUT PROLENE 2 0 SH DA (SUTURE) IMPLANT
SUT PROLENE 3 0 SH DA (SUTURE) ×3 IMPLANT
SUT PROLENE 3 0 SH1 36 (SUTURE) ×1 IMPLANT
SUT PROLENE 4 0 RB 1 (SUTURE) ×3
SUT PROLENE 4 0 SH DA (SUTURE) ×1 IMPLANT
SUT PROLENE 4-0 RB1 .5 CRCL 36 (SUTURE) IMPLANT
SUT PROLENE 5 0 C 1 36 (SUTURE) IMPLANT
SUT PROLENE 6 0 C 1 30 (SUTURE) ×3 IMPLANT
SUT PROLENE 7.0 RB 3 (SUTURE) ×17 IMPLANT
SUT PROLENE 8 0 BV175 6 (SUTURE) ×2 IMPLANT
SUT PROLENE BLUE 7 0 (SUTURE) ×5 IMPLANT
SUT PROLENE POLY MONO (SUTURE) IMPLANT
SUT SILK  1 MH (SUTURE) ×2
SUT SILK 1 MH (SUTURE) ×2 IMPLANT
SUT STEEL 6MS V (SUTURE) IMPLANT
SUT STEEL STERNAL CCS#1 18IN (SUTURE) IMPLANT
SUT STEEL SZ 6 DBL 3X14 BALL (SUTURE) IMPLANT
SUT VIC AB 1 CTX 36 (SUTURE)
SUT VIC AB 1 CTX36XBRD ANBCTR (SUTURE) IMPLANT
SUT VIC AB 2-0 CT1 27 (SUTURE) ×3
SUT VIC AB 2-0 CT1 TAPERPNT 27 (SUTURE) IMPLANT
SUT VIC AB 2-0 CTX 27 (SUTURE) IMPLANT
SUT VIC AB 3-0 SH 27 (SUTURE)
SUT VIC AB 3-0 SH 27X BRD (SUTURE) IMPLANT
SUT VIC AB 3-0 X1 27 (SUTURE) ×1 IMPLANT
SUT VICRYL 4-0 PS2 18IN ABS (SUTURE) IMPLANT
SUTURE E-PAK OPEN HEART (SUTURE) ×3 IMPLANT
SYSTEM SAHARA CHEST DRAIN ATS (WOUND CARE) ×3 IMPLANT
TAPE CLOTH SURG 4X10 WHT LF (GAUZE/BANDAGES/DRESSINGS) ×1 IMPLANT
TAPE PAPER 2X10 WHT MICROPORE (GAUZE/BANDAGES/DRESSINGS) ×1 IMPLANT
TOWEL OR 17X24 6PK STRL BLUE (TOWEL DISPOSABLE) ×6 IMPLANT
TOWEL OR 17X26 10 PK STRL BLUE (TOWEL DISPOSABLE) ×6 IMPLANT
TRAY FOLEY IC TEMP SENS 16FR (CATHETERS) ×3 IMPLANT
TUBING INSUFFLATION (TUBING) ×3 IMPLANT
UNDERPAD 30X30 INCONTINENT (UNDERPADS AND DIAPERS) ×3 IMPLANT
WATER STERILE IRR 1000ML POUR (IV SOLUTION) ×6 IMPLANT

## 2014-12-09 NOTE — Progress Notes (Signed)
S/p CABG x 4  extubated  BP 108/66 mmHg  Pulse 85  Temp(Src) 99 F (37.2 C) (Core (Comment))  Resp 19  Ht 5\' 11"  (1.803 m)  Wt 213 lb 3 oz (96.7 kg)  BMI 29.75 kg/m2  SpO2 93%   Intake/Output Summary (Last 24 hours) at 12/09/14 1926 Last data filed at 12/09/14 1800  Gross per 24 hour  Intake 5400.32 ml  Output   2900 ml  Net 2500.32 ml   Hct 33  Doing well early postop

## 2014-12-09 NOTE — Op Note (Signed)
CARDIOTHORACIC SURGERY OPERATIVE NOTE  Date of Procedure: 12/09/2014  Preoperative Diagnosis: Severe 3-vessel Coronary Artery Disease  Postoperative Diagnosis: Same  Procedure:    Coronary Artery Bypass Grafting x 4   Left Internal Mammary Artery to Distal Left Anterior Descending Coronary Artery  Saphenous Vein Graft to Distal Right Coronary Artery  Saphenous Vein Graft to Obtuse Marginal Branch of Left Circumflex Coronary Artery  Sapheonous Vein Graft to Diagonal Branch Coronary Artery  Endoscopic Vein Harvest from Right Thigh and Lower Leg  Surgeon: Valentina Gu. Roxy Manns, MD  Assistant: John Giovanni, PA-C  Anesthesia: Nolon Nations, MD  Operative Findings:  Ischemic cardiomyopathy with moderate LV systolic dysfunction, EF 70%  Good quality LIMA and SVG conduit for grafting  Fair to good quality target vessels for grafting   BRIEF CLINICAL NOTE AND INDICATIONS FOR SURGERY  Patient is a 63 year old white male with known history of coronary artery disease, hypertension, hyperlipidemia, type 2 diabetes mellitus, and remote history of tobacco use who presents with symptoms of new onset exertional angina and has been referred for possible surgical revascularization. The patient's cardiac history dates back to 2005 when he presented with symptoms of angina pectoris. He was found to have two-vessel coronary artery disease and underwent PCI and stenting of the left circumflex coronary artery followed by delayed attempt at PCI and stenting of the left anterior descending coronary artery. The patient states that he suffered a mild heart attack after the second PCI procedure but he recovered uneventfully. He has done well from a cardiac standpoint until recently. Approximately 2 weeks ago the patient began to experience classical symptoms of exertional angina. The patient describes substernal chest tightness associated with shortness of breath that brought on with physical activity while at  work. Over the past 2 weeks symptoms have accelerated in frequency and severity. He denies any symptoms of chest pain occurring at rest or with minimal activity. He has not had nocturnal angina. He denies any prolonged episodes of chest discomfort that would not resolve with rest. He was evaluated in the office on 12/02/2014 by Kerin Ransom in Dr. Radford Pax and scheduled for elective diagnostic catheterization. Catheterization performed earlier today by Dr. Angelena Form reveals severe three-vessel coronary artery disease with ischemic cardiomyopathy and moderate left ventricular systolic dysfunction, ejection fraction estimated at 35%. Cardiothoracic surgical consultation was requested.  The patient has been seen in consultation and counseled at length regarding the indications, risks and potential benefits of surgery.  All questions have been answered, and the patient provides full informed consent for the operation as described.    DETAILS OF THE OPERATIVE PROCEDURE  Preparation:  The patient is brought to the operating room on the above mentioned date and central monitoring was established by the anesthesia team including placement of Swan-Ganz catheter and radial arterial line. The patient is placed in the supine position on the operating table.  Intravenous antibiotics are administered. General endotracheal anesthesia is induced uneventfully. A Foley catheter is placed.  Baseline transesophageal echocardiogram was performed.  Findings were notable for moderate LV systolic dysfunction.  There was severe hypokinesis of the lateral wall and apex.  The LV was dilated.  EF was estimated 35%.  There was trivial mitral regurgitation.  No other significant abnormalities were noted.  The patient's chest, abdomen, both groins, and both lower extremities are prepared and draped in a sterile manner. A time out procedure is performed.   Surgical Approach and Conduit Harvest:  A median sternotomy incision was  performed and the left  internal mammary artery is dissected from the chest wall and prepared for bypass grafting. The left internal mammary artery is notably good quality conduit. Simultaneously, the greater saphenous vein is obtained from the patient's right thigh and leg using endoscopic vein harvest technique. The saphenous vein is notably good quality conduit. After removal of the saphenous vein, the small surgical incisions in the lower extremity are closed with absorbable suture. Following systemic heparinization, the left internal mammary artery was transected distally noted to have excellent flow.   Extracorporeal Cardiopulmonary Bypass and Myocardial Protection:  The pericardium is opened. The ascending aorta is normal in appearance. The ascending aorta and the right atrium are cannulated for cardioplegia bypass.  Adequate heparinization is verified.   A retrograde cardioplegia cannula is placed through the right atrium into the coronary sinus.  The entire pre-bypass portion of the operation was notable for stable hemodynamics.  Cardiopulmonary bypass was begun and the surface of the heart is inspected. Distal target vessels are selected for coronary artery bypass grafting. A cardioplegia cannula is placed in the ascending aorta.  A temperature probe was placed in the interventricular septum.  The patient is allowed to cool passively to [facility] systemic temperature.  The aortic cross clamp is applied and cold blood cardioplegia is delivered initially in an antegrade fashion through the aortic root.  Supplemental cardioplegia is given retrograde through the coronary sinus catheter.  Iced saline slush is applied for topical hypothermia.  The initial cardioplegic arrest is rapid with early diastolic arrest.  Repeat doses of cardioplegia are administered intermittently throughout the entire cross clamp portion of the operation through the aortic root, through the coronary sinus catheter, and through  subsequently placed vein grafts in order to maintain completely flat electrocardiogram and septal myocardial temperature below 15C.  Myocardial protection was felt to be excellent.  Coronary Artery Bypass Grafting:   The distal right coronary artery was grafted using a reversed saphenous vein graft in an end-to-side fashion.  At the site of distal anastomosis the target vessel was good quality and measured approximately 2.0 mm in diameter.  The obtuse marginal branch of the left circumflex coronary artery was grafted using a reversed saphenous vein graft in an end-to-side fashion.  At the site of distal anastomosis the target vessel was good quality and measured approximately 2.0 mm in diameter.  The diagonal branch of the left anterior descending coronary artery was grafted using a reversed saphenous vein graft in an end-to-side fashion.  At the site of distal anastomosis the target vessel was fair quality and measured approximately 1.5 mm in diameter.  The distal left anterior coronary artery was grafted with the left internal mammary artery in an end-to-side fashion.  At the site of distal anastomosis the target vessel was good quality and measured approximately 1.8 mm in diameter.  All proximal vein graft anastomoses were placed directly to the ascending aorta prior to removal of the aortic cross clamp.  The septal myocardial temperature rose rapidly after reperfusion of the left internal mammary artery graft.  The aortic cross clamp was removed after a total cross clamp time of 92 minutes.   Procedure Completion:  All proximal and distal coronary anastomoses were inspected for hemostasis and appropriate graft orientation. Epicardial pacing wires are fixed to the right ventricular outflow tract and to the right atrial appendage. The patient is rewarmed to 37C temperature. The patient is weaned and disconnected from cardiopulmonary bypass.  The patient's rhythm at separation from bypass was  sinus.  The  patient was weaned from cardiopulmonary bypass without any inotropic support. Total cardiopulmonary bypass time for the operation was 124 minutes.  Followup transesophageal echocardiogram performed after separation from bypass revealed no changes from the preoperative exam.  The aortic and venous cannula were removed uneventfully. Protamine was administered to reverse the anticoagulation. The mediastinum and pleural space were inspected for hemostasis and irrigated with saline solution. The mediastinum and the left pleural space were drained using 3 chest tubes placed through separate stab incisions inferiorly.  The soft tissues anterior to the aorta were reapproximated loosely. The sternum is closed with double strength sternal wire. The soft tissues anterior to the sternum were closed in multiple layers and the skin is closed with a running subcuticular skin closure.  The post-bypass portion of the operation was notable for stable rhythm and hemodynamics.  No blood products were administered during the operation.   Disposition:  The patient tolerated the procedure well and is transported to the surgical intensive care in stable condition. There are no intraoperative complications. All sponge instrument and needle counts are verified correct at completion of the operation.    Valentina Gu. Roxy Manns MD 12/09/2014 1:25 PM

## 2014-12-09 NOTE — Transfer of Care (Signed)
Immediate Anesthesia Transfer of Care Note  Patient: Zachary Ellis  Procedure(s) Performed: Procedure(s): CORONARY ARTERY BYPASS GRAFTING (CABG), ON PUMP, TIMES FOUR, USING LEFT INTERNAL MAMMARY ARTERY, RIGHT GREATER SAPHENOUS VEIN HARVESTED ENDOSCOPICALLY (N/A) INTRAOPERATIVE TRANSESOPHAGEAL ECHOCARDIOGRAM (N/A)  Patient Location: SICU  Anesthesia Type:General  Level of Consciousness: sedated and Patient remains intubated per anesthesia plan  Airway & Oxygen Therapy: Patient remains intubated per anesthesia plan and Patient placed on Ventilator (see vital sign flow sheet for setting)  Post-op Assessment: Report given to PACU RN  Post vital signs: Reviewed and stable  Complications: No apparent anesthesia complications

## 2014-12-09 NOTE — Progress Notes (Signed)
  Echocardiogram Echocardiogram Transesophageal has been performed.  Darlina Sicilian M 12/09/2014, 8:27 AM

## 2014-12-09 NOTE — OR Nursing (Signed)
First call to SICU at 1249.

## 2014-12-09 NOTE — Anesthesia Preprocedure Evaluation (Addendum)
Anesthesia Evaluation  Patient identified by MRN, date of birth, ID band Patient awake    Reviewed: Allergy & Precautions, H&P , NPO status , Patient's Chart, lab work & pertinent test results  Airway Mallampati: II  TM Distance: >3 FB Neck ROM: Full    Dental no notable dental hx. (+) Edentulous Upper   Pulmonary former smoker,  breath sounds clear to auscultation  Pulmonary exam normal       Cardiovascular hypertension, Pt. on medications and Pt. on home beta blockers + angina with exertion + CAD Rhythm:Regular Rate:Normal  Echo  - Left ventricle: The cavity size was normal. Wall thickness wasincreased in a pattern of mild LVH. Systolic function wasmoderately reduced. The estimated ejection fraction was in therange of 35% to 40%. There is inferolateral hypokinesis. Dopplerparameters are consistent with abnormal left ventricularrelaxation (grade 1 diastolic dysfunction). The E/e&' ratio is <8,suggesting normal LV filling pressure. - Aorta: The sinotubular junction is mildly dilated at 4.1 cm. Theaortic root measures within normal limits. - Left atrium: The atrium was normal in size.   Neuro/Psych negative neurological ROS  negative psych ROS   GI/Hepatic negative GI ROS, Neg liver ROS,   Endo/Other  diabetesHypothyroidism   Renal/GU Renal disease     Musculoskeletal negative musculoskeletal ROS (+)   Abdominal   Peds  Hematology negative hematology ROS (+)   Anesthesia Other Findings   Reproductive/Obstetrics negative OB ROS                         Anesthesia Physical Anesthesia Plan  ASA: IV  Anesthesia Plan: General   Post-op Pain Management:    Induction: Intravenous  Airway Management Planned: Oral ETT  Additional Equipment: Arterial line, CVP, PA Cath, Ultrasound Guidance Line Placement and TEE  Intra-op Plan:   Post-operative Plan: Post-operative  intubation/ventilation  Informed Consent: I have reviewed the patients History and Physical, chart, labs and discussed the procedure including the risks, benefits and alternatives for the proposed anesthesia with the patient or authorized representative who has indicated his/her understanding and acceptance.   Dental advisory given  Plan Discussed with: CRNA  Anesthesia Plan Comments:         Anesthesia Quick Evaluation

## 2014-12-09 NOTE — Anesthesia Procedure Notes (Signed)
Procedure Name: Intubation Date/Time: 12/09/2014 7:45 AM Performed by: Kyung Rudd Pre-anesthesia Checklist: Patient identified, Emergency Drugs available, Suction available, Patient being monitored and Timeout performed Patient Re-evaluated:Patient Re-evaluated prior to inductionOxygen Delivery Method: Circle system utilized Preoxygenation: Pre-oxygenation with 100% oxygen Intubation Type: IV induction Ventilation: Mask ventilation without difficulty and Oral airway inserted - appropriate to patient size Laryngoscope Size: Mac and 4 Grade View: Grade I Tube type: Oral Tube size: 8.0 mm Number of attempts: 1 Airway Equipment and Method: Stylet Placement Confirmation: ETT inserted through vocal cords under direct vision,  positive ETCO2 and breath sounds checked- equal and bilateral Secured at: 22 cm Tube secured with: Tape Dental Injury: Teeth and Oropharynx as per pre-operative assessment

## 2014-12-09 NOTE — Procedures (Signed)
Extubation Procedure Note  Patient Details:   Name: Zachary Ellis DOB: 1951/11/08 MRN: [id number]   Airway Documentation:     Evaluation  O2 sats: stable throughout Complications: No apparent complications Patient did tolerate procedure well. Bilateral Breath Sounds: Clear Suctioning: Oral, Airway Yes   Pt tolerate wean, positive for cuff leak, extubated to 4l [place]. No dyspnea or stridor noted after extubation. Pt achieved around 571mL IS x3. RT Will continue to monitor.   Mariam Dollar 12/09/2014, 6:51 PM

## 2014-12-09 NOTE — Brief Op Note (Addendum)
      [address]       [place] [address]             [phone]     12/07/2014 - 12/09/2014  11:31 AM  PATIENT:  Zachary Ellis  63 y.o. male  PRE-OPERATIVE DIAGNOSIS:  CAD  POST-OPERATIVE DIAGNOSIS:  Coronary Artery Disease  PROCEDURE:  Procedure(s): CORONARY ARTERY BYPASS GRAFTING (CABG)x4 LIMA-LAD; SVG-OM;SVG-RCA; SVG-DIAG INTRAOPERATIVE TRANSESOPHAGEAL ECHOCARDIOGRAM EVH: RIGHT LEG  SURGEON:    Rexene Alberts, MD  ASSISTANTS:  Jadene Pierini, PA-C  ANESTHESIA:   Nickie Retort, MD  CROSSCLAMP TIME:   4'  CARDIOPULMONARY BYPASS TIME: 124'  FINDINGS:  Ischemic cardiomyopathy with moderate LV systolic dysfunction, EF 09%  Good quality LIMA and SVG conduit for grafting  Fair to good quality target vessels for grafting  COMPLICATIONS: None  BASELINE WEIGHT: 96.7 kg  PATIENT DISPOSITION:   TO SICU IN STABLE CONDITION  [name],[name] 12/09/2014 1:21 PM

## 2014-12-09 NOTE — OR Nursing (Signed)
Second call to SICU at 1315.

## 2014-12-10 ENCOUNTER — Inpatient Hospital Stay (HOSPITAL_COMMUNITY): Payer: BC Managed Care – PPO

## 2014-12-10 DIAGNOSIS — Z951 Presence of aortocoronary bypass graft: Secondary | ICD-10-CM

## 2014-12-10 LAB — BASIC METABOLIC PANEL
Anion gap: 11 (ref 5–15)
BUN: 11 mg/dL (ref 6–23)
CO2: 21 meq/L (ref 19–32)
CREATININE: 0.75 mg/dL (ref 0.50–1.35)
Calcium: 8.2 mg/dL — ABNORMAL LOW (ref 8.4–10.5)
Chloride: 104 mEq/L (ref 96–112)
GFR calc non Af Amer: >90 mL/min (ref >90)
Glucose, Bld: 139 mg/dL — ABNORMAL HIGH (ref 70–99)
Potassium: 3.9 mEq/L (ref 3.7–5.3)
Sodium: 136 mEq/L — ABNORMAL LOW (ref 137–147)

## 2014-12-10 LAB — CBC
HCT: 32 % — ABNORMAL LOW (ref 39.0–52.0)
HEMATOCRIT: 33.1 % — AB (ref 39.0–52.0)
HEMOGLOBIN: 10.8 g/dL — AB (ref 13.0–17.0)
Hemoglobin: 10.8 g/dL — ABNORMAL LOW (ref 13.0–17.0)
MCH: 29.6 pg (ref 26.0–34.0)
MCH: 28.3 pg (ref 26.0–34.0)
MCHC: 33.8 g/dL (ref 30.0–36.0)
MCHC: 32.6 g/dL (ref 30.0–36.0)
MCV: 87.7 fL (ref 78.0–100.0)
MCV: 86.9 fL (ref 78.0–100.0)
PLATELETS: 193 10*3/uL (ref 150–400)
PLATELETS: 172 10*3/uL (ref 150–400)
RBC: 3.65 MIL/uL — ABNORMAL LOW (ref 4.22–5.81)
RBC: 3.81 MIL/uL — AB (ref 4.22–5.81)
RDW: 13.6 % (ref 11.5–15.5)
RDW: 14.1 % (ref 11.5–15.5)
WBC: 11.2 10*3/uL — ABNORMAL HIGH (ref 4.0–10.5)
WBC: 9.7 10*3/uL (ref 4.0–10.5)

## 2014-12-10 LAB — GLUCOSE, CAPILLARY
GLUCOSE-CAPILLARY: 116 mg/dL — AB (ref 70–99)
GLUCOSE-CAPILLARY: 129 mg/dL — AB (ref 70–99)
GLUCOSE-CAPILLARY: 158 mg/dL — AB (ref 70–99)
GLUCOSE-CAPILLARY: 92 mg/dL (ref 70–99)
Glucose-Capillary: 96 mg/dL (ref 70–99)
Glucose-Capillary: 92 mg/dL (ref 70–99)
Glucose-Capillary: 103 mg/dL — ABNORMAL HIGH (ref 70–99)
Glucose-Capillary: 121 mg/dL — ABNORMAL HIGH (ref 70–99)
Glucose-Capillary: 135 mg/dL — ABNORMAL HIGH (ref 70–99)
Glucose-Capillary: 125 mg/dL — ABNORMAL HIGH (ref 70–99)
Glucose-Capillary: 119 mg/dL — ABNORMAL HIGH (ref 70–99)
Glucose-Capillary: 118 mg/dL — ABNORMAL HIGH (ref 70–99)
Glucose-Capillary: 132 mg/dL — ABNORMAL HIGH (ref 70–99)
Glucose-Capillary: 131 mg/dL — ABNORMAL HIGH (ref 70–99)
Glucose-Capillary: 132 mg/dL — ABNORMAL HIGH (ref 70–99)
Glucose-Capillary: 119 mg/dL — ABNORMAL HIGH (ref 70–99)
Glucose-Capillary: 171 mg/dL — ABNORMAL HIGH (ref 70–99)
Glucose-Capillary: 164 mg/dL — ABNORMAL HIGH (ref 70–99)

## 2014-12-10 LAB — POCT I-STAT, CHEM 8
BUN: 10 mg/dL (ref 6–23)
Calcium, Ion: 1.17 mmol/L (ref 1.13–1.30)
Chloride: 106 mEq/L (ref 96–112)
Creatinine, Ser: 0.7 mg/dL (ref 0.50–1.35)
GLUCOSE: 195 mg/dL — AB (ref 70–99)
HCT: 34 % — ABNORMAL LOW (ref 39.0–52.0)
HEMOGLOBIN: 11.6 g/dL — AB (ref 13.0–17.0)
POTASSIUM: 4 meq/L (ref 3.7–5.3)
Sodium: 139 mEq/L (ref 137–147)
TCO2: 17 mmol/L (ref 0–100)

## 2014-12-10 LAB — CREATININE, SERUM
Creatinine, Ser: 0.75 mg/dL (ref 0.50–1.35)
GFR calc non Af Amer: >90 mL/min (ref >90)

## 2014-12-10 LAB — MAGNESIUM
Magnesium: 2.2 mg/dL (ref 1.5–2.5)
Magnesium: 2.1 mg/dL (ref 1.5–2.5)

## 2014-12-10 MED ORDER — FLUTICASONE PROPIONATE 50 MCG/ACT NA SUSP: 1.0000 | Freq: Every day | NASAL | Status: DC | PRN

## 2014-12-10 MED ORDER — INSULIN DETEMIR 100 UNIT/ML ~~LOC~~ SOLN
35.0000 [IU] | Freq: Every day | SUBCUTANEOUS | Status: DC
  Administered 2014-12-11: 35 [IU] via SUBCUTANEOUS
  Filled 2014-12-10: qty 0.35

## 2014-12-10 MED ORDER — INSULIN DETEMIR 100 UNIT/ML ~~LOC~~ SOLN
35.0000 [IU] | Freq: Once | SUBCUTANEOUS | Status: AC
  Administered 2014-12-10: 35 [IU] via SUBCUTANEOUS
  Filled 2014-12-10: qty 0.35

## 2014-12-10 MED ORDER — FUROSEMIDE 10 MG/ML IJ SOLN
40.0000 mg | Freq: Once | INTRAMUSCULAR | Status: AC
Start: 2014-12-10 — End: 2014-12-10
  Administered 2014-12-10: 40 mg via INTRAVENOUS
  Filled 2014-12-10: qty 4

## 2014-12-10 MED ORDER — CARVEDILOL 12.5 MG PO TABS
12.5000 mg | ORAL_TABLET | Freq: Two times a day (BID) | ORAL | Status: DC
Start: 2014-12-10 — End: 2014-12-12
  Administered 2014-12-10 – 2014-12-12 (×5): 12.5 mg via ORAL
  Filled 2014-12-10 (×7): qty 1

## 2014-12-10 MED ORDER — INSULIN ASPART 100 UNIT/ML ~~LOC~~ SOLN
0.0000 [IU] | SUBCUTANEOUS | Status: DC
  Administered 2014-12-10 (×3): 4 [IU] via SUBCUTANEOUS

## 2014-12-10 MED ORDER — ENOXAPARIN SODIUM 40 MG/0.4ML ~~LOC~~ SOLN: 40.0000 mg | Freq: Every day | SUBCUTANEOUS | Status: DC

## 2014-12-10 MED ORDER — POTASSIUM CHLORIDE 10 MEQ/50ML IV SOLN
10.0000 meq | INTRAVENOUS | Status: AC
  Administered 2014-12-10 (×3): 10 meq via INTRAVENOUS
  Filled 2014-12-10 (×3): qty 50

## 2014-12-10 MED ORDER — ENOXAPARIN SODIUM 40 MG/0.4ML ~~LOC~~ SOLN
40.0000 mg | SUBCUTANEOUS | Status: DC
  Administered 2014-12-10 – 2014-12-13 (×4): 40 mg via SUBCUTANEOUS
  Filled 2014-12-10 (×4): qty 0.4

## 2014-12-10 NOTE — Progress Notes (Signed)
1 Day Post-Op Procedure(s) (LRB): CORONARY ARTERY BYPASS GRAFTING (CABG), ON PUMP, TIMES FOUR, USING LEFT INTERNAL MAMMARY ARTERY, RIGHT GREATER SAPHENOUS VEIN HARVESTED ENDOSCOPICALLY (N/A) INTRAOPERATIVE TRANSESOPHAGEAL ECHOCARDIOGRAM (N/A) Subjective: Some incisional discomfort Denies nausea   Objective: Vital signs in last 24 hours: Temp:  [97.5 F (36.4 C)-99.1 F (37.3 C)] 99.1 F (37.3 C) (12/19 0835) Pulse Rate:  [77-98] 98 (12/19 0835) Cardiac Rhythm:  [-] Normal sinus rhythm;Bundle branch block (12/19 0800) Resp:  [10-28] 20 (12/19 0835) BP: (98-141)/(59-85) 119/73 mmHg (12/19 0800) SpO2:  [91 %-100 %] 94 % (12/19 0835) Arterial Line BP: (77-145)/(50-75) 145/67 mmHg (12/19 0835) FiO2 (%):  [40 %-50 %] 40 % (12/18 1800) Weight:  [229 lb 15 oz (104.3 kg)] 229 lb 15 oz (104.3 kg) (12/19 0600)  Hemodynamic parameters for last 24 hours: PAP: (26-53)/(8-26) 26/10 mmHg CO:  [3.8 L/min-6.7 L/min] 5.5 L/min CI:  [1.8 L/min/m2-3.1 L/min/m2] 2.5 L/min/m2  Intake/Output from previous day: 12/18 0701 - 12/19 0700 In: 7218.9 [P.O.:150; I.V.:5228.9; Blood:390; IV Piggyback:1450] Out: 9678 [Urine:2585; Blood:800; Chest Tube:970] Intake/Output this shift: Total I/O In: 62.8 [I.V.:62.8] Out: 125 [Urine:65; Chest Tube:60]  General appearance: alert and no distress Neurologic: intact Heart: regular rate and rhythm Lungs: diminished breath sounds bibasilar Abdomen: normal findings: soft, non-tender  Lab Results:  Recent Labs  12/09/14 2000 12/09/14 2001 12/10/14 0445  WBC 13.7*  --  11.2*  HGB 11.1* 11.2* 10.8*  HCT 33.0* 33.0* 32.0*  PLT 187  --  193   BMET:  Recent Labs  12/09/14 0436  12/09/14 2001 12/10/14 0445  NA 137  < > 141 136*  K 4.5  < > 4.2 3.9  CL 100  < > 107 104  CO2 25  --   --  21  GLUCOSE 160*  < > 161* 139*  BUN 16  < > 11 11  CREATININE 0.91  < > 0.80 0.75  CALCIUM 9.6  --   --  8.2*  < > = values in this interval not displayed.  PT/INR:   Recent Labs  12/09/14 1400  LABPROT 15.9*  INR 1.26   ABG    Component Value Date/Time   PHART 7.311* 12/09/2014 2006   HCO3 19.6* 12/09/2014 2006   TCO2 21 12/09/2014 2006   ACIDBASEDEF 6.0* 12/09/2014 2006   O2SAT 91.0 12/09/2014 2006   CBG (last 3)   Recent Labs  12/10/14 0510 12/10/14 0604 12/10/14 0704  GLUCAP 119* 116* 129*    Assessment/Plan: S/P Procedure(s) (LRB): CORONARY ARTERY BYPASS GRAFTING (CABG), ON PUMP, TIMES FOUR, USING LEFT INTERNAL MAMMARY ARTERY, RIGHT GREATER SAPHENOUS VEIN HARVESTED ENDOSCOPICALLY (N/A) INTRAOPERATIVE TRANSESOPHAGEAL ECHOCARDIOGRAM (N/A) -CV- stable, good hemodynamics- dc swan  Hypertensive- resume coreg  Asa, statin  RESP- IS for atelectasis  RENAL_ lytes creatinine OK- diurese  ENDO- CBG OK - change to levemir + SSI  Anemia expected ABL- follow  SCD + enoxaparin for DVT prophylaxis  DC CT   LOS: 3 days    Zachary Ellis C 12/10/2014

## 2014-12-10 NOTE — Progress Notes (Signed)
POD # 1  Stable day  BP 104/66 mmHg  Pulse 96  Temp(Src) 99.8 F (37.7 C) (Oral)  Resp 20  Ht 5\' 11"  (1.803 m)  Wt 229 lb 15 oz (104.3 kg)  BMI 32.08 kg/m2  SpO2 95%   Intake/Output Summary (Last 24 hours) at 12/10/14 1942 Last data filed at 12/10/14 1800  Gross per 24 hour  Intake 3391.15 ml  Output   2920 ml  Net 471.15 ml   K= 4.0 Hct = 34

## 2014-12-11 ENCOUNTER — Inpatient Hospital Stay (HOSPITAL_COMMUNITY): Payer: BC Managed Care – PPO

## 2014-12-11 LAB — BASIC METABOLIC PANEL
Anion gap: 11 (ref 5–15)
BUN: 15 mg/dL (ref 6–23)
CO2: 24 meq/L (ref 19–32)
Calcium: 8.3 mg/dL — ABNORMAL LOW (ref 8.4–10.5)
Chloride: 104 mEq/L (ref 96–112)
Creatinine, Ser: 0.72 mg/dL (ref 0.50–1.35)
GFR calc Af Amer: >90 mL/min (ref >90)
GLUCOSE: 110 mg/dL — AB (ref 70–99)
POTASSIUM: 4.1 meq/L (ref 3.7–5.3)
Sodium: 139 mEq/L (ref 137–147)

## 2014-12-11 LAB — GLUCOSE, CAPILLARY
GLUCOSE-CAPILLARY: 111 mg/dL — AB (ref 70–99)
GLUCOSE-CAPILLARY: 127 mg/dL — AB (ref 70–99)
GLUCOSE-CAPILLARY: 128 mg/dL — AB (ref 70–99)
Glucose-Capillary: 154 mg/dL — ABNORMAL HIGH (ref 70–99)
Glucose-Capillary: 172 mg/dL — ABNORMAL HIGH (ref 70–99)
Glucose-Capillary: 91 mg/dL (ref 70–99)
Glucose-Capillary: 130 mg/dL — ABNORMAL HIGH (ref 70–99)
Glucose-Capillary: 149 mg/dL — ABNORMAL HIGH (ref 70–99)
Glucose-Capillary: 164 mg/dL — ABNORMAL HIGH (ref 70–99)
Glucose-Capillary: 120 mg/dL — ABNORMAL HIGH (ref 70–99)

## 2014-12-11 LAB — CBC
HCT: 30.6 % — ABNORMAL LOW (ref 39.0–52.0)
Hemoglobin: 10.4 g/dL — ABNORMAL LOW (ref 13.0–17.0)
MCH: 30.1 pg (ref 26.0–34.0)
MCHC: 34 g/dL (ref 30.0–36.0)
MCV: 88.7 fL (ref 78.0–100.0)
PLATELETS: 145 10*3/uL — AB (ref 150–400)
RBC: 3.45 MIL/uL — ABNORMAL LOW (ref 4.22–5.81)
RDW: 14.2 % (ref 11.5–15.5)
WBC: 8.6 10*3/uL (ref 4.0–10.5)

## 2014-12-11 MED ORDER — SODIUM CHLORIDE 0.9 % IV SOLN: 250.0000 mL | INTRAVENOUS | Status: DC | PRN

## 2014-12-11 MED ORDER — INSULIN ASPART 100 UNIT/ML ~~LOC~~ SOLN
0.0000 [IU] | Freq: Three times a day (TID) | SUBCUTANEOUS | Status: DC
  Administered 2014-12-11 (×2): 2 [IU] via SUBCUTANEOUS
  Administered 2014-12-11: 3 [IU] via SUBCUTANEOUS
  Administered 2014-12-12: 2 [IU] via SUBCUTANEOUS
  Administered 2014-12-12: 3 [IU] via SUBCUTANEOUS
  Administered 2014-12-13: 2 [IU] via SUBCUTANEOUS
  Administered 2014-12-13: 3 [IU] via SUBCUTANEOUS

## 2014-12-11 MED ORDER — POTASSIUM CHLORIDE CRYS ER 20 MEQ PO TBCR
20.0000 meq | EXTENDED_RELEASE_TABLET | Freq: Every day | ORAL | Status: DC
  Administered 2014-12-12 – 2014-12-13 (×2): 20 meq via ORAL
  Filled 2014-12-11 (×2): qty 1

## 2014-12-11 MED ORDER — MOVING RIGHT ALONG BOOK
Freq: Once | Status: AC
  Administered 2014-12-11: 21:00:00
  Filled 2014-12-11: qty 1

## 2014-12-11 MED ORDER — SODIUM CHLORIDE 0.9 % IJ SOLN
3.0000 mL | Freq: Two times a day (BID) | INTRAMUSCULAR | Status: DC
  Administered 2014-12-11 – 2014-12-12 (×2): 3 mL via INTRAVENOUS

## 2014-12-11 MED ORDER — FUROSEMIDE 40 MG PO TABS
40.0000 mg | ORAL_TABLET | Freq: Every day | ORAL | Status: DC
  Administered 2014-12-12 – 2014-12-13 (×2): 40 mg via ORAL
  Filled 2014-12-11 (×2): qty 1

## 2014-12-11 MED ORDER — CETYLPYRIDINIUM CHLORIDE 0.05 % MT LIQD
7.0000 mL | Freq: Two times a day (BID) | OROMUCOSAL | Status: DC
  Administered 2014-12-11 – 2014-12-12 (×3): 7 mL via OROMUCOSAL

## 2014-12-11 MED ORDER — INSULIN DETEMIR 100 UNIT/ML ~~LOC~~ SOLN
30.0000 [IU] | Freq: Every day | SUBCUTANEOUS | Status: DC
  Filled 2014-12-11: qty 0.3

## 2014-12-11 MED ORDER — SODIUM CHLORIDE 0.9 % IJ SOLN: 3.0000 mL | INTRAMUSCULAR | Status: DC | PRN

## 2014-12-11 NOTE — Progress Notes (Signed)
[facility] DAILY ICU PROGRESS NOTE                   [place].[address]            ,[place] [address]          [phone]   2 Days Post-Op Procedure(s) (LRB): CORONARY ARTERY BYPASS GRAFTING (CABG), ON PUMP, TIMES FOUR, USING LEFT INTERNAL MAMMARY ARTERY, RIGHT GREATER SAPHENOUS VEIN HARVESTED ENDOSCOPICALLY (N/A) INTRAOPERATIVE TRANSESOPHAGEAL ECHOCARDIOGRAM (N/A)  Total Length of Stay:  LOS: 4 days   Subjective: Patient just finished eating dinner. No specific complaints  Objective: Vital signs in last 24 hours: Temp:  [98.2 F (36.8 C)-99.2 F (37.3 C)] 98.8 F (37.1 C) (12/20 1600) Pulse Rate:  [87-105] 99 (12/20 1700) Cardiac Rhythm:  [-] Sinus tachycardia (12/20 1600) Resp:  [12-23] 16 (12/20 1700) BP: (87-149)/(57-84) 125/79 mmHg (12/20 1700) SpO2:  [90 %-100 %] 90 % (12/20 1700) Arterial Line BP: (105-124)/(60-66) 109/61 mmHg (12/19 2000) Weight:  [229 lb 8 oz (104.1 kg)] 229 lb 8 oz (104.1 kg) (12/20 0600)  Filed Weights   12/09/14 0439 12/10/14 0600 12/11/14 0600  Weight: 213 lb 3 oz (96.7 kg) 229 lb 15 oz (104.3 kg) 229 lb 8 oz (104.1 kg)    Weight change: -7.1 oz (-0.2 kg)   Hemodynamic parameters for last 24 hours:    Intake/Output from previous day: 12/19 0701 - 12/20 0700 In: 2002 [P.O.:1200; I.V.:602; IV Piggyback:200] Out: 2240 [Urine:2020; Chest Tube:220]  Intake/Output this shift: Total I/O In: 400 [P.O.:360; I.V.:40] Out: 300 [Urine:300]  Current Meds: Scheduled Meds: . acetaminophen  1,000 mg Oral 4 times per day   Or  . acetaminophen (TYLENOL) oral liquid 160 mg/5 mL  1,000 mg Per Tube 4 times per day  . antiseptic oral rinse  7 mL Mouth Rinse BID  . aspirin EC  325 mg Oral Daily   Or  . aspirin  324 mg Per Tube Daily  . atorvastatin  80 mg Oral q1800  . bisacodyl  10 mg Oral Daily   Or  . bisacodyl  10 mg Rectal Daily  . carvedilol  12.5 mg Oral BID WC  . docusate sodium  200 mg Oral Daily  . enoxaparin (LOVENOX) injection   40 mg Subcutaneous Q24H  . insulin aspart  0-15 Units Subcutaneous TID WC  . insulin detemir  35 Units Subcutaneous Daily  . pantoprazole  40 mg Oral Daily  . sodium chloride  10-40 mL Intracatheter Q12H  . sodium chloride  3 mL Intravenous Q12H   Continuous Infusions: . sodium chloride Stopped (12/10/14 1100)  . sodium chloride    . sodium chloride 20 mL/hr at 12/10/14 1200  . dexmedetomidine Stopped (12/09/14 1730)  . insulin (NOVOLIN-R) infusion Stopped (12/10/14 1400)  . lactated ringers Stopped (12/10/14 0800)  . nitroGLYCERIN Stopped (12/09/14 1515)  . phenylephrine (NEO-SYNEPHRINE) Adult infusion Stopped (12/10/14 0600)   PRN Meds:.fluticasone, metoprolol, midazolam, morphine injection, ondansetron (ZOFRAN) IV, oxyCODONE, sodium chloride, sodium chloride, traMADol  General appearance: alert, cooperative and no distress Neurologic: intact Heart: regular rate and rhythm Lungs: Slightly diminshed at bases Abdomen: soft, non-tender; bowel sounds normal; no masses,  no organomegaly Extremities: Mild LE edema Wound: Aquacel intact;RLE drssing is clean and dry  Lab Results: CBC: Recent Labs  12/10/14 1700 12/10/14 1722 12/11/14 0438  WBC 9.7  --  8.6  HGB 10.8* 11.6* 10.4*  HCT 33.1* 34.0* 30.6*  PLT 172  --  145*   BMET:  Recent Labs  12/10/14 0445  12/10/14 1722 12/11/14 0438  NA 136*  --  139 139  K 3.9  --  4.0 4.1  CL 104  --  106 104  CO2 21  --   --  24  GLUCOSE 139*  --  195* 110*  BUN 11  --  10 15  CREATININE 0.75  < > 0.70 0.72  CALCIUM 8.2*  --   --  8.3*  < > = values in this interval not displayed.  PT/INR:  Recent Labs  12/09/14 1400  LABPROT 15.9*  INR 1.26   Radiology: Dg Chest Port 1 View  12/11/2014   CLINICAL DATA:  Status post CABG  EXAM: PORTABLE CHEST - 1 VIEW  COMPARISON:  12/09/2014  FINDINGS: Interval extubation. Interval removal of nasogastric tube. Right jugular sheath in satisfactory position.  Bibasilar linear airspace  disease likely reflecting atelectasis. Improved left lung aeration. Trace left pleural effusion. No pneumothorax. Stable cardiomediastinal silhouette. Recent CABG. No acute osseous abnormality.  IMPRESSION: 1. Interval extubation. 2. Bibasilar atelectasis with improved left lung base aeration. Trace left pleural effusion.   Electronically Signed   By: Kathreen Devoid   On: 12/11/2014 09:45   Dg Chest Port 1 View  12/10/2014   CLINICAL DATA:  Status post coronary artery bypass grafting for coronary artery disease  EXAM: PORTABLE CHEST - 1 VIEW  COMPARISON:  December 09, 2014  FINDINGS: Endotracheal tube and nasogastric tube have been removed. Swan-Ganz catheter tip is in the right main pulmonary artery. Left chest tube and mediastinal drain remain. No pneumothorax. There is atelectatic change in both lung bases and in the left mid lung. There may be modestly more atelectasis in the right base compared to 1 day prior. Changes on the left appear essentially unchanged. Heart is mildly enlarged with pulmonary vascularity within normal limits. No adenopathy.  IMPRESSION: Areas of atelectatic change bilaterally, slightly increased on the right and not felt to be changed on the left. Cardiac prominence stable. Tube and catheter positions as described without pneumothorax.   Electronically Signed   By: Lowella Grip M.D.   On: 12/10/2014 07:21     Assessment/Plan: S/P Procedure(s) (LRB): CORONARY ARTERY BYPASS GRAFTING (CABG), ON PUMP, TIMES FOUR, USING LEFT INTERNAL MAMMARY ARTERY, RIGHT GREATER SAPHENOUS VEIN HARVESTED ENDOSCOPICALLY (N/A) INTRAOPERATIVE TRANSESOPHAGEAL ECHOCARDIOGRAM (N/A)  1. CV-SR. On Coreg 12.5 bid. 2. Pulmonary-CXR shows bibasilar atelectasis, no pneumothorax. Encourage incentive spirometer and flutter valve 3. Volume overload-Give Lasix daily, starting in am 4.ABL anemia- H and H 10.4 and 30.6 5. DM-CBGs 130/149/164. Pre op HGA1C 7.1. On Insulin. Will restart Metformin soon. 6.  Will transfer to [place] PA-C 12/11/2014 5:52 PM

## 2014-12-12 ENCOUNTER — Encounter (HOSPITAL_COMMUNITY): Payer: Self-pay | Admitting: Thoracic Surgery (Cardiothoracic Vascular Surgery)

## 2014-12-12 ENCOUNTER — Inpatient Hospital Stay (HOSPITAL_COMMUNITY): Payer: BC Managed Care – PPO

## 2014-12-12 DIAGNOSIS — I2510 Atherosclerotic heart disease of native coronary artery without angina pectoris: Secondary | ICD-10-CM

## 2014-12-12 DIAGNOSIS — I10 Essential (primary) hypertension: Secondary | ICD-10-CM

## 2014-12-12 DIAGNOSIS — Z9861 Coronary angioplasty status: Secondary | ICD-10-CM

## 2014-12-12 LAB — BASIC METABOLIC PANEL
Anion gap: 13 (ref 5–15)
BUN: 17 mg/dL (ref 6–23)
CO2: 25 mEq/L (ref 19–32)
Calcium: 9 mg/dL (ref 8.4–10.5)
Chloride: 102 mEq/L (ref 96–112)
Creatinine, Ser: 0.68 mg/dL (ref 0.50–1.35)
GFR calc Af Amer: >90 mL/min (ref >90)
GLUCOSE: 127 mg/dL — AB (ref 70–99)
Potassium: 4 mEq/L (ref 3.7–5.3)
Sodium: 140 mEq/L (ref 137–147)

## 2014-12-12 LAB — GLUCOSE, CAPILLARY
Glucose-Capillary: 102 mg/dL — ABNORMAL HIGH (ref 70–99)
Glucose-Capillary: 151 mg/dL — ABNORMAL HIGH (ref 70–99)
Glucose-Capillary: 137 mg/dL — ABNORMAL HIGH (ref 70–99)
Glucose-Capillary: 129 mg/dL — ABNORMAL HIGH (ref 70–99)

## 2014-12-12 LAB — CBC
HCT: 31.4 % — ABNORMAL LOW (ref 39.0–52.0)
HEMOGLOBIN: 10.3 g/dL — AB (ref 13.0–17.0)
MCH: 28.7 pg (ref 26.0–34.0)
MCHC: 32.8 g/dL (ref 30.0–36.0)
MCV: 87.5 fL (ref 78.0–100.0)
Platelets: 179 10*3/uL (ref 150–400)
RBC: 3.59 MIL/uL — AB (ref 4.22–5.81)
RDW: 14 % (ref 11.5–15.5)
WBC: 10 10*3/uL (ref 4.0–10.5)

## 2014-12-12 MED ORDER — CARVEDILOL 6.25 MG PO TABS
18.7500 mg | ORAL_TABLET | Freq: Two times a day (BID) | ORAL | Status: DC
Start: 2014-12-12 — End: 2014-12-13
  Administered 2014-12-12 – 2014-12-13 (×2): 18.75 mg via ORAL
  Filled 2014-12-12 (×4): qty 1

## 2014-12-12 MED ORDER — LISINOPRIL 2.5 MG PO TABS
2.5000 mg | ORAL_TABLET | Freq: Every day | ORAL | Status: DC
  Administered 2014-12-12: 2.5 mg via ORAL
  Filled 2014-12-12 (×2): qty 1

## 2014-12-12 MED ORDER — METFORMIN HCL 500 MG PO TABS
500.0000 mg | ORAL_TABLET | Freq: Two times a day (BID) | ORAL | Status: DC
  Administered 2014-12-12: 500 mg via ORAL
  Filled 2014-12-12 (×4): qty 1

## 2014-12-12 MED ORDER — INSULIN DETEMIR 100 UNIT/ML ~~LOC~~ SOLN
15.0000 [IU] | Freq: Every day | SUBCUTANEOUS | Status: DC
  Administered 2014-12-12: 15 [IU] via SUBCUTANEOUS
  Filled 2014-12-12 (×2): qty 0.15

## 2014-12-12 MED FILL — Potassium Chloride Inj 2 mEq/ML: INTRAVENOUS | Qty: 40 | Status: AC

## 2014-12-12 MED FILL — Magnesium Sulfate Inj 50%: INTRAMUSCULAR | Qty: 10 | Status: AC

## 2014-12-12 MED FILL — Heparin Sodium (Porcine) Inj 1000 Unit/ML: INTRAMUSCULAR | Qty: 30 | Status: AC

## 2014-12-12 NOTE — Progress Notes (Signed)
CARDIAC REHAB PHASE I   PRE:  Rate/Rhythm:     BP: sitting     SaO2:   MODE:  Ambulation: ~400 ft   POST:  Rate/Rhythm: 121 ST    BP: sitting 129/83     SaO2: ? 89 RA, then 94 RA with rest  Pt up walking independently. Moving well, no c/o. HR elevated at 121 ST. Assisted back to recliner, took vitals and encouraged pt to walk x2 more and use IS. Doing well. [id number]   Josephina Shih [place] CES, ACSM 12/12/2014 10:21 AM

## 2014-12-12 NOTE — Progress Notes (Signed)
Patient Name: Zachary Ellis Date of Encounter: 12/12/2014  Principal Problem:   S/P CABG x 4 Active Problems:   Hypothyroidism   HYPERLIPIDEMIA, MIXED   Essential hypertension   CAD S/P CFX stent '05, failed LAD PCI '05   Type 2 diabetes    Unstable angina   Length of Stay: 5  SUBJECTIVE  The patient feels well, became tachycardiac with PT. Mild incisional pain.   CURRENT MEDS . acetaminophen  1,000 mg Oral 4 times per day   Or  . acetaminophen (TYLENOL) oral liquid 160 mg/5 mL  1,000 mg Per Tube 4 times per day  . antiseptic oral rinse  7 mL Mouth Rinse BID  . aspirin EC  325 mg Oral Daily   Or  . aspirin  324 mg Per Tube Daily  . atorvastatin  80 mg Oral q1800  . bisacodyl  10 mg Oral Daily   Or  . bisacodyl  10 mg Rectal Daily  . carvedilol  12.5 mg Oral BID WC  . docusate sodium  200 mg Oral Daily  . enoxaparin (LOVENOX) injection  40 mg Subcutaneous Q24H  . furosemide  40 mg Oral Daily  . insulin aspart  0-15 Units Subcutaneous TID WC  . insulin detemir  15 Units Subcutaneous Daily  . metFORMIN  500 mg Oral BID WC  . pantoprazole  40 mg Oral Daily  . potassium chloride  20 mEq Oral Daily  . sodium chloride  10-40 mL Intracatheter Q12H  . sodium chloride  3 mL Intravenous Q12H  . sodium chloride  3 mL Intravenous Q12H   OBJECTIVE  Filed Vitals:   12/11/14 1904 12/12/14 0240 12/12/14 0324 12/12/14 0828  BP: 128/74  125/72   Pulse: 99  101 106  Temp: 98.3 F (36.8 C)  98.5 F (36.9 C)   TempSrc: Oral  Oral   Resp: 19  18   Height:      Weight:  228 lb 6.4 oz (103.602 kg) 228 lb 6.4 oz (103.602 kg)   SpO2: 97%  92%     Intake/Output Summary (Last 24 hours) at 12/12/14 1041 Last data filed at 12/12/14 0840  Gross per 24 hour  Intake    560 ml  Output    575 ml  Net    -15 ml   Filed Weights   12/11/14 0600 12/12/14 0240 12/12/14 0324  Weight: 229 lb 8 oz (104.1 kg) 228 lb 6.4 oz (103.602 kg) 228 lb 6.4 oz (103.602 kg)   PHYSICAL  EXAM  General: Pleasant, NAD. Neuro: Alert and oriented X 3. Moves all extremities spontaneously. Psych: Normal affect. HEENT:  Normal  Neck: Supple without bruits or JVD. Lungs:  Resp regular and unlabored, CTA. Heart: RRR no s3, s4, or murmurs. Abdomen: Soft, non-tender, non-distended, BS + x 4.  Extremities: No clubbing, cyanosis or edema. DP/PT/Radials 2+ and equal bilaterally.  Accessory Clinical Findings  CBC  Recent Labs  12/11/14 0438 12/12/14 0522  WBC 8.6 10.0  HGB 10.4* 10.3*  HCT 30.6* 31.4*  MCV 88.7 87.5  PLT 145* 937   Basic Metabolic Panel  Recent Labs  12/10/14 0445 12/10/14 1700  12/11/14 0438 12/12/14 0522  NA 136*  --   < > 139 140  K 3.9  --   < > 4.1 4.0  CL 104  --   < > 104 102  CO2 21  --   --  24 25  GLUCOSE 139*  --   < >  110* 127*  BUN 11  --   < > 15 17  CREATININE 0.75 0.75  < > 0.72 0.68  CALCIUM 8.2*  --   --  8.3* 9.0  MG 2.2 2.1  --   --   --   < > = values in this interval not displayed.  Radiology/Studies  Dg Chest 2 View  12/12/2014   CLINICAL DATA:  Sore chest; status post CABG 3 days ago    IMPRESSION: There is persistent bibasilar atelectasis greater on the left than on the right. A small left pleural effusion persists. There has been slight interval improvement in the appearance of the pulmonary interstitium since the previous study.     Echo: 12/08/14 Study Conclusions  - Left ventricle: The cavity size was normal. Wall thickness was increased in a pattern of mild LVH. Systolic function was moderately reduced. The estimated ejection fraction was in the range of 35% to 40%. There is inferolateral hypokinesis. Doppler parameters are consistent with abnormal left ventricular relaxation (grade 1 diastolic dysfunction). The E/e&' ratio is <8, suggesting normal LV filling pressure. - Aorta: The sinotubular junction is mildly dilated at 4.1 cm. The aortic root measures within normal limits. - Left atrium:  The atrium was normal in size.  Impressions:  - LVEF 35-40%, inferior and lateral hypokinesis, diastolic dysfunction, normal LV filling pressure, mildly dilated STJ at 4.1 cm.  TELE: SR, PVCs, couplets    ASSESSMENT AND PLAN  1. CAD, s/p CABG on 12/18, doing well, still tachycardic, I would increase carvedilol to 18.75 mg PO BID, continue ASA and atorvastatin  2. Ischemic cardiomyopathy - start ACEI once BP improved, repeat echo in 6 weeks  3. Acute systolic CHF- continue the same dose of Lasix 40 mg po daily, with soft BP, hard to increase, Crea is stable   Signed, Dorothy Spark MD, [facility] 12/12/2014

## 2014-12-12 NOTE — Discharge Summary (Signed)
Physician Discharge Summary  Patient ID: IAM LIPSON MRN: [id number] DOB/AGE: [birth year] 63 y.o.  Admit date: 12/07/2014 Discharge date: 12/13/2014    Admission Diagnoses: Severe three vessel CAD   Discharge Diagnoses: Severe three-vessel CAD Principal Problem:   S/P CABG x 4 Active Problems:   Hypothyroidism   HYPERLIPIDEMIA, MIXED   Essential hypertension   CAD S/P CFX stent '05, failed LAD PCI '05   Type 2 diabetes    Unstable angina  Patient Active Problem List   Diagnosis Date Noted  . 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 Unstable angina 12/07/2014  . Exertional angina 12/02/2014  . Type 2 diabetes  12/02/2014  . Hypothyroidism 08/17/2009  . HYPERLIPIDEMIA, MIXED 08/17/2009  . Essential hypertension 08/17/2009  . CAD S/P CFX stent '05, failed LAD PCI '05 08/17/2009     Discharged Condition: good  History of the present illness: The patient is a 63 year old male with a previous history of MI and PCI of the circumflex in 2005. He also had an attempted LAD PCI that was unsuccessful. He has been stable from a cardiac viewpoint until approximately 2 weeks ago when he developed exertional chest tightness associated with dyspnea. The symptoms were relieved with rest. There was some radiation to both arms. He had no rest symptoms. He had a negative treadmill in February 2015. He was seen in cardiology office and subsequently scheduled for cardiac catheterization due to his history and increase in symptoms.   Hospital Course: The patient was admitted to the hospital and taken to the cardiac catheterization lab on 12/07/2014. He was found to have severe three-vessel coronary artery disease with moderate left main stenosis, occluded mid LAD, moderate proximal circumflex stenosis and severe proximal RCA stenosis. He was noted to have moderate segmental LV systolic dysfunction. Due to these findings cardiothoracic surgical consultation was obtained with Darylene Price M.D. who evaluated the  patient and his studies and agreed with recommendations to proceed with coronary artery surgical revascularization.  Consults: None  Significant Diagnostic Studies: angiography: cardiac catheterization  Treatment:   CARDIOTHORACIC SURGERY OPERATIVE NOTE  Date of Procedure:12/09/2014  Preoperative Diagnosis:Severe 3-vessel Coronary Artery Disease  Postoperative Diagnosis:Same  Procedure:   Coronary Artery Bypass Grafting x 4 Left Internal Mammary Artery to Distal Left Anterior Descending Coronary Artery Saphenous Vein Graft to Distal Right Coronary Artery Saphenous Vein Graft to Obtuse Marginal Branch of Left Circumflex Coronary Artery Sapheonous Vein Graft to Diagonal Branch Coronary Artery Endoscopic Vein Harvest from Right Thigh and Lower Leg  Surgeon:[name]. Roxy Manns, MD  Assistant:Wayne Orson Ape, PA-C  Anesthesia:John Lissa Hoard, MD  Operative Findings:  Ischemic cardiomyopathy with moderate LV systolic dysfunction, EF 61%  Good quality LIMA and SVG conduit for grafting Fair to good quality target vessels for grafting The patient tolerated the procedure well and is transported to the surgical intensive care in stable condition. There are no intraoperative complications. All sponge instrument and needle counts are verified correct at completion of the operation.  Postoperative hospital course:  Overall the patient has progressed nicely. He was weaned from the ventilator using standard protocols. All routine lines, monitors and drainage devices have been discontinued in the standard fashion. He has had no significant cardiac dysrhythmias. Oxygen has been weaned and he maintains adequate saturations on room air. He is tolerating gradually increasing activities using standard cardiac rehabilitation modalities. Incisions are noted to be healing well without evidence of infection. He  has a mild expected acute blood loss anemia which is stable. Renal function is  within normal limits. Glucose has been under good control using standard postoperative cardiac surgical protocols. His overall status is felt to be tentatively stable for discharge in the next 1-2 days pending ongoing reevaluation of his condition.    Discharge Exam: Blood pressure 135/87, pulse 93, temperature 98.9 F (37.2 C), temperature source Oral, resp. rate 18, height 5\' 11"  (1.803 m), weight 227 lb 11.2 oz (103.284 kg), SpO2 95 %. Heart: RRR Lungs: Clear Wound: Clean and dry Extremities: Trace LE edema  Disposition: home in good condition   Medications at time of discharge:   Medication List    STOP taking these medications        amLODipine 10 MG tablet  Commonly known as:  NORVASC     aspirin 81 MG tablet  Replaced by:  aspirin 325 MG EC tablet     isosorbide mononitrate 30 MG 24 hr tablet  Commonly known as:  IMDUR     simvastatin 40 MG tablet  Commonly known as:  ZOCOR      TAKE these medications        aspirin 325 MG EC tablet  Take 1 tablet (325 mg total) by mouth daily.     atorvastatin 80 MG tablet  Commonly known as:  LIPITOR  Take 1 tablet (80 mg total) by mouth daily.     carvedilol 12.5 MG tablet  Commonly known as:  COREG  Take 1.5 tablets (18.75 mg total) by mouth 2 (two) times daily with a meal. TAKE ONE TABLET BY MOUTH TWICE DAILY WITH A MEAL     fluticasone 50 MCG/ACT nasal spray  Commonly known as:  FLONASE  Place 1 spray into both nostrils daily as needed for allergies or rhinitis.     furosemide 40 MG tablet  Commonly known as:  LASIX  Take 1 tablet (40 mg total) by mouth daily. X 1 week     loratadine 10 MG tablet  Commonly known as:  CLARITIN  Take 10 mg by mouth daily.     metFORMIN 1000 MG tablet  Commonly known as:  GLUCOPHAGE  Take 1,000-1,500 mg by mouth 2 (two) times daily with a meal. *TAKES 1000MG  IN THE MORNING AND 1500MG  IN THE EVENING*      multivitamin per tablet  Take 1 tablet by mouth daily.     nitroGLYCERIN 0.4 MG SL tablet  Commonly known as:  NITROSTAT  Place 1 tablet (0.4 mg total) under the tongue every 5 (five) minutes as needed.     oxyCODONE 5 MG immediate release tablet  Commonly known as:  Oxy IR/ROXICODONE  Take 1-2 tablets (5-10 mg total) by mouth every 3 (three) hours as needed for severe pain.     potassium chloride SA 20 MEQ tablet  Commonly known as:  K-DUR,KLOR-CON  Take 1 tablet (20 mEq total) by mouth daily. X 1 week     quinapril 10 MG tablet  Commonly known as:  ACCUPRIL  Take 1 tablet (10 mg total) by mouth daily.        Follow Up: Follow-up Information    Follow up with Rexene Alberts, MD On 01/16/2015.   Specialty:  Cardiothoracic Surgery   Why:  01/16/2015 at 9:30 AM to see the surgeon. Please obtain a chest x-ray at [place] at 8:30 AM. [facility] imaging is located in the same office complex as the Psychologist, sport and exercise.   Contact information:   [place] [address] [phone]  Follow up with Lauree Chandler, MD On 01/13/2015.   Specialty:  Cardiology   Why:  Appointment is at 8:00   Contact information:   [place] [place] [address] [phone]      The patient has been discharged on:   1.Beta Blocker:  Yes Blue.Reese ]                              No   [   ]                              If No, reason:  2.Ace Inhibitor/ARB: Yes [ y  ]                                     No  [   ]                                     If No, reason: 3.Statin:   Yes [ y  ]                  No  [   ]                  If No, reason:  4.Ecasa:  Yes  [ y  ]                  No   [   ]                  If No, reason:  Signed: Josslynn Mentzer H 12/13/2014, 8:11 AM

## 2014-12-12 NOTE — Progress Notes (Signed)
Patient transferred to room 2W20; report given to receiving RN Benjaman Pott; pt resting in chair; call bell within reach; central tele notified of room and box exchange.  Rowe Pavy, RN

## 2014-12-12 NOTE — Discharge Instructions (Signed)
Endoscopic Saphenous Vein Harvesting °Care After °Refer to this sheet in the next few weeks. These instructions provide you with information on caring for yourself after your procedure. Your health care provider may also give you more specific instructions. Your treatment has been planned according to current medical practices, but problems sometimes occur. Call your health care provider if you have any problems or questions after your procedure. °HOME CARE INSTRUCTIONS °Medicine °· Take whatever pain medicine your surgeon prescribes. Follow the directions carefully. Do not take over-the-counter pain medicine unless your surgeon says it is okay. Some pain medicine can cause bleeding problems for several weeks after surgery. °· Follow your surgeon's instructions about driving. You will probably not be permitted to drive after heart surgery. °· Take any medicines your surgeon prescribes. Any medicines you took before your heart surgery should be checked with your health care provider before you start taking them again. °Wound care °· If your surgeon has prescribed an elastic bandage or stocking, ask how long you should wear it. °· Check the area around your surgical cuts (incisions) whenever your bandages (dressings) are changed. Look for any redness or swelling. °· You will need to return to have the stitches (sutures) or staples taken out. Ask your surgeon when to do that. °· Ask your surgeon when you can shower or bathe. °Activity °· Try to keep your legs raised when you are sitting. °· Do any exercises your health care providers have given you. These may include deep breathing exercises, coughing, walking, or other exercises. °SEEK MEDICAL CARE IF: °· You have any questions about your medicines. °· You have more leg pain, especially if your pain medicine stops working. °· New or growing bruises develop on your leg. °· Your leg swells, feels tight, or becomes red. °· You have numbness in your leg. °SEEK IMMEDIATE  MEDICAL CARE IF: °· Your pain gets much worse. °· Blood or fluid leaks from any of the incisions. °· Your incisions become warm, swollen, or red. °· You have chest pain. °· You have trouble breathing. °· You have a fever. °· You have more pain near your leg incision. °MAKE SURE YOU: °· Understand these instructions. °· Will watch your condition. °· Will get help right away if you are not doing well or get worse. °Document Released: 08/21/2011 Document Revised: 12/14/2013 Document Reviewed: 08/21/2011 °ExitCare® Patient Information ©2015 [employer]. This information is not intended to replace advice given to you by your health care provider. Make sure you discuss any questions you have with your health care provider. °Coronary Artery Bypass Grafting, Care After °These instructions give you information on caring for yourself after your procedure. Your doctor may also give you more specific instructions. Call your doctor if you have any problems or questions after your procedure.  °HOME CARE °· Only take medicine as told by your doctor. Take medicines exactly as told. Do not stop taking medicines or start any new medicines without talking to your doctor first. °· Take your pulse as told by your doctor. °· Do deep breathing as told by your doctor. Use your breathing device (incentive spirometer), if given, to practice deep breathing several times a day. Support your chest with a pillow or your arms when you take deep breaths or cough. °· Keep the area clean, dry, and protected where the surgery cuts (incisions) were made. Remove bandages (dressings) only as told by your doctor. If strips were applied to surgical area, do not take them off. They fall off   on their own. °· Check the surgery area daily for puffiness (swelling), redness, or leaking fluid. °· If surgery cuts were made in your legs: °· Avoid crossing your legs. °· Avoid sitting for long periods of time. Change positions every 30 minutes. °· Raise your legs  when you are sitting. Place them on pillows. °· Wear stockings that help keep blood clots from forming in your legs (compression stockings). °· Only take sponge baths until your doctor says it is okay to take showers. Pat the surgery area dry. Do not rub the surgery area with a washcloth or towel. Do not bathe, swim, or use a hot tub until your doctor says it is okay. °· Eat foods that are high in fiber. These include raw fruits and vegetables, whole grains, beans, and nuts. Choose lean meats. Avoid canned, processed, and fried foods. °· Drink enough fluids to keep your pee (urine) clear or pale yellow. °· Weigh yourself every day. °· Rest and limit activity as told by your doctor. You may be told to: °· Stop any activity if you have chest pain, shortness of breath, changes in heartbeat, or dizziness. Get help right away if this happens. °· Move around often for short amounts of time or take short walks as told by your doctor. Gradually become more active. You may need help to strengthen your muscles and build endurance. °· Avoid lifting, pushing, or pulling anything heavier than 10 pounds (4.5 kg) for at least 6 weeks after surgery. °· Do not drive until your doctor says it is okay. °· Ask your doctor when you can go back to work. °· Ask your doctor when you can begin sexual activity again. °· Follow up with your doctor as told. °GET HELP IF: °· You have puffiness, redness, more pain, or fluid draining from the incision site. °· You have a fever. °· You have puffiness in your ankles or legs. °· You have pain in your legs. °· You gain 2 or more pounds (0.9 kg) a day. °· You feel sick to your stomach (nauseous) or throw up (vomit). °· You have watery poop (diarrhea). °GET HELP RIGHT AWAY IF: °· You have chest pain that goes to your jaw or arms. °· You have shortness of breath. °· You have a fast or irregular heartbeat. °· You notice a "clicking" in your breastbone when you move. °· You have numbness or weakness in  your arms or legs. °· You feel dizzy or light-headed. °MAKE SURE YOU: °· Understand these instructions. °· Will watch your condition. °· Will get help right away if you are not doing well or get worse. °Document Released: 12/14/2013 Document Reviewed: 12/14/2013 °ExitCare® Patient Information ©2015 [employer]. This information is not intended to replace advice given to you by your health care provider. Make sure you discuss any questions you have with your health care provider. ° °

## 2014-12-12 NOTE — Anesthesia Postprocedure Evaluation (Signed)
Anesthesia Post Note  Patient: Zachary Ellis  Procedure(s) Performed: Procedure(s) (LRB): CORONARY ARTERY BYPASS GRAFTING (CABG), ON PUMP, TIMES FOUR, USING LEFT INTERNAL MAMMARY ARTERY, RIGHT GREATER SAPHENOUS VEIN HARVESTED ENDOSCOPICALLY (N/A) INTRAOPERATIVE TRANSESOPHAGEAL ECHOCARDIOGRAM (N/A)  Anesthesia type: General  Patient location: ICU  Post pain: Pain level controlled  Post assessment: Post-op Vital signs reviewed  Last Vitals: BP 125/72 mmHg  Pulse 106  Temp(Src) 36.9 C (Oral)  Resp 18  Ht 5\' 11"  (1.803 m)  Wt 228 lb 6.4 oz (103.602 kg)  BMI 31.87 kg/m2  SpO2 92%  Post vital signs: Reviewed  Level of consciousness: sedated  Complications: No apparent anesthesia complications

## 2014-12-12 NOTE — Progress Notes (Addendum)
[address]       [place] [address]             [phone]          3 Days Post-Op Procedure(s) (LRB): CORONARY ARTERY BYPASS GRAFTING (CABG), ON PUMP, TIMES FOUR, USING LEFT INTERNAL MAMMARY ARTERY, RIGHT GREATER SAPHENOUS VEIN HARVESTED ENDOSCOPICALLY (N/A) INTRAOPERATIVE TRANSESOPHAGEAL ECHOCARDIOGRAM (N/A)  Subjective: Feels well, no complaints.   Objective: Vital signs in last 24 hours: Patient Vitals for the past 24 hrs:  BP Temp Temp src Pulse Resp SpO2 Weight  12/12/14 0828 - - - (!) 106 - - -  12/12/14 0324 125/72 mmHg 98.5 F (36.9 C) Oral (!) 101 18 92 % 228 lb 6.4 oz (103.602 kg)  12/12/14 0240 - - - - - - 228 lb 6.4 oz (103.602 kg)  12/11/14 1904 128/74 mmHg 98.3 F (36.8 C) Oral 99 19 97 % -  12/11/14 1800 92/68 mmHg - - (!) 103 13 93 % -  12/11/14 1700 125/79 mmHg - - 99 16 90 % -  12/11/14 1600 125/84 mmHg 98.8 F (37.1 C) Oral (!) 105 14 96 % -  12/11/14 1500 108/68 mmHg - - 97 15 94 % -  12/11/14 1400 98/62 mmHg - - 99 14 94 % -  12/11/14 1300 116/75 mmHg - - (!) 102 18 94 % -  12/11/14 1200 126/72 mmHg - - 97 17 91 % -  12/11/14 1100 121/73 mmHg 98.6 F (37 C) Oral 96 20 92 % -  12/11/14 1000 117/65 mmHg - - 96 19 93 % -   Current Weight  12/12/14 228 lb 6.4 oz (103.602 kg)  BASELINE WEIGHT:96.7 kg   Intake/Output from previous day: 12/20 0701 - 12/21 0700 In: 520 [P.O.:480; I.V.:40] Out: 725 [Urine:725] CBGs 120-127-102   PHYSICAL EXAM:  Heart: RRR Lungs: Clear Wound: Clean and dry Extremities: Mild LE edema    Lab Results: CBC: Recent Labs  12/11/14 0438 12/12/14 0522  WBC 8.6 10.0  HGB 10.4* 10.3*  HCT 30.6* 31.4*  PLT 145* 179   BMET:  Recent Labs  12/11/14 0438 12/12/14 0522  NA 139 140  K 4.1 4.0  CL 104 102  CO2 24 25  GLUCOSE 110* 127*  BUN 15 17  CREATININE 0.72 0.68  CALCIUM 8.3* 9.0    PT/INR:  Recent Labs  12/09/14 1400  LABPROT 15.9*  INR 1.26   CXR:  FINDINGS: The lungs are adequately inflated. There has is persistent bibasilar atelectasis with small bilateral pleural effusions. The cardiac silhouette is enlarged. The pulmonary vascularity is not engorged. The mediastinum is mildly widened consistent with previous CABG. There are 8 intact sternal wires. The trachea is midline. The right internal jugular Cordis sheath has been removed. There is stable mild loss of height of an upper thoracic vertebral body.  IMPRESSION: There is persistent bibasilar atelectasis greater on the left than on the right. A small left pleural effusion persists. There has been slight interval improvement in the appearance of the pulmonary interstitium since the previous study.   Assessment/Plan: S/P Procedure(s) (LRB): CORONARY ARTERY BYPASS GRAFTING (CABG), ON PUMP, TIMES FOUR, USING LEFT INTERNAL MAMMARY ARTERY, RIGHT GREATER SAPHENOUS VEIN HARVESTED ENDOSCOPICALLY (N/A) INTRAOPERATIVE TRANSESOPHAGEAL ECHOCARDIOGRAM (N/A)  CV- mildly tachy this am. SBPs have been low normal.  Will continue current beta blocker dose and monitor.  May need to adjust as BP tolerates. BPs too low to resume ACE-I yet.  Vol  overload- continue diuresis.  DM- decrease Levemir and restart low dose Metformin.  Pulm toilet, CRPI.  Home 1-2 days if remains stable.   LOS: 5 days    COLLINS,GINA H 12/12/2014  Good progress Walking well Poss home in am I have seen and examined Domingo Sep and agree with the above assessment  and plan.  Grace Isaac MD [facility] [phone] Office [phone] 12/12/2014 2:45 PM

## 2014-12-13 LAB — GLUCOSE, CAPILLARY
Glucose-Capillary: 139 mg/dL — ABNORMAL HIGH (ref 70–99)
Glucose-Capillary: 160 mg/dL — ABNORMAL HIGH (ref 70–99)

## 2014-12-13 MED ORDER — OXYCODONE HCL 5 MG PO TABS: 5.0000 mg | ORAL_TABLET | ORAL | Status: DC | PRN

## 2014-12-13 MED ORDER — QUINAPRIL HCL 10 MG PO TABS: 10.0000 mg | ORAL_TABLET | Freq: Every day | ORAL | Status: DC

## 2014-12-13 MED ORDER — LISINOPRIL 10 MG PO TABS
10.0000 mg | ORAL_TABLET | Freq: Every day | ORAL | Status: DC
  Administered 2014-12-13: 10 mg via ORAL
  Filled 2014-12-13: qty 1

## 2014-12-13 MED ORDER — CARVEDILOL 12.5 MG PO TABS: 18.7500 mg | ORAL_TABLET | Freq: Two times a day (BID) | ORAL | Status: DC

## 2014-12-13 MED ORDER — FUROSEMIDE 40 MG PO TABS: 40.0000 mg | ORAL_TABLET | Freq: Every day | ORAL | Status: DC

## 2014-12-13 MED ORDER — ATORVASTATIN CALCIUM 80 MG PO TABS: 80.0000 mg | ORAL_TABLET | Freq: Every day | ORAL | Status: DC

## 2014-12-13 MED ORDER — ASPIRIN 325 MG PO TBEC: 325.0000 mg | DELAYED_RELEASE_TABLET | Freq: Every day | ORAL | Status: DC

## 2014-12-13 MED ORDER — METFORMIN HCL 500 MG PO TABS
1000.0000 mg | ORAL_TABLET | Freq: Two times a day (BID) | ORAL | Status: DC
  Filled 2014-12-13 (×2): qty 2

## 2014-12-13 MED ORDER — POTASSIUM CHLORIDE CRYS ER 20 MEQ PO TBCR: 20.0000 meq | EXTENDED_RELEASE_TABLET | Freq: Every day | ORAL | Status: DC

## 2014-12-13 NOTE — Progress Notes (Signed)
Pt discharged home  Discharge instructions given & reviewed Eduction discussed  IV dc'd  Tele dc'd  Pt discharged via wheelchair with NT, all pt belongings at side.  Pain medication given prior to discharge. Sherrie Mustache 3:16 PM

## 2014-12-13 NOTE — Progress Notes (Signed)
[id number] Pt walked independently and stated sats were checked by staff after he walked. Completed education with pt who voiced understanding. Encouraged IS. Discussed carb counting and encouraged pt to watch sodium. Pt stated he weighs daily. Encouraged pt to notify cardiologist if he gains 3 pounds in a day due to low EF. Discussed CRP 2 and pt agreed to referral to [facility].  Encouraged pt to watch post op video when wife here.  Graylon Good RN BSN 12/13/2014 9:47 AM

## 2014-12-13 NOTE — Progress Notes (Addendum)
       [address]       [place] [address]             [phone]          4 Days Post-Op Procedure(s) (LRB): CORONARY ARTERY BYPASS GRAFTING (CABG), ON PUMP, TIMES FOUR, USING LEFT INTERNAL MAMMARY ARTERY, RIGHT GREATER SAPHENOUS VEIN HARVESTED ENDOSCOPICALLY (N/A) INTRAOPERATIVE TRANSESOPHAGEAL ECHOCARDIOGRAM (N/A)  Subjective: Just back from walking in hall independently.  Feels well, no complaints.   Objective: Vital signs in last 24 hours: Patient Vitals for the past 24 hrs:  BP Temp Temp src Pulse Resp SpO2 Height Weight  12/13/14 0458 135/87 mmHg 98.9 F (37.2 C) Oral 93 18 95 % - -  12/13/14 0300 - - - - - - 5\' 11"  (1.803 m) 227 lb 11.2 oz (103.284 kg)  12/12/14 2057 122/81 mmHg 98.4 F (36.9 C) Oral 89 16 96 % - -  12/12/14 1700 132/84 mmHg - - 98 - - - -  12/12/14 1512 119/73 mmHg 98.7 F (37.1 C) Oral 95 16 95 % - -  12/12/14 0828 - - - (!) 106 - - - -   Current Weight  12/13/14 227 lb 11.2 oz (103.284 kg)   BASELINE WEIGHT:96.7 kg  Intake/Output from previous day: 12/21 0701 - 12/22 0700 In: 450 [P.O.:450] Out: -   CBGs 137-129-139   PHYSICAL EXAM:  Heart: RRR Lungs: Clear Wound: Clean and dry Extremities: Trace LE edema    Lab Results: CBC: Recent Labs  12/11/14 0438 12/12/14 0522  WBC 8.6 10.0  HGB 10.4* 10.3*  HCT 30.6* 31.4*  PLT 145* 179   BMET:  Recent Labs  12/11/14 0438 12/12/14 0522  NA 139 140  K 4.1 4.0  CL 104 102  CO2 24 25  GLUCOSE 110* 127*  BUN 15 17  CREATININE 0.72 0.68  CALCIUM 8.3* 9.0    PT/INR: No results for input(s): LABPROT, INR in the last 72 hours.    Assessment/Plan: S/P Procedure(s) (LRB): CORONARY ARTERY BYPASS GRAFTING (CABG), ON PUMP, TIMES FOUR, USING LEFT INTERNAL MAMMARY ARTERY, RIGHT GREATER SAPHENOUS VEIN HARVESTED ENDOSCOPICALLY (N/A) INTRAOPERATIVE TRANSESOPHAGEAL ECHOCARDIOGRAM (N/A)  CV- still gets tachy into low 100s with activity. SBPs trending up.  Coreg dose increased yesterday by cardiology, will watch HR.  Increase ACE-I.  Vol overload- continue diuresis.  DM- d/c Levemir and increase Metformin.  Will d/c EPWs this am.  Home after lunch today if he remains stable.   LOS: 6 days    Keagen Heinlen H 12/13/2014

## 2014-12-19 ENCOUNTER — Ambulatory Visit: Payer: BC Managed Care – PPO | Admitting: Cardiology

## 2014-12-29 DIAGNOSIS — Z736 Limitation of activities due to disability: Secondary | ICD-10-CM

## 2015-01-12 ENCOUNTER — Other Ambulatory Visit: Payer: Self-pay | Admitting: Thoracic Surgery (Cardiothoracic Vascular Surgery)

## 2015-01-12 ENCOUNTER — Encounter: Payer: Self-pay | Admitting: *Deleted

## 2015-01-12 DIAGNOSIS — I200 Unstable angina: Secondary | ICD-10-CM

## 2015-01-13 ENCOUNTER — Ambulatory Visit: Payer: BC Managed Care – PPO | Admitting: Cardiovascular Disease

## 2015-01-13 ENCOUNTER — Ambulatory Visit: Payer: Self-pay | Admitting: Cardiovascular Disease

## 2015-01-16 ENCOUNTER — Ambulatory Visit (INDEPENDENT_AMBULATORY_CARE_PROVIDER_SITE_OTHER): Payer: Self-pay | Admitting: Thoracic Surgery (Cardiothoracic Vascular Surgery)

## 2015-01-16 ENCOUNTER — Ambulatory Visit
Admission: RE | Admit: 2015-01-16 | Discharge: 2015-01-16 | Disposition: A | Discharge status: Home / Self Care | Payer: BLUE CROSS/BLUE SHIELD | Source: Ambulatory Visit | Attending: Thoracic Surgery (Cardiothoracic Vascular Surgery) | Admitting: Thoracic Surgery (Cardiothoracic Vascular Surgery)

## 2015-01-16 ENCOUNTER — Encounter: Payer: Self-pay | Admitting: Thoracic Surgery (Cardiothoracic Vascular Surgery)

## 2015-01-16 VITALS — BP 160/109 | HR 86 | Resp 16 | Ht 71.0 in | Wt 200.0 lb

## 2015-01-16 DIAGNOSIS — Z951 Presence of aortocoronary bypass graft: Secondary | ICD-10-CM

## 2015-01-16 DIAGNOSIS — I200 Unstable angina: Secondary | ICD-10-CM

## 2015-01-16 DIAGNOSIS — I25119 Atherosclerotic heart disease of native coronary artery with unspecified angina pectoris: Secondary | ICD-10-CM

## 2015-01-16 NOTE — Progress Notes (Signed)
[address]       [place] [address]             [phone]     CARDIOTHORACIC SURGERY OFFICE NOTE  Referring Provider is Lauree Chandler D*MD PCP is [name], MD   HPI:  Zachary Ellis is a 64 yo male with known history of coronary artery disease, hypertension, hyperlipidemia, type 2 diabetes mellitus, and remote history of tobacco use here for 1 month follow up s/p CABG x 4 for Unstable Exertional Angina and 3 vessel disease, Ischemic cardiomyopathy with moderate LV systolic dysfunction, EF 88%.  He tolerated the procedure without complication.  His post-op care was uneventful.  He is feeling very well today.  He denies: chest pain at rest or with exertion, palpitations, SOB, PND, orthopnea, incisional drainage/redness/pain, fever, and swelling of extremities.     He has d/c'd his pain medications and has been taking all prescribed medications as directed with the exception of Lipitor which has caused GI upset.  He plans to discuss starting Zocor again with his cardiologist tomorrow.  He has tolerated Zocor in the past.  He has been monitoring his blood sugars noting a range of 108-120.  He notes about 20 lb weight loss since the day of surgery.  He plans to start cardiac rehab and is hoping to resume driving.  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  He understands that he will not be fully cleared for full duty at least until 3 months post op.  Current Outpatient Prescriptions  Medication Sig Dispense Refill  . aspirin EC 325 MG EC tablet Take 1 tablet (325 mg total) by mouth daily. 30 tablet 0  . carvedilol (COREG) 12.5 MG tablet Take 1.5 tablets (18.75 mg total) by mouth 2 (two) times daily with a meal. 90 tablet 1  . fluticasone (FLONASE) 50 MCG/ACT nasal spray Place 1 spray into both nostrils daily as needed for allergies or rhinitis.    Marland Kitchen loratadine  (CLARITIN) 10 MG tablet Take 10 mg by mouth daily.    . metFORMIN (GLUCOPHAGE) 1000 MG tablet Take 1,000-1,500 mg by mouth 2 (two) times daily with a meal. *TAKES 1000MG  IN THE MORNING AND 1500MG  IN THE EVENING*    . multivitamin (THERAGRAN) per tablet Take 1 tablet by mouth daily.      . nitroGLYCERIN (NITROSTAT) 0.4 MG SL tablet Place 1 tablet (0.4 mg total) under the tongue every 5 (five) minutes as needed. 25 tablet 3  . atorvastatin (LIPITOR) 80 MG tablet Take 1 tablet (80 mg total) by mouth daily. (Patient not taking: Reported on 01/16/2015) 30 tablet 1  . oxyCODONE (OXY IR/ROXICODONE) 5 MG immediate release tablet Take 1-2 tablets (5-10 mg total) by mouth every 3 (three) hours as needed for severe pain. (Patient not taking: Reported on 01/16/2015) 30 tablet 0  . quinapril (ACCUPRIL) 10 MG tablet Take 1 tablet (10 mg total) by mouth daily. 30 tablet 1   No current facility-administered medications for this visit.      Physical Exam:   BP 160/109 mmHg  Pulse 86  Resp 16  Ht 5\' 11"  (1.803 m)  Wt 200 lb (90.719 kg)  BMI 27.91 kg/m2  SpO2 97%   General:  Well appearing, NAD, No increased work of breathing  Chest:   CTA bilaterally A/P  CV:   Regular rate and rhythm, no  murmur appreciated  Incisions:  Well healing    Abdomen:  Soft, NT, non-distended, +bs  Extremities:  Warm and well perfused.  No edema  Diagnostic Tests:  CHEST 2 VIEW  COMPARISON: None.  FINDINGS: Mediastinum hilar structures normal. Cardiomegaly. Prior CABG. Bibasilar subsegmental atelectasis and or scarring again noted . Findings have improved from prior exam. Small left pleural effusion cannot be excluded. No pneumothorax.  IMPRESSION: 1. Interval improvement of bibasilar subsegmental atelectasis and/or scarring.  2. Stable cardiomegaly. Prior CABG.   Electronically Signed  By: Marcello Moores Register  On: 01/16/2015 08:24    Impression:  64 yo male with known history of coronary  artery disease, hypertension, hyperlipidemia, type 2 diabetes mellitus, and remote history of tobacco use.   1 month s/p CABG x 4 for Unstable Exertional Angina and 3 vessel disease, Ischemic cardiomyopathy with moderate LV systolic dysfunction, EF 69%.   Compliant with medication, lifestyle, and recovery plans. CXR clear today.  Mild basilar atelectasis. Healing and progressing well without complication.    Plan:  Continue prescribed medications.  Patient to discuss Statin therapy with cardiology Increase activity and start cardiac rehab Cleared to drive with caution Limit upper extremity and shoulder activity for at least 3 months post op. Continue to tightly monitor and control DM Follow up in 2 months   Zachary Hockey PA-S 01/16/15 1100   I have seen and examined the patient and agree with the assessment and plan as outlined.  Zachary Ellis,Zachary Ellis 01/16/2015 11:02 AM     Zachary Gu. Roxy Manns, MD 01/16/2015 10:27 AM

## 2015-01-16 NOTE — Patient Instructions (Addendum)
The patient should continue to avoid any heavy lifting or strenuous use of arms or shoulders for at least a total of three months from the time of surgery.  The patient may return to driving an automobile as long as they are no longer requiring oral narcotic pain relievers during the daytime.  It would be wise to start driving only short distances during the daylight and gradually increase from there as they feel comfortable.  The patient is encouraged to enroll and participate in the outpatient cardiac rehab program beginning as soon as practical.  The patient is reminded to make every effort to keep their diabetes under very tight control.  They should follow up closely with their primary care physician or endocrinologist and strive to keep their hemoglobin A1c levels as low as possible, preferably near or below 6.0

## 2015-01-17 ENCOUNTER — Ambulatory Visit (INDEPENDENT_AMBULATORY_CARE_PROVIDER_SITE_OTHER): Payer: BLUE CROSS/BLUE SHIELD | Admitting: Nurse Practitioner

## 2015-01-17 ENCOUNTER — Encounter: Payer: Self-pay | Admitting: Nurse Practitioner

## 2015-01-17 VITALS — BP 142/88 | Ht 71.0 in | Wt 209.8 lb

## 2015-01-17 DIAGNOSIS — Z951 Presence of aortocoronary bypass graft: Secondary | ICD-10-CM

## 2015-01-17 LAB — CBC
HCT: 36.7 % — ABNORMAL LOW (ref 39.0–52.0)
Hemoglobin: 12.3 g/dL — ABNORMAL LOW (ref 13.0–17.0)
MCHC: 33.4 g/dL (ref 30.0–36.0)
MCV: 81.8 fl (ref 78.0–100.0)
Platelets: 289 10*3/uL (ref 150.0–400.0)
RBC: 4.49 Mil/uL (ref 4.22–5.81)
RDW: 15.2 % (ref 11.5–15.5)
WBC: 7.5 10*3/uL (ref 4.0–10.5)

## 2015-01-17 MED ORDER — QUINAPRIL HCL 20 MG PO TABS: 20.0000 mg | ORAL_TABLET | Freq: Every day | ORAL | Status: DC

## 2015-01-17 MED ORDER — ROSUVASTATIN CALCIUM 10 MG PO TABS: 10.0000 mg | ORAL_TABLET | Freq: Every day | ORAL | Status: DC

## 2015-01-17 NOTE — Progress Notes (Signed)
CARDIOLOGY OFFICE NOTE  Date:  01/17/2015    Domingo Sep Date of Birth: 1951-09-09 Medical Record #[id number]  PCP:  [name], MD  Cardiologist:  [facility]    Chief Complaint  Patient presents with  . Post -Op Open Heart Surgery    Post hospital visit - seen for Dr. Angelena Form     History of Present Illness: Zachary Ellis is a 64 y.o. male who presents today for a post hospital visit. He is seen for Dr. Angelena Form. He has known CAD with prior posterior MI and remote PCI of the LCX with prior unsuccessful PCI of the LAD back in 2005. Other issues include DM, hypothyroidism, HTN and HLD.  Presented last month with chest pain with exertion and heart failure - referred for cath and found to have severe 3VD - referred for CABG x 4 by Dr. Roxy Manns with LIMA to LAD, SVG to RCA, SVG to OM, & SVG to DX. Hospital course was uncomplicated. His EF is reduced - 35% by cath and by echo.   Comes back today. Here alone.  He is doing well. No chest pain. No soreness. Not sleeping well but mainly that's because of ringing in his ears. This was present prior to his surgery. Not short of breath. Not dizzy or lightheaded. No swelling. Weight is ok. Restricting his salt. Was not aware about his LV function.   Past Medical History  Diagnosis Date  . Coronary artery disease   . Hyperlipidemia, mixed   . Hypertension   . Hypothyroidism   . Kidney stones     "passed on my own" (12/25/2014)  . Anginal pain   . Type II diabetes mellitus   . 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 Wisdom tooth extraction  1980's    'all 4"  . Coronary angioplasty with stent placement  12/2003    CFX  . Coronary angioplasty  01/2004    failed LAD PCI  . Cardiac catheterization  2014-12-25    "recommending about OHS"  . Left heart catheterization with coronary angiogram N/A Dec 25, 2014     Procedure: LEFT HEART CATHETERIZATION WITH CORONARY ANGIOGRAM;  Surgeon: Burnell Blanks, MD;  Location[facility] C[facility] LAB;  Service: Cardiovascular;  Laterality: N/A;  . Coronary artery bypass graft N/A 12/09/2014    Procedure: CORONARY ARTERY BYPASS GRAFTING (CABG), ON PUMP, TIMES FOUR, USING LEFT INTERNAL MAMMARY ARTERY, RIGHT GREATER SAPHENOUS VEIN HARVESTED ENDOSCOPICALLY;  Surgeon: Rexene Alberts, MD;  Location: [facility];  Service: Open Heart Surgery;  Laterality: N/A;  . Intraoperative transesophageal echocardiogram N/A 12/09/2014    Procedure: INTRAOPERATIVE TRANSESOPHAGEAL ECHOCARDIOGRAM;  Surgeon: Rexene Alberts, MD;  Location: [facility];  Service: Open Heart Surgery;  Laterality: N/A;     Medications: Current Outpatient Prescriptions  Medication Sig Dispense Refill  . aspirin EC 325 MG EC tablet Take 1 tablet (325 mg total) by mouth daily. 30 tablet 0  . carvedilol (COREG) 12.5 MG tablet Take 1.5 tablets (18.75 mg total) by mouth 2 (two) times daily with a meal. 90 tablet 1  . fluticasone (FLONASE) 50 MCG/ACT nasal spray Place 1 spray into both nostrils daily as needed for allergies or rhinitis.    Marland Kitchen loratadine (CLARITIN) 10 MG tablet Take 10 mg by mouth daily.    . metFORMIN (  GLUCOPHAGE) 1000 MG tablet Take 1,000-1,500 mg by mouth 2 (two) times daily with a meal. *TAKES 1000MG  IN THE MORNING AND 1500MG  IN THE EVENING*    . multivitamin (THERAGRAN) per tablet Take 1 tablet by mouth daily.      . nitroGLYCERIN (NITROSTAT) 0.4 MG SL tablet Place 1 tablet (0.4 mg total) under the tongue every 5 (five) minutes as needed. 25 tablet 3  . oxyCODONE (OXY IR/ROXICODONE) 5 MG immediate release tablet Take 1-2 tablets (5-10 mg total) by mouth every 3 (three) hours as needed for severe pain. (Patient not taking: Reported on 01/16/2015) 30 tablet 0  . quinapril (ACCUPRIL) 20 MG tablet Take 1 tablet (20 mg total) by mouth at bedtime. 30 tablet 11  . rosuvastatin (CRESTOR) 10 MG tablet Take 1 tablet  (10 mg total) by mouth daily. 30 tablet 6   No current facility-administered medications for this visit.    Allergies: No Known Allergies  Social History: The patient  reports that he has quit smoking. His smoking use included Cigarettes. He has a 15 pack-year smoking history. He has never used smokeless tobacco. He reports that he does not drink alcohol or use illicit drugs.   Family History: The patient's family history includes Coronary artery disease (age of onset: 43) in his father.   Review of Systems: Please see the history of present illness.   Otherwise, the review of systems is positive for abdominal issues which led to stopping his statin.   All other systems are reviewed and negative.   Physical Exam: VS:  BP 142/88 mmHg  Ht 5\' 11"  (1.803 m)  Wt 209 lb 12.8 oz (95.165 kg)  BMI 29.27 kg/m2 .  BMI Body mass index is 29.27 kg/(m^2). General: Pleasant. Well developed, well nourished and in no acute distress.  HEENT: Normal. Neck: Supple, no JVD, carotid bruits, or masses noted.  Cardiac: Regular rate and rhythm. No murmurs, rubs, or gallops. No edema. Sternum looks good. Respiratory:  Lungs are clear to auscultation bilaterally with normal work of breathing.  GI: Soft and nontender.  MS: No deformity or atrophy. Gait and ROM intact. Skin: Warm and dry. Color is normal.  Neuro:  Strength and sensation are intact and no gross focal deficits noted.  Psych: Alert, appropriate and with normal affect.   Wt Readings from Last 3 Encounters:  01/17/15 209 lb 12.8 oz (95.165 kg)  01/16/15 200 lb (90.719 kg)  12/13/14 227 lb 11.2 oz (103.284 kg)    LABORATORY DATA:  EKG:  EKG is ordered today. The EKG ordered today demonstrates NSR - unchanged from past tracings.   Lab Results  Component Value Date   WBC 10.0 12/12/2014   HGB 10.3* 12/12/2014   HCT 31.4* 12/12/2014   PLT 179 12/12/2014   GLUCOSE 127* 12/12/2014   CHOL 156 12/08/2014   TRIG 151* 12/08/2014   HDL 31*  12/08/2014   LDLCALC 95 12/08/2014   ALT 47 12/08/2014   AST 28 12/08/2014   NA 140 12/12/2014   K 4.0 12/12/2014   CL 102 12/12/2014   CREATININE 0.68 12/12/2014   BUN 17 12/12/2014   CO2 25 12/12/2014   TSH 0.459 12/08/2014   INR 1.26 12/09/2014   HGBA1C 7.1* 12/08/2014    BNP (last 3 results)  Recent Labs  12/08/14 0346  PROBNP 167.1*    Other Studies Reviewed Today:  TEE Echo Study Conclusions from 11/2015  - Left ventricle: The cavity size was mildly dilated. Systolic function was moderately  reduced. Moderate hypokinesis of the lateral, inferolateral, and inferior myocardium.   TTE Echo Study Conclusions  - Left ventricle: The cavity size was normal. Wall thickness was increased in a pattern of mild LVH. Systolic function was moderately reduced. The estimated ejection fraction was in the range of 35% to 40%. There is inferolateral hypokinesis. Doppler parameters are consistent with abnormal left ventricular relaxation (grade 1 diastolic dysfunction). The E/e&' ratio is <8, suggesting normal LV filling pressure. - Aorta: The sinotubular junction is mildly dilated at 4.1 cm. The aortic root measures within normal limits. - Left atrium: The atrium was normal in size.  Impressions:  - LVEF 35-40%, inferior and lateral hypokinesis, diastolic dysfunction, normal LV filling pressure, mildly dilated STJ at 4.1 cm.   Assessment/Plan: 1. CAD - s/p CABG x 4 -with reduction in his LV function by echo as well as by cath - he is doing well - ok to start cardiac rehab.   2. HTN - BP is up - ACE is increased today  3. HLD -  Not tolerating Lipitor - already stopped - will try Crestor 10 mg - samples and coupon card given to him today  4. DM  5. Blood loss anemia  6. LV dysfunction/systolic HF - increasing ACE. Hope to continue to titrate as BP and HR permits.   Recheck baseline labs today. Refer to cardiac rehab. Titrate medicines over the  next 3 months with plans for repeat echo/possible ICD in mid March.   Current medicines are reviewed with the patient today.  The patient has no other concerns regarding medicines.  The following changes have been made:    Labs/ tests ordered today include:    Orders Placed This Encounter  Procedures  . Basic metabolic panel  . CBC  . EKG 12-Lead     Disposition:   FU with me in 3 weeks  Patient is agreeable to this plan and will call if any problems develop in the interim.   Signed: Burtis Junes, RN, ANP-C 01/17/2015 8:51 AM  [facility] [address] [place], [place]  [address] Phone: [phone] Fax: [phone]

## 2015-01-17 NOTE — Patient Instructions (Addendum)
We will be checking the following labs today BMET and CBC  Stay on your current medicines but I am going to increase the Quinapril to 20 mg a day - take 2 of your 10 mg tablets to use up - the 20 mg is at the drug store  See me in 3 weeks  We will try to add Crestor 10 mg a day - samples if possible and coupon card   I will send a note to cardiac rehab.  Call the [place] office at [phone] if you have any questions, problems or concerns.

## 2015-01-18 LAB — BASIC METABOLIC PANEL
BUN: 14 mg/dL (ref 6–23)
CO2: 25 mEq/L (ref 19–32)
Calcium: 9.7 mg/dL (ref 8.4–10.5)
Chloride: 104 mEq/L (ref 96–112)
Creatinine, Ser: 0.89 mg/dL (ref 0.40–1.50)
GFR: 92.33 mL/min (ref >60.00)
Glucose, Bld: 135 mg/dL — ABNORMAL HIGH (ref 70–99)
Potassium: 4.4 mEq/L (ref 3.5–5.1)
Sodium: 139 mEq/L (ref 135–145)

## 2015-01-26 ENCOUNTER — Encounter (HOSPITAL_COMMUNITY)
Admission: RE | Admit: 2015-01-26 | Discharge: 2015-01-26 | Disposition: A | Discharge status: Home / Self Care | Payer: BLUE CROSS/BLUE SHIELD | Source: Ambulatory Visit | Attending: Cardiovascular Disease | Admitting: Cardiovascular Disease

## 2015-01-26 DIAGNOSIS — I2510 Atherosclerotic heart disease of native coronary artery without angina pectoris: Secondary | ICD-10-CM | POA: Insufficient documentation

## 2015-01-26 DIAGNOSIS — Z48812 Encounter for surgical aftercare following surgery on the circulatory system: Secondary | ICD-10-CM | POA: Insufficient documentation

## 2015-01-26 DIAGNOSIS — Z951 Presence of aortocoronary bypass graft: Secondary | ICD-10-CM | POA: Insufficient documentation

## 2015-01-26 NOTE — Progress Notes (Signed)
Cardiac Rehab Medication Review by a Pharmacist  Does the patient  feel that his/her medications are working for him/her?  yes  Has the patient been experiencing any side effects to the medications prescribed?  no  Does the patient measure his/her own blood pressure or blood glucose at home?  yes - BG and BP every other day  Does the patient have any problems obtaining medications due to transportation or finances?   Yes - crestor 1 month supply $200, will be talking to MD about switching  Understanding of regimen: excellent Understanding of indications: excellent Potential of compliance: excellent    Pharmacist comments:  Pleasant 64 yo M presenting to cardiac rehab in good spirits. All medications were reviewed and all questions were answered. Patient explained that he went to pick up his crestor and it was $200. There were some issues with his insurance for a little bit so he is going to follow up with them and see what the copay should be.   Thank you for allowing pharmacy to be part of this patient's care team  Haiden Clucas M. Danya Spearman, Pharm.D Clinical Pharmacy Resident Pager: [phone] 01/26/2015 .9:25 AM

## 2015-02-01 ENCOUNTER — Encounter (HOSPITAL_COMMUNITY)
Admission: RE | Admit: 2015-02-01 | Discharge: 2015-02-01 | Disposition: A | Discharge status: Home / Self Care | Payer: BLUE CROSS/BLUE SHIELD | Source: Ambulatory Visit | Attending: Cardiovascular Disease | Admitting: Cardiovascular Disease

## 2015-02-01 ENCOUNTER — Encounter (HOSPITAL_COMMUNITY): Payer: Self-pay

## 2015-02-01 DIAGNOSIS — Z951 Presence of aortocoronary bypass graft: Secondary | ICD-10-CM | POA: Diagnosis not present

## 2015-02-01 DIAGNOSIS — Z48812 Encounter for surgical aftercare following surgery on the circulatory system: Secondary | ICD-10-CM | POA: Diagnosis not present

## 2015-02-01 DIAGNOSIS — I2510 Atherosclerotic heart disease of native coronary artery without angina pectoris: Secondary | ICD-10-CM | POA: Diagnosis not present

## 2015-02-01 LAB — GLUCOSE, CAPILLARY
GLUCOSE-CAPILLARY: 154 mg/dL — AB (ref 70–99)
GLUCOSE-CAPILLARY: 107 mg/dL — AB (ref 70–99)

## 2015-02-01 NOTE — Progress Notes (Addendum)
Pt started cardiac rehab today.  Pt tolerated light exercise without difficulty.  BP:  152/96 pre-exercise, peak 160/100 with exercise, 144/100 post.   telemetry-NSR.  Asymptomatic.  Dr. Angelena Form informed of pt BP.  PHQ-0.  Pt enjoys playing golf, and watching son play softball.  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  Pt oriented to exercise equipment and routine.  Understanding verbalized.

## 2015-02-03 ENCOUNTER — Telehealth (HOSPITAL_COMMUNITY): Payer: Self-pay | Admitting: Cardiac Rehabilitation

## 2015-02-03 ENCOUNTER — Encounter (HOSPITAL_COMMUNITY)
Admission: RE | Admit: 2015-02-03 | Discharge: 2015-02-03 | Disposition: A | Discharge status: Home / Self Care | Payer: BLUE CROSS/BLUE SHIELD | Source: Ambulatory Visit | Attending: Cardiovascular Disease | Admitting: Cardiovascular Disease

## 2015-02-03 DIAGNOSIS — Z48812 Encounter for surgical aftercare following surgery on the circulatory system: Secondary | ICD-10-CM | POA: Diagnosis not present

## 2015-02-03 LAB — GLUCOSE, CAPILLARY: GLUCOSE-CAPILLARY: 86 mg/dL (ref 70–99)

## 2015-02-03 MED ORDER — CARVEDILOL 25 MG PO TABS: 25.0000 mg | ORAL_TABLET | Freq: Two times a day (BID) | ORAL | Status: DC

## 2015-02-03 NOTE — Telephone Encounter (Signed)
Spoke with pt and gave him instructions to increase Coreg to 25 mg by mouth twice daily. Will send prescription to [facility] in [place].

## 2015-02-03 NOTE — Telephone Encounter (Signed)
-----   Message from Burnell Blanks, MD sent at 02/02/2015  3:23 PM EST ----- Regarding: RE: cardiac rehab Pat, can we contact him and increase his Coreg to 25 mg po BID for better BP control. Thanks, chris  ----- Message -----    From: Lowell Guitar, RN    Sent: 02/01/2015   2:45 PM      To: Burnell Blanks, MD Subject: cardiac rehab                                  Dear Dr. Angelena Form,  Today was first day of cardiac rehab.  Pt looking forward to increasing his strength and stamina.    Pt BP mildly elevated at cardiac rehab today.  BP:  152/96 pre exercise, 160/100 peak exercise and 144/100 post.  Pt asymptomatic. Pt denies missed medication or change in diet/routine.  Pt currently taking coreg 18.75mg  BID and quinapril 20mg  daily.    Please advise.  Thank you, Andi Hence, RN, BSN Cardiac Pulmonary Rehab

## 2015-02-03 NOTE — Telephone Encounter (Signed)
I placed call to pt to give him these instructions. Left message to call back

## 2015-02-03 NOTE — Addendum Note (Signed)
Addended by: Thompson Grayer on: 02/03/2015 10:02 AM   Modules accepted: Orders

## 2015-02-06 ENCOUNTER — Encounter (HOSPITAL_COMMUNITY): Payer: BLUE CROSS/BLUE SHIELD

## 2015-02-06 LAB — GLUCOSE, CAPILLARY: GLUCOSE-CAPILLARY: 174 mg/dL — AB (ref 70–99)

## 2015-02-08 ENCOUNTER — Encounter (HOSPITAL_COMMUNITY)
Admission: RE | Admit: 2015-02-08 | Discharge: 2015-02-08 | Disposition: A | Discharge status: Home / Self Care | Payer: BLUE CROSS/BLUE SHIELD | Source: Ambulatory Visit | Attending: Cardiovascular Disease | Admitting: Cardiovascular Disease

## 2015-02-08 DIAGNOSIS — Z48812 Encounter for surgical aftercare following surgery on the circulatory system: Secondary | ICD-10-CM | POA: Diagnosis not present

## 2015-02-10 ENCOUNTER — Encounter: Payer: Self-pay | Admitting: Nurse Practitioner

## 2015-02-10 ENCOUNTER — Encounter (HOSPITAL_COMMUNITY): Payer: BLUE CROSS/BLUE SHIELD

## 2015-02-10 ENCOUNTER — Ambulatory Visit (INDEPENDENT_AMBULATORY_CARE_PROVIDER_SITE_OTHER): Payer: BLUE CROSS/BLUE SHIELD | Admitting: Nurse Practitioner

## 2015-02-10 VITALS — BP 138/92 | HR 85 | Ht 70.0 in | Wt 208.0 lb

## 2015-02-10 DIAGNOSIS — E782 Mixed hyperlipidemia: Secondary | ICD-10-CM

## 2015-02-10 DIAGNOSIS — I255 Ischemic cardiomyopathy: Secondary | ICD-10-CM

## 2015-02-10 DIAGNOSIS — Z951 Presence of aortocoronary bypass graft: Secondary | ICD-10-CM

## 2015-02-10 DIAGNOSIS — I10 Essential (primary) hypertension: Secondary | ICD-10-CM

## 2015-02-10 LAB — LIPID PANEL
Cholesterol: 115 mg/dL (ref 0–200)
HDL: 26.9 mg/dL — ABNORMAL LOW (ref >39.00)
LDL Cholesterol: 64 mg/dL (ref 0–99)
NonHDL: 88.1
Total CHOL/HDL Ratio: 4
Triglycerides: 123 mg/dL (ref 0.0–149.0)
VLDL: 24.6 mg/dL (ref 0.0–40.0)

## 2015-02-10 LAB — BASIC METABOLIC PANEL
BUN: 13 mg/dL (ref 6–23)
CO2: 28 mEq/L (ref 19–32)
Calcium: 9.8 mg/dL (ref 8.4–10.5)
Chloride: 103 mEq/L (ref 96–112)
Creatinine, Ser: 1.02 mg/dL (ref 0.40–1.50)
GFR: 78.87 mL/min (ref >60.00)
Glucose, Bld: 130 mg/dL — ABNORMAL HIGH (ref 70–99)
Potassium: 4.1 mEq/L (ref 3.5–5.1)
Sodium: 138 mEq/L (ref 135–145)

## 2015-02-10 LAB — HEPATIC FUNCTION PANEL
ALT: 17 U/L (ref 0–53)
AST: 16 U/L (ref 0–37)
Albumin: 4.3 g/dL (ref 3.5–5.2)
Alkaline Phosphatase: 82 U/L (ref 39–117)
Bilirubin, Direct: 0.1 mg/dL (ref 0.0–0.3)
Total Bilirubin: 0.6 mg/dL (ref 0.2–1.2)
Total Protein: 7.6 g/dL (ref 6.0–8.3)

## 2015-02-10 MED ORDER — QUINAPRIL HCL 40 MG PO TABS: 40.0000 mg | ORAL_TABLET | Freq: Every day | ORAL | Status: DC

## 2015-02-10 NOTE — Progress Notes (Signed)
CARDIOLOGY OFFICE NOTE  Date:  02/10/2015    Zachary Ellis Date of Birth: Jun 17, 1951 Medical Record #[id number]  PCP:  [name], MD  Cardiologist:  [facility]  Chief Complaint  Patient presents with  . Coronary Artery Disease    Follow up visit - seen for Dr. Angelena Form    History of Present Illness: Zachary Ellis is a 64 y.o. male who presents today for a follow up visit today. He is seen for Dr. Angelena Form. He has known CAD with prior posterior MI and remote PCI of the LCX with prior unsuccessful PCI of the LAD back in 2005. Other issues include DM, hypothyroidism, HTN and HLD.  Presented back in December with chest pain with exertion and heart failure - referred for cath and found to have severe 3VD - referred for CABG x 4 by Dr. Roxy Manns with LIMA to LAD, SVG to RCA, SVG to OM, & SVG to DX. Hospital course was uncomplicated. His EF is reduced - 35% by cath and by echo.   I saw him for his post hospital visit. He was doing well. Was not aware about his LV function. Explained need to titrate meds over course of 3 months with plans to repeat his echo. ACE increased at that visit.  Comes back today. Here alone. Doing ok. Enrolled in cardiac rehab. BP still up some. He feels good. No chest pain. Not short of breath. Weight is stable. No swelling. Not dizzy or lightheaded. Feels ok on his medicines. Has had some trouble with using the $3 coupon card for his Crestor. Has had a UTI and is on antibiotic.    Past Medical History  Diagnosis Date  . Coronary artery disease   . Hyperlipidemia, mixed   . Hypertension   . Hypothyroidism   . Kidney stones     "passed on my own" (December 25, 2014)  . Anginal pain   . Type II diabetes mellitus   . 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 Wisdom tooth extraction  1980's    'all 4"  . Coronary angioplasty with  stent placement  12/2003    CFX  . Coronary angioplasty  01/2004    failed LAD PCI  . Cardiac catheterization  2014-12-25    "recommending about OHS"  . Left heart catheterization with coronary angiogram N/A 12/25/2014    Procedure: LEFT HEART CATHETERIZATION WITH CORONARY ANGIOGRAM;  Surgeon: Burnell Blanks, MD;  Location: [facility] CATH LAB;  Service: Cardiovascular;  Laterality: N/A;  . Coronary artery bypass graft N/A 12/09/2014    Procedure: CORONARY ARTERY BYPASS GRAFTING (CABG), ON PUMP, TIMES FOUR, USING LEFT INTERNAL MAMMARY ARTERY, RIGHT GREATER SAPHENOUS VEIN HARVESTED ENDOSCOPICALLY;  Surgeon: Rexene Alberts, MD;  Location: [place];  Service: Open Heart Surgery;  Laterality: N/A;  . Intraoperative transesophageal echocardiogram N/A 12/09/2014    Procedure: INTRAOPERATIVE TRANSESOPHAGEAL ECHOCARDIOGRAM;  Surgeon: Rexene Alberts, MD;  Location: [place];  Service: Open Heart Surgery;  Laterality: N/A;     Medications: Current Outpatient Prescriptions  Medication Sig Dispense Refill  . aspirin EC 325 MG EC tablet Take 1 tablet (325 mg total) by mouth daily. 30 tablet 0  . carvedilol (COREG) 25 MG tablet Take 1 tablet (25 mg total) by mouth 2 (two) times daily with a meal. 60 tablet  11  . fluticasone (FLONASE) 50 MCG/ACT nasal spray Place 1 spray into both nostrils daily as needed for allergies or rhinitis.    Marland Kitchen loratadine (CLARITIN) 10 MG tablet Take 10 mg by mouth daily.    . metFORMIN (GLUCOPHAGE) 1000 MG tablet Take 1,000-1,500 mg by mouth 2 (two) times daily with a meal. *TAKES 1000MG  IN THE MORNING AND 1500MG  IN THE EVENING*    . multivitamin (THERAGRAN) per tablet Take 1 tablet by mouth daily.      . nitroGLYCERIN (NITROSTAT) 0.4 MG SL tablet Place 1 tablet (0.4 mg total) under the tongue every 5 (five) minutes as needed. 25 tablet 3  . oxyCODONE (OXY IR/ROXICODONE) 5 MG immediate release tablet Take 1-2 tablets (5-10 mg total) by mouth every 3 (three) hours as needed for severe  pain. 30 tablet 0  . rosuvastatin (CRESTOR) 10 MG tablet Take 1 tablet (10 mg total) by mouth daily. 30 tablet 6  . quinapril (ACCUPRIL) 40 MG tablet Take 1 tablet (40 mg total) by mouth at bedtime. 90 tablet 3   No current facility-administered medications for this visit.    Allergies: No Known Allergies  Social History: The patient  reports that he has quit smoking. His smoking use included Cigarettes. He has a 15 pack-year smoking history. He has never used smokeless tobacco. He reports that he does not drink alcohol or use illicit drugs.   Family History: The patient's family history includes Coronary artery disease (age of onset: 58) in his father.   Review of Systems: Please see the history of present illness.   Otherwise, the review of systems is positive for hematuria/UTI.   All other systems are reviewed and negative.   Physical Exam: VS:  BP 138/92 mmHg  Pulse 85  Ht 5\' 10"  (1.778 m)  Wt 208 lb (94.348 kg)  BMI 29.84 kg/m2 .  BMI Body mass index is 29.84 kg/(m^2).  Wt Readings from Last 3 Encounters:  02/10/15 208 lb (94.348 kg)  01/26/15 208 lb 15.9 oz (94.8 kg)  01/17/15 209 lb 12.8 oz (95.165 kg)    General: Pleasant. Well developed, well nourished and in no acute distress. Weight is stable.  HEENT: Normal. Neck: Supple, no JVD, carotid bruits, or masses noted.  Cardiac: Regular rate and rhythm. No murmurs, rubs, or gallops. No edema.  Respiratory:  Lungs are clear to auscultation bilaterally with normal work of breathing.  GI: Soft and nontender.  MS: No deformity or atrophy. Gait and ROM intact. Skin: Warm and dry. Color is normal.  Neuro:  Strength and sensation are intact and no gross focal deficits noted.  Psych: Alert, appropriate and with normal affect.   LABORATORY DATA:  EKG:  EKG is not ordered today.   Lab Results  Component Value Date   WBC 7.5 01/17/2015   HGB 12.3* 01/17/2015   HCT 36.7* 01/17/2015   PLT 289.0 01/17/2015   GLUCOSE 135*  01/17/2015   CHOL 156 12/08/2014   TRIG 151* 12/08/2014   HDL 31* 12/08/2014   LDLCALC 95 12/08/2014   ALT 47 12/08/2014   AST 28 12/08/2014   NA 139 01/17/2015   K 4.4 01/17/2015   CL 104 01/17/2015   CREATININE 0.89 01/17/2015   BUN 14 01/17/2015   CO2 25 01/17/2015   TSH 0.459 12/08/2014   INR 1.26 12/09/2014   HGBA1C 7.1* 12/08/2014    BNP (last 3 results) No results for input(s): BNP in the last 8760 hours.  ProBNP (last 3 results)  Recent Labs  12/08/14 0346  PROBNP 167.1*     Other Studies Reviewed Today:  TEE Echo Study Conclusions from 11/2015  - Left ventricle: The cavity size was mildly dilated. Systolic function was moderately reduced. Moderate hypokinesis of the lateral, inferolateral, and inferior myocardium.   TTE Echo Study Conclusions  - Left ventricle: The cavity size was normal. Wall thickness was increased in a pattern of mild LVH. Systolic function was moderately reduced. The estimated ejection fraction was in the range of 35% to 40%. There is inferolateral hypokinesis. Doppler parameters are consistent with abnormal left ventricular relaxation (grade 1 diastolic dysfunction). The E/e&' ratio is <8, suggesting normal LV filling pressure. - Aorta: The sinotubular junction is mildly dilated at 4.1 cm. The aortic root measures within normal limits. - Left atrium: The atrium was normal in size.  Impressions:  - LVEF 35-40%, inferior and lateral hypokinesis, diastolic dysfunction, normal LV filling pressure, mildly dilated STJ at 4.1 cm.   Assessment/Plan: 1. CAD - s/p CABG x 4 -with reduction in his LV function by echo as well as by cath - he is doing well - ACE is increased today.   2. HTN - ACE increased at last visit - still with some elevated readings - will increase him to Accupril 40 mg a day  3. HLD - did not tolerating Lipitor - now on Crestor - will give samples. Recheck labs today.   4. DM  5. Blood  loss anemia -  Resolved.   6. LV dysfunction/systolic HF - Hope to continue to titrate as BP and HR permits with plans for repeat echo after mid March. His ACE is increased today. Echo after March 18 with follow up with Dr. Angelena Form for further discussion.   Current medicines are reviewed with the patient today.  The patient does not have concerns regarding medicines other than what has been noted above.  The following changes have been made:  See above.  Labs/ tests ordered today include:    Orders Placed This Encounter  Procedures  . Basic metabolic panel  . Hepatic function panel  . Lipid panel  . 2D Echocardiogram without contrast     Disposition:   FU with Dr. Angelena Form a few days after his echo.    Patient is agreeable to this plan and will call if any problems develop in the interim.   Signed: Burtis Junes, RN, ANP-C 02/10/2015 8:15 AM  [facility] [address] [place], [place]  [address] Phone: [phone] Fax: [phone]

## 2015-02-10 NOTE — Patient Instructions (Addendum)
We will be checking the following labs today BMET, lipids, HPF  We will get an echocardiogram after March 18th  Stay on your current medicines but I am increasing the Accupril to 40 mg a day. You can take 2 of your 20 mg tablets and use those up and the RX for the 40 mg is at your drugstore.  Continue with cardiac rehab  See Dr. Angelena Form a few days after the echo  Call the [place] office at [phone] if you have any questions, problems or concerns.

## 2015-02-13 ENCOUNTER — Encounter (HOSPITAL_COMMUNITY)
Admission: RE | Admit: 2015-02-13 | Discharge: 2015-02-13 | Disposition: A | Discharge status: Home / Self Care | Payer: BLUE CROSS/BLUE SHIELD | Source: Ambulatory Visit | Attending: Cardiovascular Disease | Admitting: Cardiovascular Disease

## 2015-02-13 DIAGNOSIS — Z48812 Encounter for surgical aftercare following surgery on the circulatory system: Secondary | ICD-10-CM | POA: Diagnosis not present

## 2015-02-15 ENCOUNTER — Encounter (HOSPITAL_COMMUNITY)
Admission: RE | Admit: 2015-02-15 | Discharge: 2015-02-15 | Disposition: A | Discharge status: Home / Self Care | Payer: BLUE CROSS/BLUE SHIELD | Source: Ambulatory Visit | Attending: Cardiovascular Disease | Admitting: Cardiovascular Disease

## 2015-02-15 DIAGNOSIS — Z48812 Encounter for surgical aftercare following surgery on the circulatory system: Secondary | ICD-10-CM | POA: Diagnosis not present

## 2015-02-15 NOTE — Progress Notes (Signed)
PSYCHOSOCIAL ASSESSMENT  Pt psychosocial assessment reveals no barriers to rehab participation.  Pt quality of life is slightly altered by his physical constraints which limits his ability to perform tasks as prior to his illness. Specifically pt is anxious to return to work.  Pt exhibits positive coping skills and has supportive family.  Offered emotional support and reassurance.  Will continue to monitor.

## 2015-02-17 ENCOUNTER — Encounter (HOSPITAL_COMMUNITY)
Admission: RE | Admit: 2015-02-17 | Discharge: 2015-02-17 | Disposition: A | Discharge status: Home / Self Care | Payer: BLUE CROSS/BLUE SHIELD | Source: Ambulatory Visit | Attending: Cardiovascular Disease | Admitting: Cardiovascular Disease

## 2015-02-17 DIAGNOSIS — Z48812 Encounter for surgical aftercare following surgery on the circulatory system: Secondary | ICD-10-CM | POA: Diagnosis not present

## 2015-02-17 NOTE — Progress Notes (Signed)
Pt with elevation in bp today with exercise.  Pt with recent change to his bp medication.  Quinapril was increased to 40 mg last week.  Pt took 80 mg of Quinapril prior to his cardiac event.  Pt with appt for follow up with Dr. Angelena Form on 4/1. Will fax bp readings to MD for review.  Will continue to monitor his bp.

## 2015-02-20 ENCOUNTER — Encounter (HOSPITAL_COMMUNITY)
Admission: RE | Admit: 2015-02-20 | Discharge: 2015-02-20 | Disposition: A | Discharge status: Home / Self Care | Payer: BLUE CROSS/BLUE SHIELD | Source: Ambulatory Visit | Attending: Cardiovascular Disease | Admitting: Cardiovascular Disease

## 2015-02-20 DIAGNOSIS — Z48812 Encounter for surgical aftercare following surgery on the circulatory system: Secondary | ICD-10-CM | POA: Diagnosis not present

## 2015-02-22 ENCOUNTER — Encounter (HOSPITAL_COMMUNITY)
Admission: RE | Admit: 2015-02-22 | Discharge: 2015-02-22 | Disposition: A | Discharge status: Home / Self Care | Payer: BLUE CROSS/BLUE SHIELD | Source: Ambulatory Visit | Attending: Cardiovascular Disease | Admitting: Cardiovascular Disease

## 2015-02-22 DIAGNOSIS — Z951 Presence of aortocoronary bypass graft: Secondary | ICD-10-CM | POA: Insufficient documentation

## 2015-02-22 DIAGNOSIS — Z48812 Encounter for surgical aftercare following surgery on the circulatory system: Secondary | ICD-10-CM | POA: Insufficient documentation

## 2015-02-22 DIAGNOSIS — I2510 Atherosclerotic heart disease of native coronary artery without angina pectoris: Secondary | ICD-10-CM | POA: Insufficient documentation

## 2015-02-22 NOTE — Progress Notes (Signed)
Reviewed home exercise guidelines with patient including endpoints, temperature precautions, target heart rate and rate of perceived exertion. Pt plans to walk and is using stationary bike at home as his mode of home exercise. Currently patient exercising 20 minutes 2 other days per week. Encouraged pt to increase aerobic exercise duration to 30 minutes per day, and pt is amenable to this. Pt voices understanding of instructions given.

## 2015-02-24 ENCOUNTER — Ambulatory Visit (HOSPITAL_COMMUNITY): Payer: BLUE CROSS/BLUE SHIELD | Attending: Cardiology

## 2015-02-24 ENCOUNTER — Encounter (HOSPITAL_COMMUNITY)
Admission: RE | Admit: 2015-02-24 | Discharge: 2015-02-24 | Disposition: A | Discharge status: Home / Self Care | Payer: BLUE CROSS/BLUE SHIELD | Source: Ambulatory Visit | Attending: Cardiovascular Disease | Admitting: Cardiovascular Disease

## 2015-02-24 ENCOUNTER — Telehealth (HOSPITAL_COMMUNITY): Payer: Self-pay | Admitting: Cardiac Rehabilitation

## 2015-02-24 DIAGNOSIS — I10 Essential (primary) hypertension: Secondary | ICD-10-CM | POA: Insufficient documentation

## 2015-02-24 DIAGNOSIS — Z87891 Personal history of nicotine dependence: Secondary | ICD-10-CM | POA: Insufficient documentation

## 2015-02-24 DIAGNOSIS — Z48812 Encounter for surgical aftercare following surgery on the circulatory system: Secondary | ICD-10-CM | POA: Diagnosis not present

## 2015-02-24 DIAGNOSIS — E782 Mixed hyperlipidemia: Secondary | ICD-10-CM

## 2015-02-24 DIAGNOSIS — E785 Hyperlipidemia, unspecified: Secondary | ICD-10-CM | POA: Diagnosis not present

## 2015-02-24 DIAGNOSIS — I255 Ischemic cardiomyopathy: Secondary | ICD-10-CM | POA: Diagnosis present

## 2015-02-24 DIAGNOSIS — E119 Type 2 diabetes mellitus without complications: Secondary | ICD-10-CM | POA: Insufficient documentation

## 2015-02-24 DIAGNOSIS — Z951 Presence of aortocoronary bypass graft: Secondary | ICD-10-CM

## 2015-02-24 DIAGNOSIS — E039 Hypothyroidism, unspecified: Secondary | ICD-10-CM | POA: Insufficient documentation

## 2015-02-24 NOTE — Progress Notes (Signed)
2D Echo completed. 02/24/2015

## 2015-02-24 NOTE — Telephone Encounter (Signed)
Pt informed.  Pt instructed to request RX for quinapril 80mg  to be sent to his pharmacy.  Understanding verbalized

## 2015-02-24 NOTE — Telephone Encounter (Signed)
-----   Message from Burnell Blanks, MD sent at 02/24/2015 12:10 PM EST ----- Regarding: RE: cardiac rehab [place], Thanks. We will contact him. I will have Fraser Din ask him to increase his Quinapril back to 80 mg per day.   Gerald Stabs  ----- Message -----    From: Lowell Guitar, RN    Sent: 02/24/2015   9:03 AM      To: Burnell Blanks, MD Subject: cardiac rehab                                  Dear Dr. Angelena Form,  For the past week pt diastolic has been running 100-110 at cardiac rehab.  It does gradually come down to 90s with exercise and 80s post exercise(systolic [id number]).    Pt is taking quinapril 40mg  x2 weeks.  Pt reports prior to his cardiac event he took quinapril 80mg  daily.    Please advise.    Thank you, Andi Hence, RN, BSN Cardiac Pulmonary Rehab

## 2015-02-27 ENCOUNTER — Encounter (HOSPITAL_COMMUNITY)
Admission: RE | Admit: 2015-02-27 | Discharge: 2015-02-27 | Disposition: A | Discharge status: Home / Self Care | Payer: BLUE CROSS/BLUE SHIELD | Source: Ambulatory Visit | Attending: Cardiovascular Disease | Admitting: Cardiovascular Disease

## 2015-02-27 DIAGNOSIS — Z48812 Encounter for surgical aftercare following surgery on the circulatory system: Secondary | ICD-10-CM | POA: Diagnosis not present

## 2015-02-27 MED ORDER — QUINAPRIL HCL 40 MG PO TABS: 80.0000 mg | ORAL_TABLET | Freq: Every day | ORAL | Status: DC

## 2015-02-27 NOTE — Addendum Note (Signed)
Addended by: Thompson Grayer on: 02/27/2015 12:21 PM   Modules accepted: Orders

## 2015-02-27 NOTE — Telephone Encounter (Signed)
Prescription sent to [facility] in [place] ?

## 2015-03-01 ENCOUNTER — Encounter (HOSPITAL_COMMUNITY)
Admission: RE | Admit: 2015-03-01 | Discharge: 2015-03-01 | Disposition: A | Discharge status: Home / Self Care | Payer: BLUE CROSS/BLUE SHIELD | Source: Ambulatory Visit | Attending: Cardiovascular Disease | Admitting: Cardiovascular Disease

## 2015-03-01 DIAGNOSIS — Z48812 Encounter for surgical aftercare following surgery on the circulatory system: Secondary | ICD-10-CM | POA: Diagnosis not present

## 2015-03-03 ENCOUNTER — Encounter (HOSPITAL_COMMUNITY)
Admission: RE | Admit: 2015-03-03 | Discharge: 2015-03-03 | Disposition: A | Discharge status: Home / Self Care | Payer: BLUE CROSS/BLUE SHIELD | Source: Ambulatory Visit | Attending: Cardiovascular Disease | Admitting: Cardiovascular Disease

## 2015-03-03 ENCOUNTER — Encounter: Payer: Self-pay | Admitting: Cardiovascular Disease

## 2015-03-03 ENCOUNTER — Ambulatory Visit (INDEPENDENT_AMBULATORY_CARE_PROVIDER_SITE_OTHER): Payer: BLUE CROSS/BLUE SHIELD | Admitting: Cardiovascular Disease

## 2015-03-03 VITALS — BP 138/98 | HR 86 | Ht 70.0 in | Wt 208.0 lb

## 2015-03-03 DIAGNOSIS — I255 Ischemic cardiomyopathy: Secondary | ICD-10-CM

## 2015-03-03 DIAGNOSIS — I10 Essential (primary) hypertension: Secondary | ICD-10-CM

## 2015-03-03 DIAGNOSIS — I2510 Atherosclerotic heart disease of native coronary artery without angina pectoris: Secondary | ICD-10-CM

## 2015-03-03 DIAGNOSIS — Z48812 Encounter for surgical aftercare following surgery on the circulatory system: Secondary | ICD-10-CM | POA: Diagnosis not present

## 2015-03-03 MED ORDER — SPIRONOLACTONE 25 MG PO TABS: 12.5000 mg | ORAL_TABLET | Freq: Every day | ORAL | Status: DC

## 2015-03-03 NOTE — Patient Instructions (Signed)
Your physician wants you to follow-up in:  3 months.  You will receive a reminder letter in the mail two months in advance. If you don't receive a letter, please call our office to schedule the follow-up appointment.  Your physician recommends that you return for lab work in: 7 days--BMP.  The lab opens at 7:30 AM   You have been referred to  Dr. Rayann Heman or Dr. Lovena Le.  Please schedule patient for new patient appointment.    Your physician has recommended you make the following change in your medication:  Start aldactone 12.5 mg by mouth daily.

## 2015-03-03 NOTE — Progress Notes (Signed)
CC: Follow up CAD and ischemic cardiomyopathy   History of Present Illness: 64 yo WM with history of CAD s/p CABG, HTN, HLD, DM here today for cardiac follow up. He has been followed in the past by Dr. Olevia Perches. 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 There was an unsuccessful attempt at PCI of a chronically occluded LAD. He was admitted to [facility] December 2015 with unstable angina and CHF. Cardiac cath demonstrated severe three vessel CAD. He underwent 4V CABG per Dr. Roxy Manns ( LIMA to LAD, SVG to RCA, SVG to OM, & SVG to Diagonal). LVEF noted to be 35% by cath.  Echo 02/24/15 with LVEF=20-25%.   He is here today for follow up. He denies chest pain, SOB or palpitations.  Still working full time without limitations. We had discussed an exercise stress test at the last visit but he cancelled.   Primary Care Physician: Claris Gower  Last Lipid Profile: Followed in primary care  Past Medical History  Diagnosis Date  . Coronary artery disease   . Hyperlipidemia, mixed   . Hypertension   . Hypothyroidism   . Kidney stones     "passed on my own" (12-31-2014)  . Anginal pain   . Type II diabetes mellitus   . 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 Wisdom tooth extraction  1980's    'all 4"  . Coronary angioplasty with stent placement  12/2003    CFX  . Coronary angioplasty  01/2004    failed LAD PCI  . Cardiac catheterization  2014-12-31    "recommending about OHS"  . Left heart catheterization with coronary angiogram N/A December 31, 2014    Procedure: LEFT HEART CATHETERIZATION WITH CORONARY ANGIOGRAM;  Surgeon: Burnell Blanks, MD;  Location: [facility] CATH LAB;  Service: Cardiovascular;  Laterality: N/A;  . Coronary artery bypass graft N/A 12/09/2014    Procedure: CORONARY ARTERY BYPASS GRAFTING (CABG), ON PUMP, TIMES FOUR,  USING LEFT INTERNAL MAMMARY ARTERY, RIGHT GREATER SAPHENOUS VEIN HARVESTED ENDOSCOPICALLY;  Surgeon: Rexene Alberts, MD;  Location: [place];  Service: Open Heart Surgery;  Laterality: N/A;  . Intraoperative transesophageal echocardiogram N/A 12/09/2014    Procedure: INTRAOPERATIVE TRANSESOPHAGEAL ECHOCARDIOGRAM;  Surgeon: Rexene Alberts, MD;  Location: [place];  Service: Open Heart Surgery;  Laterality: N/A;    Current Outpatient Prescriptions  Medication Sig Dispense Refill  . aspirin EC 325 MG EC tablet Take 1 tablet (325 mg total) by mouth daily. 30 tablet 0  . carvedilol (COREG) 25 MG tablet Take 1 tablet (25 mg total) by mouth 2 (two) times daily with a meal. 60 tablet 11  . fluticasone (FLONASE) 50 MCG/ACT nasal spray Place 1 spray into both nostrils daily as needed for allergies or rhinitis.    Marland Kitchen loratadine (CLARITIN) 10 MG tablet Take 10 mg by mouth daily.    . metFORMIN (GLUCOPHAGE) 1000 MG tablet Take 1,000-1,500 mg by mouth 2 (two) times daily with a meal. *TAKES 1000MG  IN THE MORNING AND 1500MG  IN THE EVENING*    . multivitamin (THERAGRAN) per tablet Take 1 tablet by mouth daily.      . nitroGLYCERIN (NITROSTAT) 0.4 MG SL tablet Place 1 tablet (0.4 mg total) under the tongue every 5 (five) minutes as needed. 25 tablet 3  .  oxyCODONE (OXY IR/ROXICODONE) 5 MG immediate release tablet Take 1-2 tablets (5-10 mg total) by mouth every 3 (three) hours as needed for severe pain. 30 tablet 0  . quinapril (ACCUPRIL) 40 MG tablet Take 2 tablets (80 mg total) by mouth daily. 60 tablet 6  . rosuvastatin (CRESTOR) 10 MG tablet Take 1 tablet (10 mg total) by mouth daily. 30 tablet 6   No current facility-administered medications for this visit.    No Known Allergies  History   Social History  . Marital Status: Single    Spouse Name: N/A  . Number of Children: 2  . Years of Education: N/A   Occupational History  . [employer] Other  .     Social History Main Topics  .  Smoking status: Former Smoker -- 0.50 packs/day for 30 years    Types: Cigarettes  . Smokeless tobacco: Never Used     Comment: "quit smoking cigarettes in ~ 2007"  . Alcohol Use: No  . Drug Use: No  . Sexual Activity: Yes   Other Topics Concern  . Not on file   Social History Narrative    Family History  Problem Relation Age of Onset  . Coronary artery disease Father 74  . Hypertension Mother     Review of Systems:  As stated in the HPI and otherwise negative.   BP 138/98 mmHg  Pulse 86  Ht 5\' 10"  (1.778 m)  Wt 208 lb (94.348 kg)  BMI 29.84 kg/m2  SpO2 97%  Physical Examination: General: Well developed, well nourished, NAD HEENT: OP clear, mucus membranes moist SKIN: warm, dry. No rashes. Neuro: No focal deficits Musculoskeletal: Muscle strength 5/5 all ext Psychiatric: Mood and affect normal Neck: No JVD, no carotid bruits, no thyromegaly, no lymphadenopathy. Lungs:Clear bilaterally, no wheezes, rhonci, crackles Cardiovascular: Regular rate and rhythm. No murmurs, gallops or rubs. Abdomen:Soft. Bowel sounds present. Non-tender.  Extremities: No lower extremity edema. Pulses are 2 + in the bilateral DP/PT.  EKG:  Echo 02/24/15: Left ventricle: The cavity size was moderately dilated. Systolic function was severely reduced. The estimated ejection fraction was in the range of 20% to 25%. Wall motion was normal; there were no regional wall motion abnormalities. Doppler parameters are consistent with a restrictive pattern, indicative of decreased left ventricular diastolic compliance and/or increased left atrial pressure (grade 3 diastolic dysfunction). Doppler parameters are consistent with elevated ventricular end-diastolic filling pressure. - Aortic valve: Trileaflet; normal thickness leaflets. There was no regurgitation. - Aortic root: The aortic root was normal in size. - Mitral valve: Structurally normal valve. There was  mild regurgitation. - Left atrium: The atrium was mildly dilated. - Right ventricle: Systolic function was mildly reduced. - Right atrium: The atrium was normal in size. - Tricuspid valve: Structurally normal valve. There was trivial regurgitation. - Pulmonic valve: There was no regurgitation. - Pulmonary arteries: Systolic pressure was within the normal range. - Inferior vena cava: The vessel was normal in size. - Pericardium, extracardiac: There was no pericardial effusion. Impressions: - There is no significant difference when compared to the study from 12/08/2014.  Assessment and Plan:   1. CAD: s/p CABG December 09, 2014. Doing well. He is on ASA, Coreg, Accupril and Crestor.  He has had no chest pain concerning for angina. He is in cardiac rehab and doing well.   2. HYPERTENSION: BP has been elevated. Also noted in cardiac rehab. Quinapril increased last week. BP still elevated today. Will add aldactone 12.5 mg po Qdaily  given his cardiomyopathy and elevated BP.  Will check BMET in one week.   3. Ischemic cardiomyopathy: LVEF=20-25% s/p CABG 12/09/14. He is now more than 40 days out from his revascularization with no improvement in LVEF on optimal medical therapy including beta blocker and Ace-inh. Will refer to EP for consideration of ICD for primary prevention of sudden death. He does work for [employer] on [employer] and in Design[employer], jewellery. I have told him that he would not likely be able to continue this line of work if he gets an ICD. He will begin to discuss this with his management team to see if he can move to another job.

## 2015-03-06 ENCOUNTER — Encounter (HOSPITAL_COMMUNITY): Payer: BLUE CROSS/BLUE SHIELD

## 2015-03-08 ENCOUNTER — Encounter (HOSPITAL_COMMUNITY): Payer: BLUE CROSS/BLUE SHIELD

## 2015-03-10 ENCOUNTER — Other Ambulatory Visit (INDEPENDENT_AMBULATORY_CARE_PROVIDER_SITE_OTHER): Payer: BLUE CROSS/BLUE SHIELD | Admitting: *Deleted

## 2015-03-10 ENCOUNTER — Encounter (HOSPITAL_COMMUNITY): Payer: BLUE CROSS/BLUE SHIELD

## 2015-03-10 ENCOUNTER — Encounter (HOSPITAL_COMMUNITY): Payer: Self-pay | Admitting: Cardiac Rehabilitation

## 2015-03-10 DIAGNOSIS — I255 Ischemic cardiomyopathy: Secondary | ICD-10-CM

## 2015-03-10 LAB — BASIC METABOLIC PANEL
BUN: 13 mg/dL (ref 6–23)
CO2: 29 meq/L (ref 19–32)
Calcium: 9.5 mg/dL (ref 8.4–10.5)
Chloride: 104 mEq/L (ref 96–112)
Creatinine, Ser: 0.9 mg/dL (ref 0.40–1.50)
GFR: 91.1 mL/min (ref >60.00)
GLUCOSE: 140 mg/dL — AB (ref 70–99)
POTASSIUM: 4.4 meq/L (ref 3.5–5.1)
Sodium: 137 mEq/L (ref 135–145)

## 2015-03-10 NOTE — Progress Notes (Signed)
Domingo Sep 64 y.o. male Nutrition Note Spoke with pt. Pt graduated early. Pt stopped by Cardiac Rehab to complete discharge information. Nutrition Plan and Nutrition Survey goals reviewed with pt. Pt is following Step 1 of the Therapeutic Lifestyle Changes diet. Pt is diabetic. Last A1c indicates blood glucose fairly well controlled. Diabetes Education test results reviewed. Pt expressed understanding of the information discussed. Pt aware of nutrition education classes offered and has attended most nutrition classes offered. Lab Results  Component Value Date   HGBA1C 7.1* 12/08/2014   Nutrition Diagnosis ? Food-and nutrition-related knowledge deficit related to lack of exposure to information as related to diagnosis of: ? CVD ? DM  ? Obesity related to excessive energy intake as evidenced by a BMI of 30  Nutrition RX/ Estimated Daily Nutrition Needs for: wt loss  1650-2150 Kcal, 45-55 gm fat, 10-14 gm sat fat, 1.6-2.1 gm trans-fat, <1500 mg sodium, 175-250 gm CHO   Nutrition Intervention ? Pt's individual nutrition plan reviewed with pt. ? Benefits of adopting Therapeutic Lifestyle Changes discussed when Medficts reviewed. ? Pt to attend the Portion Distortion class - met; 02/22/15 ? Pt to attend the Diabetes Q & A class - met; 03/03/15 ? Pt to attend the   ? Nutrition I class - met; 02/21/15                 ? Nutrition II class - met; 02/28/15     ? Diabetes Blitz class    ? Continue client-centered nutrition education by RD, as part of interdisciplinary care. Goal(s) ? Pt to identify and limit food sources of saturated fat, trans fat, and cholesterol ? Pt to identify food quantities necessary to achieve: ? wt loss to a goal wt of 184-202 lb (83.9-92.1 kg) at graduation from cardiac rehab.  Monitor and Evaluate progress toward nutrition goal with team. Nutrition Risk: Change to Moderate Derek Mound, M.Ed, RD, LDN, CDE 03/10/2015 9:26 AM

## 2015-03-10 NOTE — Progress Notes (Addendum)
Pt exited cardiac rehab program after completion of 12 sessions to return to work full time.  Pt feels he has adequately met his rehab goals and is looking forward to  Return to work.  Pt plans to exercise on his own walking. Dr. Angelena Form aware of pt continued hypertension at rest and with exercise.  Medication adjustments have  Been made.  PHQ-0.  Pt returned homework packet. Medication list reconciled.

## 2015-03-13 ENCOUNTER — Encounter (HOSPITAL_COMMUNITY): Payer: BLUE CROSS/BLUE SHIELD

## 2015-03-13 ENCOUNTER — Other Ambulatory Visit (HOSPITAL_COMMUNITY): Payer: BLUE CROSS/BLUE SHIELD

## 2015-03-15 ENCOUNTER — Encounter (HOSPITAL_COMMUNITY): Payer: BLUE CROSS/BLUE SHIELD

## 2015-03-17 ENCOUNTER — Encounter (HOSPITAL_COMMUNITY): Payer: BLUE CROSS/BLUE SHIELD

## 2015-03-20 ENCOUNTER — Encounter (HOSPITAL_COMMUNITY): Payer: BLUE CROSS/BLUE SHIELD

## 2015-03-20 ENCOUNTER — Ambulatory Visit: Payer: BLUE CROSS/BLUE SHIELD | Admitting: Thoracic Surgery (Cardiothoracic Vascular Surgery)

## 2015-03-22 ENCOUNTER — Encounter (HOSPITAL_COMMUNITY): Payer: BLUE CROSS/BLUE SHIELD

## 2015-03-24 ENCOUNTER — Ambulatory Visit: Payer: BLUE CROSS/BLUE SHIELD | Admitting: Cardiovascular Disease

## 2015-03-24 ENCOUNTER — Encounter (HOSPITAL_COMMUNITY): Payer: BLUE CROSS/BLUE SHIELD

## 2015-03-27 ENCOUNTER — Ambulatory Visit: Payer: BLUE CROSS/BLUE SHIELD | Admitting: Thoracic Surgery (Cardiothoracic Vascular Surgery)

## 2015-03-27 ENCOUNTER — Encounter (HOSPITAL_COMMUNITY): Payer: BLUE CROSS/BLUE SHIELD

## 2015-04-04 ENCOUNTER — Encounter: Payer: Self-pay | Admitting: Cardiovascular Disease

## 2015-04-21 ENCOUNTER — Ambulatory Visit (INDEPENDENT_AMBULATORY_CARE_PROVIDER_SITE_OTHER): Payer: BLUE CROSS/BLUE SHIELD | Admitting: Internal Medicine

## 2015-04-21 ENCOUNTER — Encounter: Payer: Self-pay | Admitting: Internal Medicine

## 2015-04-21 VITALS — BP 124/82 | HR 80 | Ht 71.0 in | Wt 208.2 lb

## 2015-04-21 DIAGNOSIS — I10 Essential (primary) hypertension: Secondary | ICD-10-CM

## 2015-04-21 DIAGNOSIS — E782 Mixed hyperlipidemia: Secondary | ICD-10-CM | POA: Diagnosis not present

## 2015-04-21 DIAGNOSIS — Z9861 Coronary angioplasty status: Secondary | ICD-10-CM | POA: Diagnosis not present

## 2015-04-21 DIAGNOSIS — I2510 Atherosclerotic heart disease of native coronary artery without angina pectoris: Secondary | ICD-10-CM | POA: Diagnosis not present

## 2015-04-21 NOTE — Progress Notes (Signed)
HPI Zachary Ellis is referred today by Dr. Angelena Form for consideration of an ICD. He is a 64 year old man who has a history of coronary artery disease dating back to 2005. He had progressive symptoms and worsening disease and underwent echo in 2015 demonstrating an ejection fraction of 35%. Bypass surgery was carried out in January 2016 and repeat echocardiogram in March 2016 demonstrated an ejection fraction of 20%. He is on maximal medical therapy. He has not had syncope. 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 He has class I heart failure symptoms. No Known Allergies   Current Outpatient Prescriptions  Medication Sig Dispense Refill  . aspirin EC 325 MG EC tablet Take 1 tablet (325 mg total) by mouth daily. 30 tablet 0  . carvedilol (COREG) 25 MG tablet Take 1 tablet (25 mg total) by mouth 2 (two) times daily with a meal. 60 tablet 11  . fluticasone (FLONASE) 50 MCG/ACT nasal spray Place 1 spray into both nostrils daily as needed for allergies or rhinitis.    Marland Kitchen loratadine (CLARITIN) 10 MG tablet Take 10 mg by mouth daily.    . metFORMIN (GLUCOPHAGE) 1000 MG tablet Take 1,000-1,500 mg by mouth 2 (two) times daily with a meal. *TAKES 1000MG  IN THE MORNING AND 1500MG  IN THE EVENING*    . multivitamin (THERAGRAN) per tablet Take 1 tablet by mouth daily.      . nitroGLYCERIN (NITROSTAT) 0.4 MG SL tablet Place 1 tablet (0.4 mg total) under the tongue every 5 (five) minutes as needed. (Patient taking differently: Place 0.4 mg under the tongue every 5 (five) minutes as needed for chest pain (MAX 3 TABLETS). ) 25 tablet 3  . oxyCODONE (OXY IR/ROXICODONE) 5 MG immediate release tablet Take 1-2 tablets (5-10 mg total) by mouth every 3 (three) hours as needed for severe pain. 30 tablet 0  . quinapril (ACCUPRIL) 40 MG tablet Take 2 tablets (80 mg total) by mouth daily. 60 tablet 6  . rosuvastatin (CRESTOR) 10 MG tablet Take 1 tablet (10 mg total)  by mouth daily. 30 tablet 6  . spironolactone (ALDACTONE) 25 MG tablet Take 0.5 tablets (12.5 mg total) by mouth daily. 15 tablet 6   No current facility-administered medications for this visit.     Past Medical History  Diagnosis Date  . Coronary artery disease   . Hyperlipidemia, mixed   . Hypertension   . Hypothyroidism   . Kidney stones     "passed on my own" (2014/12/09)  . Anginal pain   . Type II diabetes mellitus   . 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   Past Surgical History  Procedure Laterality Date  . Wisdom tooth extraction  1980's    'all 4"  . Coronary angioplasty with stent placement  12/2003    CFX  . Coronary angioplasty  01/2004    failed LAD PCI  . Cardiac catheterization  12-09-2014    "recommending about OHS"  . Left heart catheterization with coronary angiogram N/A 12/09/14    Procedure: LEFT HEART CATHETERIZATION WITH CORONARY ANGIOGRAM;  Surgeon: Burnell Blanks, MD;  Location: [facility] CATH LAB;  Service: Cardiovascular;  Laterality: N/A;  . Coronary artery bypass graft N/A 12/09/2014  Procedure: CORONARY ARTERY BYPASS GRAFTING (CABG), ON PUMP, TIMES FOUR, USING LEFT INTERNAL MAMMARY ARTERY, RIGHT GREATER SAPHENOUS VEIN HARVESTED ENDOSCOPICALLY;  Surgeon: Rexene Alberts, MD;  Location: [place];  Service: Open Heart Surgery;  Laterality: N/A;  . Intraoperative transesophageal echocardiogram N/A 12/09/2014    Procedure: INTRAOPERATIVE TRANSESOPHAGEAL ECHOCARDIOGRAM;  Surgeon: Rexene Alberts, MD;  Location: [place];  Service: Open Heart Surgery;  Laterality: N/A;     Family History  Problem Relation Age of Onset  . Coronary artery disease Father 23  . Hypertension Mother   . Heart attack Paternal Grandfather     x2 (MI at age 92 was his cause of death)     History   Social History  . Marital  Status: Single    Spouse Name: N/A  . Number of Children: 2  . Years of Education: N/A   Occupational History  . [employer] Other  .     Social History Main Topics  . Smoking status: Former Smoker -- 0.50 packs/day for 30 years    Types: Cigarettes  . Smokeless tobacco: Never Used     Comment: "quit smoking cigarettes in ~ 2007"  . Alcohol Use: No  . Drug Use: No  . Sexual Activity: Yes   Other Topics Concern  . Not on file   Social History Narrative     BP 124/82 mmHg  Pulse 80  Ht 5\' 11"  (1.803 m)  Wt 208 lb 3.2 oz (94.439 kg)  BMI 29.05 kg/m2  Physical Exam:  Well appearing 64 year old man, NAD HEENT: Unremarkable Neck:  No JVD, no thyromegally Back:  No CVA tenderness Lungs:  Clear, with no wheezes, rales, or rhonchi. HEART:  Regular rate rhythm, no murmurs, no rubs, no clicks Abd:  soft, positive bowel sounds, no organomegally, no rebound, no guarding Ext:  2 plus pulses, no edema, no cyanosis, no clubbing Skin:  No rashes no nodules Neuro:  CN II through XII intact, motor grossly intact  EKG - normal sinus rhythm with left axis deviation, and prior inferior and lateral MI.   Assess/Plan:

## 2015-04-21 NOTE — Assessment & Plan Note (Signed)
The patient has an ischemic cardiomyopathy, status post bypass surgery, and now has severe left ventricular dysfunction with an ejection fraction of 20%. Previously, his ejection fraction was 35%. Despite this he has done well and denies angina. He has a clear indication for ICD implantation. I discussed the risk and benefits, goals and expectations of ICD implantation with the patient. He is interested in proceeding. He is concerned however because he works around high voltage. We discussed different options with regard to this. A subcutaneous ICD has potential benefit in terms of not being inside the chest. Worse case narrative would be that the patient have to change jobs and work away from any high-voltage lines. It's unlikely that this be the case but it is not impossible. I've explained this to the patient. He will call us if he would like to proceed with ICD implantation. We will screen him for subcutaneous device.

## 2015-04-21 NOTE — Assessment & Plan Note (Signed)
He will continue his statin therapy and maintain a low fat diet.  

## 2015-04-21 NOTE — Patient Instructions (Addendum)
Medication Instructions:  Your physician recommends that you continue on your current medications as directed. Please refer to the Current Medication list given to you today.   Labwork: NONE  Testing/Procedures: NONE  Follow-Up: Call our office to let us know decision on implantable device.  If you decide on subcutaneous device we will setup another appointment.

## 2015-04-21 NOTE — Assessment & Plan Note (Signed)
His blood pressure is controlled. He will continue his current medications and maintain a low-sodium diet.

## 2015-05-04 ENCOUNTER — Telehealth: Payer: Self-pay | Admitting: Cardiovascular Disease

## 2015-05-04 ENCOUNTER — Telehealth: Payer: Self-pay | Admitting: Internal Medicine

## 2015-05-04 NOTE — Telephone Encounter (Deleted)
ERROR

## 2015-05-04 NOTE — Telephone Encounter (Signed)
New Prob    Pt calling to follow up with Dr. Lovena Le regarding plans to measure wires for future defib placement. Please call.

## 2015-05-04 NOTE — Telephone Encounter (Signed)
Spoke with patient and he is going to come in to be measured for SICD next Fri 05/12/15 at [facility] aware

## 2015-05-09 ENCOUNTER — Telehealth: Payer: Self-pay | Admitting: Internal Medicine

## 2015-05-09 NOTE — Telephone Encounter (Signed)
New message    Patient calling want to discuss measurement , pace maker

## 2015-05-11 NOTE — Telephone Encounter (Signed)
I have him scheduled with Raymondo Band for tomorrow at [facility]

## 2015-06-16 ENCOUNTER — Ambulatory Visit (INDEPENDENT_AMBULATORY_CARE_PROVIDER_SITE_OTHER): Payer: BLUE CROSS/BLUE SHIELD | Admitting: Cardiovascular Disease

## 2015-06-16 ENCOUNTER — Encounter: Payer: Self-pay | Admitting: Cardiovascular Disease

## 2015-06-16 VITALS — BP 140/86 | HR 79 | Ht 71.0 in | Wt 209.0 lb

## 2015-06-16 DIAGNOSIS — I255 Ischemic cardiomyopathy: Secondary | ICD-10-CM

## 2015-06-16 DIAGNOSIS — I10 Essential (primary) hypertension: Secondary | ICD-10-CM | POA: Diagnosis not present

## 2015-06-16 DIAGNOSIS — I2510 Atherosclerotic heart disease of native coronary artery without angina pectoris: Secondary | ICD-10-CM

## 2015-06-16 LAB — BASIC METABOLIC PANEL
BUN: 13 mg/dL (ref 6–23)
CHLORIDE: 105 meq/L (ref 96–112)
CO2: 29 meq/L (ref 19–32)
CREATININE: 0.95 mg/dL (ref 0.40–1.50)
Calcium: 9.3 mg/dL (ref 8.4–10.5)
GFR: 85.51 mL/min (ref >60.00)
GLUCOSE: 119 mg/dL — AB (ref 70–99)
POTASSIUM: 4.2 meq/L (ref 3.5–5.1)
Sodium: 138 mEq/L (ref 135–145)

## 2015-06-16 NOTE — Patient Instructions (Signed)
Medication Instructions:  Your physician recommends that you continue on your current medications as directed. Please refer to the Current Medication list given to you today.   Labwork: Lab work to be done today--BMP  Testing/Procedures: none  Follow-Up: Your physician wants you to follow-up in: 6 months.  You will receive a reminder letter in the mail two months in advance. If you don't receive a letter, please call our office to schedule the follow-up appointment.

## 2015-06-16 NOTE — Progress Notes (Signed)
CC: Follow up CAD and ischemic cardiomyopathy   History of Present Illness: 64 yo WM with history of CAD s/p CABG, HTN, HLD, DM here today for cardiac follow up. He has been followed in the past by Dr. Olevia Perches. 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 There was an unsuccessful attempt at PCI of a chronically occluded LAD. He was admitted to [facility] December 2015 with unstable angina and CHF. Cardiac cath demonstrated severe three vessel CAD. He underwent 4V CABG per Dr. Roxy Manns ( LIMA to LAD, SVG to RCA, SVG to OM, & SVG to Diagonal). LVEF noted to be 35% by cath.  Echo 02/24/15 with LVEF=20-25%.   He is here today for follow up. He denies chest pain, SOB or palpitations.  Still working full time without limitations.   Primary Care Physician: Claris Gower  Last Lipid Profile: Followed in primary care  Past Medical History  Diagnosis Date  . Coronary artery disease   . Hyperlipidemia, mixed   . Hypertension   . Hypothyroidism   . Kidney stones     "passed on my own" (2014-12-23)  . Anginal pain   . Type II diabetes mellitus   . 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 Wisdom tooth extraction  1980's    'all 4"  . Coronary angioplasty with stent placement  12/2003    CFX  . Coronary angioplasty  01/2004    failed LAD PCI  . Cardiac catheterization  December 23, 2014    "recommending about OHS"  . Left heart catheterization with coronary angiogram N/A 12-23-2014    Procedure: LEFT HEART CATHETERIZATION WITH CORONARY ANGIOGRAM;  Surgeon: Burnell Blanks, MD;  Location: [facility] CATH LAB;  Service: Cardiovascular;  Laterality: N/A;  . Coronary artery bypass graft N/A 12/09/2014    Procedure: CORONARY ARTERY BYPASS GRAFTING (CABG), ON PUMP, TIMES FOUR, USING LEFT INTERNAL MAMMARY ARTERY, RIGHT GREATER SAPHENOUS VEIN HARVESTED  ENDOSCOPICALLY;  Surgeon: Rexene Alberts, MD;  Location: [place];  Service: Open Heart Surgery;  Laterality: N/A;  . Intraoperative transesophageal echocardiogram N/A 12/09/2014    Procedure: INTRAOPERATIVE TRANSESOPHAGEAL ECHOCARDIOGRAM;  Surgeon: Rexene Alberts, MD;  Location: [place];  Service: Open Heart Surgery;  Laterality: N/A;    Current Outpatient Prescriptions  Medication Sig Dispense Refill  . aspirin EC 325 MG EC tablet Take 1 tablet (325 mg total) by mouth daily. 30 tablet 0  . carvedilol (COREG) 25 MG tablet Take 1 tablet (25 mg total) by mouth 2 (two) times daily with a meal. 60 tablet 11  . fluticasone (FLONASE) 50 MCG/ACT nasal spray Place 1 spray into both nostrils daily as needed for allergies or rhinitis.    Marland Kitchen loratadine (CLARITIN) 10 MG tablet Take 10 mg by mouth daily.    . metFORMIN (GLUCOPHAGE) 1000 MG tablet Take 1,000-1,500 mg by mouth 2 (two) times daily with a meal. *TAKES 1000MG  IN THE MORNING AND 1500MG  IN THE EVENING*    . multivitamin (THERAGRAN) per tablet Take 1 tablet by mouth daily.      . nitroGLYCERIN (NITROSTAT) 0.4 MG SL tablet Place 1 tablet (0.4 mg total) under the tongue every 5 (five) minutes as needed. (Patient taking differently: Place 0.4 mg under the tongue every 5 (five) minutes as needed for chest  pain (MAX 3 TABLETS). ) 25 tablet 3  . quinapril (ACCUPRIL) 40 MG tablet Take 2 tablets (80 mg total) by mouth daily. 60 tablet 6  . rosuvastatin (CRESTOR) 10 MG tablet Take 1 tablet (10 mg total) by mouth daily. 30 tablet 6  . spironolactone (ALDACTONE) 25 MG tablet Take 0.5 tablets (12.5 mg total) by mouth daily. 15 tablet 6   No current facility-administered medications for this visit.    No Known Allergies  History   Social History  . Marital Status: Single    Spouse Name: N/A  . Number of Children: 2  . Years of Education: N/A   Occupational History  . [employer] Other  .     Social History Main Topics  . Smoking status:  Former Smoker -- 0.50 packs/day for 30 years    Types: Cigarettes  . Smokeless tobacco: Never Used     Comment: "quit smoking cigarettes in ~ 2007"  . Alcohol Use: No  . Drug Use: No  . Sexual Activity: Yes   Other Topics Concern  . Not on file   Social History Narrative    Family History  Problem Relation Age of Onset  . Coronary artery disease Father 15  . Hypertension Mother   . Heart attack Paternal Grandfather     x2 (MI at age 74 was his cause of death)    Review of Systems:  As stated in the HPI and otherwise negative.   BP 140/86 mmHg  Pulse 79  Ht 5\' 11"  (1.803 m)  Wt 209 lb (94.802 kg)  BMI 29.16 kg/m2  Physical Examination: General: Well developed, well nourished, NAD HEENT: OP clear, mucus membranes moist SKIN: warm, dry. No rashes. Neuro: No focal deficits Musculoskeletal: Muscle strength 5/5 all ext Psychiatric: Mood and affect normal Neck: No JVD, no carotid bruits, no thyromegaly, no lymphadenopathy. Lungs:Clear bilaterally, no wheezes, rhonci, crackles Cardiovascular: Regular rate and rhythm. No murmurs, gallops or rubs. Abdomen:Soft. Bowel sounds present. Non-tender.  Extremities: No lower extremity edema. Pulses are 2 + in the bilateral DP/PT.  EKG:  Echo 02/24/15: Left ventricle: The cavity size was moderately dilated. Systolic function was severely reduced. The estimated ejection fraction was in the range of 20% to 25%. Wall motion was normal; there were no regional wall motion abnormalities. Doppler parameters are consistent with a restrictive pattern, indicative of decreased left ventricular diastolic compliance and/or increased left atrial pressure (grade 3 diastolic dysfunction). Doppler parameters are consistent with elevated ventricular end-diastolic filling pressure. - Aortic valve: Trileaflet; normal thickness leaflets. There was no regurgitation. - Aortic root: The aortic root was normal in size. - Mitral valve:  Structurally normal valve. There was mild regurgitation. - Left atrium: The atrium was mildly dilated. - Right ventricle: Systolic function was mildly reduced. - Right atrium: The atrium was normal in size. - Tricuspid valve: Structurally normal valve. There was trivial regurgitation. - Pulmonic valve: There was no regurgitation. - Pulmonary arteries: Systolic pressure was within the normal range. - Inferior vena cava: The vessel was normal in size. - Pericardium, extracardiac: There was no pericardial effusion. Impressions: - There is no significant difference when compared to the study from 12/08/2014.  EKG:  EKG is not ordered today. The ekg ordered today demonstrates   Recent Labs: 12/08/2014: Pro B Natriuretic peptide (BNP) 167.1*; TSH 0.459 12/10/2014: Magnesium 2.1 01/17/2015: Hemoglobin 12.3*; Platelets 289.0 02/10/2015: ALT 17 06/16/2015: BUN 13; Creatinine, Ser 0.95; Potassium 4.2; Sodium 138   Lipid Panel  Component Value Date/Time   CHOL 115 02/10/2015 0834   TRIG 123.0 02/10/2015 0834   HDL 26.90* 02/10/2015 0834   CHOLHDL 4 02/10/2015 0834   VLDL 24.6 02/10/2015 0834   LDLCALC 64 02/10/2015 0834     Wt Readings from Last 3 Encounters:  06/16/15 209 lb (94.802 kg)  04/21/15 208 lb 3.2 oz (94.439 kg)  03/03/15 208 lb (94.348 kg)     Other studies Reviewed: Additional studies/ records that were reviewed today include: . Review of the above records demonstrates:    Assessment and Plan:   1. CAD: s/p CABG December 09, 2014. Doing well. He is on ASA, Coreg, Accupril and Crestor.  He has had no chest pain concerning for angina. He is in cardiac rehab and doing well.   2. HYPERTENSION: BP has been well controlled. No changes today.    3. Ischemic cardiomyopathy: LVEF=20-25% s/p CABG 12/09/14. He has been seen by Dr. Lovena Le and there has been a discussion regarding an ICD but he is still working with high voltage so does not qualify for traditional  ICD. Will check BMET today. Will contact EP clinic to arrange f/u discussion with him regarding ICD.   Current medicines are reviewed at length with the patient today.  The patient does not have concerns regarding medicines.  The following changes have been made:  no change  Labs/ tests ordered today include:   Orders Placed This Encounter  Procedures  . Basic Metabolic Panel (BMET)    Disposition:   FU with me in 6 months  Signed, Lauree Chandler, MD 06/16/2015 5:20 PM    [facility] [place], [place], [place]  [address] Phone: [phone]; Fax: [phone]

## 2015-06-19 ENCOUNTER — Telehealth: Payer: Self-pay | Admitting: Internal Medicine

## 2015-06-19 NOTE — Telephone Encounter (Signed)
Left message to call back  

## 2015-06-19 NOTE — Telephone Encounter (Signed)
New Message    Pt states he is returning your call

## 2015-06-19 NOTE — Telephone Encounter (Signed)
Spoke with pt and reviewed BMP results with him.  

## 2015-06-19 NOTE — Telephone Encounter (Signed)
Follow Up    Pt calling back returning RN call

## 2015-07-21 ENCOUNTER — Telehealth: Payer: Self-pay | Admitting: Internal Medicine

## 2015-07-21 NOTE — Telephone Encounter (Signed)
Spoke with patient and let him know I would discuss with Dr Lovena Le next week.  He was not compatible for the subq.

## 2015-07-21 NOTE — Telephone Encounter (Signed)
New message     Pt is suppose to be getting defibulator.   Pt is checking to see if doctor has decided on the procedure. Please call to discuss

## 2015-07-28 NOTE — Telephone Encounter (Signed)
Patient has a follow up on 09/08/15 with Dr Lovena Le.  Zachary Ellis with [employer] is going to be in contact with the patient and go to his job site and test areas to check for interference prior to his appointment so Dr Lovena Le can discuss with him and make plan in regards to ICD

## 2015-09-08 ENCOUNTER — Encounter: Payer: Self-pay | Admitting: Internal Medicine

## 2015-09-08 ENCOUNTER — Ambulatory Visit (INDEPENDENT_AMBULATORY_CARE_PROVIDER_SITE_OTHER): Payer: BLUE CROSS/BLUE SHIELD | Admitting: Internal Medicine

## 2015-09-08 DIAGNOSIS — I5022 Chronic systolic (congestive) heart failure: Secondary | ICD-10-CM

## 2015-09-08 DIAGNOSIS — I5020 Unspecified systolic (congestive) heart failure: Secondary | ICD-10-CM | POA: Insufficient documentation

## 2015-09-08 HISTORY — DX: Chronic systolic (congestive) heart failure: I50.22

## 2015-09-08 NOTE — Patient Instructions (Signed)
Medication Instructions:  Your physician recommends that you continue on your current medications as directed. Please refer to the Current Medication list given to you today.  Labwork: None ordered  Testing/Procedures: Your physician has requested that you have an echocardiogram - have this in November/December. Echocardiography is a painless test that uses sound waves to create images of your heart. It provides your doctor with information about the size and shape of your heart and how well your heart's chambers and valves are working. This procedure takes approximately one hour. There are no restrictions for this procedure.  Follow-Up: Your physician recommends that you schedule a follow-up appointment for after echo is completed with Dr. Lovena Le.   Any Other Special Instructions Will Be Listed Below (If Applicable). Thank you for choosing [facility]!!

## 2015-09-08 NOTE — Progress Notes (Signed)
HPI [name] returns today for ongoing consideration of an ICD. He is a 64 year old man who has a history of coronary artery disease dating back to 2005. He had progressive symptoms and worsening disease and underwent echo in 2015 demonstrating an ejection fraction of 35%. Bypass surgery was carried out in January 2016 and repeat echocardiogram in March 2016 demonstrated an ejection fraction of 20%. He is on maximal medical therapy. He has not had syncope. 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 He has class I heart failure symptoms. His EF was 35% by echo. He has felt better.  No Known Allergies   Current Outpatient Prescriptions  Medication Sig Dispense Refill  . aspirin EC 325 MG EC tablet Take 1 tablet (325 mg total) by mouth daily. 30 tablet 0  . carvedilol (COREG) 25 MG tablet Take 1 tablet (25 mg total) by mouth 2 (two) times daily with a meal. 60 tablet 11  . fluticasone (FLONASE) 50 MCG/ACT nasal spray Place 1 spray into both nostrils daily as needed for allergies or rhinitis.    Marland Kitchen loratadine (CLARITIN) 10 MG tablet Take 10 mg by mouth daily.    . metFORMIN (GLUCOPHAGE) 1000 MG tablet Take 1,000-1,500 mg by mouth 2 (two) times daily with a meal. *TAKES 1000MG  IN THE MORNING AND 1500MG  IN THE EVENING*    . multivitamin (THERAGRAN) per tablet Take 1 tablet by mouth daily.      . nitroGLYCERIN (NITROSTAT) 0.4 MG SL tablet Place 1 tablet (0.4 mg total) under the tongue every 5 (five) minutes as needed. (Patient taking differently: Place 0.4 mg under the tongue every 5 (five) minutes as needed for chest pain (MAX 3 TABLETS). ) 25 tablet 3  . quinapril (ACCUPRIL) 40 MG tablet Take 2 tablets (80 mg total) by mouth daily. 60 tablet 6  . rosuvastatin (CRESTOR) 10 MG tablet Take 1 tablet (10 mg total) by mouth daily. 30 tablet 6  . spironolactone (ALDACTONE) 25 MG tablet Take 0.5 tablets (12.5 mg total) by mouth daily. 15 tablet 6     No current facility-administered medications for this visit.     Past Medical History  Diagnosis Date  . Coronary artery disease   . Hyperlipidemia, mixed   . Hypertension   . Hypothyroidism   . Kidney stones     "passed on my own" (12-13-14)  . Anginal pain   . Type II diabetes mellitus   . 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   Past Surgical History  Procedure Laterality Date  . Wisdom tooth extraction  1980's    'all 4"  . Coronary angioplasty with stent placement  12/2003    CFX  . Coronary angioplasty  01/2004    failed LAD PCI  . Cardiac catheterization  Dec 13, 2014    "recommending about OHS"  . Left heart catheterization with coronary angiogram N/A Dec 13, 2014    Procedure: LEFT HEART CATHETERIZATION WITH CORONARY ANGIOGRAM;  Surgeon: Burnell Blanks, MD;  Location: [facility] CATH LAB;  Service: Cardiovascular;  Laterality: N/A;  . Coronary artery bypass graft N/A 12/09/2014    Procedure: CORONARY ARTERY BYPASS GRAFTING (CABG), ON PUMP, TIMES FOUR, USING LEFT INTERNAL MAMMARY ARTERY, RIGHT GREATER SAPHENOUS VEIN  HARVESTED ENDOSCOPICALLY;  Surgeon: Rexene Alberts, MD;  Location: [place];  Service: Open Heart Surgery;  Laterality: N/A;  . Intraoperative transesophageal echocardiogram N/A 12/09/2014    Procedure: INTRAOPERATIVE TRANSESOPHAGEAL ECHOCARDIOGRAM;  Surgeon: Rexene Alberts, MD;  Location: [place];  Service: Open Heart Surgery;  Laterality: N/A;     Family History  Problem Relation Age of Onset  . Coronary artery disease Father 17  . Hypertension Mother   . Heart attack Paternal Grandfather     x2 (MI at age 86 was his cause of death)     Social History   Social History  . Marital Status: Single    Spouse Name: N/A  . Number of Children: 2  . Years of Education: N/A   Occupational History  . [employer] Other  .     Social History Main Topics  . Smoking status: Former Smoker -- 0.50 packs/day for 30 years    Types: Cigarettes  . Smokeless tobacco: Never Used     Comment: "quit smoking cigarettes in ~ 2007"  . Alcohol Use: No  . Drug Use: No  . Sexual Activity: Yes   Other Topics Concern  . Not on file   Social History Narrative     BP 138/88 mmHg  Pulse 78  Ht 5\' 10"  (1.778 m)  Wt 212 lb (96.163 kg)  BMI 30.42 kg/m2  SpO2 97%  Physical Exam:  Well appearing 64 year old man, NAD HEENT: Unremarkable Neck:  6 cm JVD, no thyromegally Back:  No CVA tenderness Lungs:  Clear, with no wheezes, rales, or rhonchi. HEART:  Regular rate rhythm, no murmurs, no rubs, no clicks Abd:  soft, positive bowel sounds, no organomegally, no rebound, no guarding Ext:  2 plus pulses, no edema, no cyanosis, no clubbing Skin:  No rashes no nodules Neuro:  CN II through XII intact, motor grossly intact  EKG - normal sinus rhythm with left axis deviation, and prior inferior and lateral MI.   Assess/Plan:

## 2015-09-08 NOTE — Assessment & Plan Note (Signed)
His blood pressure is well controlled most of the time. A little high today. He is encouraged to maintain a low sodium diet.

## 2015-09-08 NOTE — Assessment & Plan Note (Signed)
He has felt well despite his LV dysfunction. I discussed the treatment options in detail. He has no plans for retiring or changing jobs. He works around very high voltage. I have asked the patient to return in a couple of months for a repeat echo. I have explained to him that while I have never seen a problem with high voltage interfering with an ICD function, I cannot guarantee this. His is not inclined to proceed with ICD implant at this time. Will rediscuss with him after his repeat echo. He is on maximal medical therapy.

## 2015-09-30 ENCOUNTER — Other Ambulatory Visit: Payer: Self-pay | Admitting: Cardiovascular Disease

## 2015-11-06 ENCOUNTER — Other Ambulatory Visit: Payer: Self-pay | Admitting: *Deleted

## 2015-11-06 MED ORDER — ROSUVASTATIN CALCIUM 10 MG PO TABS: 10.0000 mg | ORAL_TABLET | Freq: Every day | ORAL | Status: DC

## 2015-11-10 ENCOUNTER — Other Ambulatory Visit: Payer: Self-pay | Admitting: *Deleted

## 2015-11-10 ENCOUNTER — Other Ambulatory Visit (HOSPITAL_COMMUNITY): Payer: BLUE CROSS/BLUE SHIELD

## 2015-11-10 MED ORDER — CARVEDILOL 25 MG PO TABS: 25.0000 mg | ORAL_TABLET | Freq: Two times a day (BID) | ORAL | Status: DC

## 2015-11-10 MED ORDER — QUINAPRIL HCL 40 MG PO TABS: 80.0000 mg | ORAL_TABLET | Freq: Every day | ORAL | Status: DC

## 2015-11-24 ENCOUNTER — Ambulatory Visit (HOSPITAL_COMMUNITY): Payer: BLUE CROSS/BLUE SHIELD | Attending: Cardiovascular Disease

## 2015-11-24 ENCOUNTER — Other Ambulatory Visit: Payer: Self-pay

## 2015-11-24 DIAGNOSIS — I071 Rheumatic tricuspid insufficiency: Secondary | ICD-10-CM | POA: Insufficient documentation

## 2015-11-24 DIAGNOSIS — I5022 Chronic systolic (congestive) heart failure: Secondary | ICD-10-CM | POA: Diagnosis not present

## 2015-11-24 DIAGNOSIS — I10 Essential (primary) hypertension: Secondary | ICD-10-CM | POA: Insufficient documentation

## 2015-11-24 DIAGNOSIS — E785 Hyperlipidemia, unspecified: Secondary | ICD-10-CM | POA: Insufficient documentation

## 2015-11-24 DIAGNOSIS — I340 Nonrheumatic mitral (valve) insufficiency: Secondary | ICD-10-CM | POA: Insufficient documentation

## 2015-11-24 DIAGNOSIS — Z87891 Personal history of nicotine dependence: Secondary | ICD-10-CM | POA: Insufficient documentation

## 2015-11-24 DIAGNOSIS — R29898 Other symptoms and signs involving the musculoskeletal system: Secondary | ICD-10-CM | POA: Insufficient documentation

## 2015-11-24 DIAGNOSIS — I517 Cardiomegaly: Secondary | ICD-10-CM | POA: Insufficient documentation

## 2015-11-24 DIAGNOSIS — E119 Type 2 diabetes mellitus without complications: Secondary | ICD-10-CM | POA: Diagnosis not present

## 2015-12-01 ENCOUNTER — Ambulatory Visit (INDEPENDENT_AMBULATORY_CARE_PROVIDER_SITE_OTHER): Payer: BLUE CROSS/BLUE SHIELD | Admitting: Internal Medicine

## 2015-12-01 ENCOUNTER — Encounter: Payer: Self-pay | Admitting: Internal Medicine

## 2015-12-01 VITALS — BP 132/84 | HR 62 | Ht 71.0 in | Wt 211.2 lb

## 2015-12-01 DIAGNOSIS — I2510 Atherosclerotic heart disease of native coronary artery without angina pectoris: Secondary | ICD-10-CM | POA: Diagnosis not present

## 2015-12-01 DIAGNOSIS — I5022 Chronic systolic (congestive) heart failure: Secondary | ICD-10-CM

## 2015-12-01 DIAGNOSIS — I10 Essential (primary) hypertension: Secondary | ICD-10-CM | POA: Diagnosis not present

## 2015-12-01 DIAGNOSIS — Z9861 Coronary angioplasty status: Secondary | ICD-10-CM | POA: Diagnosis not present

## 2015-12-01 NOTE — Assessment & Plan Note (Signed)
He denies anginal symptoms. He will continue his current meds.  

## 2015-12-01 NOTE — Progress Notes (Signed)
HPI [name] returns today for ongoing consideration of an ICD. He is a 64 year old man who has a history of coronary artery disease dating back to 2005. He had progressive symptoms and worsening disease and underwent echo in 2015 demonstrating an ejection fraction of 35%. Bypass surgery was carried out in January 2016 and repeat echocardiogram in March 2016 demonstrated an ejection fraction of 20%. He is on maximal medical therapy. He has not had syncope. 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 He has class I heart failure symptoms. His EF was 35% by echo a year ago. He felt better. He has undergone repeat echo and his EF remains 25-30%.   No Known Allergies   Current Outpatient Prescriptions  Medication Sig Dispense Refill  . aspirin EC 325 MG EC tablet Take 1 tablet (325 mg total) by mouth daily. 30 tablet 0  . carvedilol (COREG) 25 MG tablet Take 1 tablet (25 mg total) by mouth 2 (two) times daily with a meal. 60 tablet 3  . fluticasone (FLONASE) 50 MCG/ACT nasal spray Place 1 spray into both nostrils daily as needed for allergies or rhinitis.    Marland Kitchen loratadine (CLARITIN) 10 MG tablet Take 10 mg by mouth daily.    . metFORMIN (GLUCOPHAGE) 1000 MG tablet Take 1,000-1,500 mg by mouth 2 (two) times daily with a meal. *TAKES 1000MG  IN THE MORNING AND 1500MG  IN THE EVENING*    . multivitamin (THERAGRAN) per tablet Take 1 tablet by mouth daily.      . nitroGLYCERIN (NITROSTAT) 0.4 MG SL tablet Place 0.4 mg under the tongue every 5 (five) minutes x 3 doses as needed for chest pain.    Marland Kitchen quinapril (ACCUPRIL) 40 MG tablet Take 2 tablets (80 mg total) by mouth daily. 60 tablet 3  . rosuvastatin (CRESTOR) 10 MG tablet Take 1 tablet (10 mg total) by mouth daily. 30 tablet 6  . spironolactone (ALDACTONE) 25 MG tablet TAKE ONE-HALF TABLET BY MOUTH ONCE DAILY 30 tablet 2   No current facility-administered medications for this visit.      Past Medical History  Diagnosis Date  . Coronary artery disease   . Hyperlipidemia, mixed   . Hypertension   . Hypothyroidism   . Kidney stones     "passed on my own" (12-20-14)  . Anginal pain ([place])   . Type II diabetes mellitus ([place])   . 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   Past Surgical History  Procedure Laterality Date  . Wisdom tooth extraction  1980's    'all 4"  . Coronary angioplasty with stent placement  12/2003    CFX  . Coronary angioplasty  01/2004    failed LAD PCI  . Cardiac catheterization  2014-12-20    "recommending about OHS"  . Left heart catheterization with coronary angiogram N/A 12/20/2014    Procedure: LEFT HEART CATHETERIZATION WITH CORONARY ANGIOGRAM;  Surgeon: Burnell Blanks, MD;  Location: [facility] CATH LAB;  Service: Cardiovascular;  Laterality: N/A;  . Coronary artery bypass graft N/A 12/09/2014    Procedure: CORONARY ARTERY BYPASS GRAFTING (CABG), ON PUMP, TIMES FOUR, USING LEFT INTERNAL MAMMARY ARTERY, RIGHT GREATER SAPHENOUS VEIN HARVESTED ENDOSCOPICALLY;  Surgeon: Rexene Alberts, MD;  Location: [facility] OR;  Service: Open Heart Surgery;  Laterality: N/A;  . Intraoperative transesophageal echocardiogram N/A 12/09/2014    Procedure: INTRAOPERATIVE TRANSESOPHAGEAL ECHOCARDIOGRAM;  Surgeon: Rexene Alberts, MD;  Location: [facility];  Service: Open Heart Surgery;  Laterality: N/A;     Family History  Problem Relation Age of Onset  . Coronary artery disease Father 81  . Hypertension Mother   . Heart attack Paternal Grandfather     x2 (MI at age 54 was his cause of death)     Social History   Social History  . Marital Status: Single    Spouse Name: N/A  . Number of Children: 2  . Years of Education: N/A   Occupational History  . [employer] Other  .     Social History Main  Topics  . Smoking status: Former Smoker -- 0.50 packs/day for 30 years    Types: Cigarettes  . Smokeless tobacco: Never Used     Comment: "quit smoking cigarettes in ~ 2007"  . Alcohol Use: No  . Drug Use: No  . Sexual Activity: Yes   Other Topics Concern  . Not on file   Social History Narrative     BP 132/84 mmHg  Pulse 62  Ht 5\' 11"  (1.803 m)  Wt 211 lb 3.2 oz (95.8 kg)  BMI 29.47 kg/m2  Physical Exam:  Well appearing 64 year old man, NAD HEENT: Unremarkable Neck:  6 cm JVD, no thyromegally Back:  No CVA tenderness Lungs:  Clear, with no wheezes, rales, or rhonchi. HEART:  Regular rate rhythm, no murmurs, no rubs, no clicks Abd:  soft, positive bowel sounds, no organomegally, no rebound, no guarding Ext:  2 plus pulses, no edema, no cyanosis, no clubbing Skin:  No rashes no nodules Neuro:  CN II through XII intact, motor grossly intact  EKG - normal sinus rhythm with left axis deviation, and prior inferior and lateral MI. QRS 114  Assess/Plan:

## 2015-12-01 NOTE — Patient Instructions (Signed)
Medication Instructions:  Your physician recommends that you continue on your current medications as directed. Please refer to the Current Medication list given to you today.  Labwork: None ordered  Testing/Procedures: None ordered  Follow-Up: Your physician wants you to follow-up in: 1 year with Dr. Taylor. You will receive a reminder letter in the mail two months in advance. If you don't receive a letter, please call our office to schedule the follow-up appointment.  If you need a refill on your cardiac medications before your next appointment, please call your pharmacy.  Thank you for choosing [facility]!!        

## 2015-12-01 NOTE — Assessment & Plan Note (Signed)
HIs blood pressure has been well controlled. He will maintain a low sodium diet.

## 2015-12-01 NOTE — Assessment & Plan Note (Signed)
His symptoms remain class 1. We discussed the treatment options. 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 He will continue his current meds and he has elected not to proceed with ICD implant. I have cautioned him that if his symptoms change and he develops syncope, worsening sob or edema, we would recommend he retire and undergo ICD implant. He will continue his current medical therapy under the direction of Dr. Jearld Lesch.

## 2015-12-04 ENCOUNTER — Ambulatory Visit (INDEPENDENT_AMBULATORY_CARE_PROVIDER_SITE_OTHER): Payer: BLUE CROSS/BLUE SHIELD | Admitting: Thoracic Surgery (Cardiothoracic Vascular Surgery)

## 2015-12-04 ENCOUNTER — Encounter: Payer: Self-pay | Admitting: Thoracic Surgery (Cardiothoracic Vascular Surgery)

## 2015-12-04 VITALS — BP 143/88 | HR 75 | Resp 16 | Ht 71.0 in | Wt 211.0 lb

## 2015-12-04 DIAGNOSIS — I25119 Atherosclerotic heart disease of native coronary artery with unspecified angina pectoris: Secondary | ICD-10-CM | POA: Diagnosis not present

## 2015-12-04 DIAGNOSIS — Z951 Presence of aortocoronary bypass graft: Secondary | ICD-10-CM | POA: Diagnosis not present

## 2015-12-04 NOTE — Patient Instructions (Signed)
Make every effort to stay physically active, get some type of exercise on a regular basis, and stick to a "heart healthy diet".  The long term benefits for regular exercise and a healthy diet are critically important to your overall health and wellbeing.  Make every effort to keep your diabetes under very tight control.  Follow up closely with your primary care physician or endocrinologist and strive to keep their hemoglobin A1c levels as low as possible, preferably near or below 6.0.  The long term benefits of strict control of diabetes are far reaching and critically important for your overall health and survival.

## 2015-12-04 NOTE — Progress Notes (Signed)
[address]       [place] [address]             [phone]     CARDIOTHORACIC SURGERY OFFICE NOTE  Referring Provider is Lauree Chandler D*MD PCP is [name], MD   HPI:  The patient returns to the office today for routine follow-up approximately one year status post coronary artery bypass grafting 4 for severe three-vessel coronary artery disease with unstable angina pectoris and ischemic cardiomyopathy. His postoperative recovery was uneventful and he was last seen here in our office on 01/16/2015 at which time he was doing well. Since then he has been followed carefully by Dr. Angelena Form and he has recently been seen in follow-up by Dr. Lovena Le for consultation regarding possible placement of ICD.  He returns to our office for routine follow-up. He states that he has been doing remarkably well. He denies any history of substernal chest discomfort either with activity or rest. He states that he only gets short of breath if he really pushes himself physically. He does not experience any exertional shortness of breath with normal activities. He is working full-time, and this requires some fairly strenuous activity at times. Overall he feels much improved in comparison with how he felt prior to surgery last year. He states that his diabetes has been under good control with recent hemoglobin A1c measured approximately 6.0. He states his last total cholesterol was measured approximately 100. He has been trying to exercise regularly although he admits that he has been inconsistent. He has been good about maintaining a careful diet.   Current Outpatient Prescriptions  Medication Sig Dispense Refill  . aspirin EC 325 MG EC tablet Take 1 tablet (325 mg total) by mouth daily. 30 tablet 0  . carvedilol (COREG) 25 MG tablet Take 1 tablet (25 mg total) by mouth 2 (two) times daily with a meal. 60 tablet 3  . fluticasone (FLONASE) 50 MCG/ACT nasal spray Place 1  spray into both nostrils daily as needed for allergies or rhinitis.    Marland Kitchen loratadine (CLARITIN) 10 MG tablet Take 10 mg by mouth daily.    . metFORMIN (GLUCOPHAGE) 1000 MG tablet Take 1,000-1,500 mg by mouth 2 (two) times daily with a meal. *TAKES 1000MG  IN THE MORNING AND 1500MG  IN THE EVENING*    . multivitamin (THERAGRAN) per tablet Take 1 tablet by mouth daily.      . nitroGLYCERIN (NITROSTAT) 0.4 MG SL tablet Place 0.4 mg under the tongue every 5 (five) minutes x 3 doses as needed for chest pain.    Marland Kitchen quinapril (ACCUPRIL) 40 MG tablet Take 2 tablets (80 mg total) by mouth daily. 60 tablet 3  . rosuvastatin (CRESTOR) 10 MG tablet Take 1 tablet (10 mg total) by mouth daily. 30 tablet 6  . spironolactone (ALDACTONE) 25 MG tablet TAKE ONE-HALF TABLET BY MOUTH ONCE DAILY 30 tablet 2   No current facility-administered medications for this visit.      Physical Exam:   BP 143/88 mmHg  Pulse 75  Resp 16  Ht 5\' 11"  (1.803 m)  Wt 211 lb (95.709 kg)  BMI 29.44 kg/m2  SpO2 97%  General:  Mildly obese but well-appearing  Chest:   Clear to auscultation  CV:   Regular rate and rhythm without murmur  Incisions:  Completely healed, sternum is stable  Abdomen:  Soft and nontender  Extremities:  Warm and well-perfused  Diagnostic Tests:  Transthoracic Echocardiography  Patient:  Oneida Alar,  Joshiah Schaal MR #:    [id number] Study Date: 11/24/2015 Gender:   M Age:    64 Height:   177.8 cm Weight:   96.2 kg BSA:    2.2 m^2 Pt. Status: Room:  ORDERING   Cristopher Peru, MD REFERRING  Cristopher Peru, MD SONOGRAPHER Oletta Lamas Will PERFORMING  [facility], Outpatient ATTENDING  Skeet Latch, MD  cc:  ------------------------------------------------------------------- LV EF: 25% -  30%  ------------------------------------------------------------------- Indications:   (I50.22).  ------------------------------------------------------------------- History:  PMH:  Acquired from the patient and from the patient&'s chart. Coronary artery disease. Congestive heart failure. Risk factors: Former tobacco use. Hypertension. Diabetes mellitus. Dyslipidemia.  ------------------------------------------------------------------- Study Conclusions  - Left ventricle: The cavity size was moderately dilated. There was mild focal basal hypertrophy of the septum. Systolic function was severely reduced. The estimated ejection fraction was in the range of 25% to 30%. Diffuse hypokinesis. Akinesis of the inferolateral myocardium. Doppler parameters are consistent with high ventricular filling pressure. - Mitral valve: There was mild regurgitation. - Left atrium: The atrium was severely dilated. - Right ventricle: The cavity size was normal. Wall thickness was normal. Systolic function was mildly reduced. - Tricuspid valve: There was trivial regurgitation. - Inferior vena cava: The vessel was normal in size. The respirophasic diameter changes were blunted (< 50%).  Transthoracic echocardiography. M-mode, complete 2D, spectral Doppler, and color Doppler. Birthdate: Patient birthdate: November 28, 1951. Age: Patient is 64 yr old. Sex: Gender: male. BMI: 30.4 kg/m^2. Blood pressure:   138/88 Patient status: Outpatient. Study date: Study date: 11/24/2015. Study time: 10:58 AM. Location: [facility]  -------------------------------------------------------------------  ------------------------------------------------------------------- Left ventricle: The cavity size was moderately dilated. There was mild focal basal hypertrophy of the septum. Systolic function was severely reduced. The estimated ejection fraction was in the range of 25% to 30%. Diffuse hypokinesis. Regional wall motion abnormalities:  Akinesis of the inferolateral myocardium. Doppler parameters are consistent with high ventricular filling  pressure.  ------------------------------------------------------------------- Aortic valve:  Trileaflet; normal thickness leaflets. Mobility was not restricted. Doppler: Transvalvular velocity was within the normal range. There was no stenosis. There was no regurgitation.  ------------------------------------------------------------------- Aorta: Aortic root: The aortic root was normal in size.  ------------------------------------------------------------------- Mitral valve:  Structurally normal valve.  Mobility was not restricted. Doppler: Transvalvular velocity was within the normal range. There was no evidence for stenosis. There was mild regurgitation.  ------------------------------------------------------------------- Left atrium: The atrium was severely dilated.  ------------------------------------------------------------------- Right ventricle: The cavity size was normal. Wall thickness was normal. Systolic function was mildly reduced.  ------------------------------------------------------------------- Pulmonic valve:  The valve appears to be grossly normal. Doppler: Transvalvular velocity was within the normal range. There was no evidence for stenosis.  ------------------------------------------------------------------- Tricuspid valve:  Structurally normal valve.  Doppler: Transvalvular velocity was within the normal range. There was trivial regurgitation.  ------------------------------------------------------------------- Pulmonary artery:  The main pulmonary artery was normal-sized. Systolic pressure was within the normal range.  ------------------------------------------------------------------- Right atrium: The atrium was normal in size.  ------------------------------------------------------------------- Pericardium: There was no pericardial effusion.  ------------------------------------------------------------------- Systemic  veins: Inferior vena cava: The vessel was normal in size. The respirophasic diameter changes were blunted (< 50%).  ------------------------------------------------------------------- Measurements  Left ventricle              Value    Reference LV ID, ED, PLAX chordal     (H)   55.1 mm   43 - 52 LV ID, ES, PLAX chordal     (H)   49.4 mm   23 - 38 LV fx shortening,  PLAX chordal  (L)   10  %   >=29 LV PW thickness, ED           8.33 mm   --------- IVS/LV PW ratio, ED       (H)   1.51     <=1.3 Stroke volume, 2D            50  ml   --------- Stroke volume/bsa, 2D          23  ml/m^2 --------- LV ejection fraction, 1-p A4C      29  %   --------- LV end-diastolic volume, 2-p       214  ml   --------- LV end-systolic volume, 2-p       146  ml   --------- LV ejection fraction, 2-p        32  %   --------- Stroke volume, 2-p            68  ml   --------- LV end-diastolic volume/bsa, 2-p     97  ml/m^2 --------- LV end-systolic volume/bsa, 2-p     66  ml/m^2 --------- Stroke volume/bsa, 2-p          30.8 ml/m^2 --------- LV e&', lateral              5.85 cm/s  --------- LV E/e&', lateral             10.72    --------- LV e&', medial              5.07 cm/s  --------- LV E/e&', medial             12.37    --------- LV e&', average              5.46 cm/s  --------- LV E/e&', average             11.48    ---------  Ventricular septum            Value    Reference IVS thickness, ED            12.6 mm   ---------  LVOT                   Value    Reference LVOT ID, S                22  mm   --------- LVOT area                 3.8  cm^2  --------- LVOT ID                 22  mm   --------- LVOT peak velocity, S          60.9 cm/s  --------- LVOT mean velocity, S          45.6 cm/s  --------- LVOT VTI, S               13.1 cm   --------- Stroke volume (SV), LVOT DP       49.8 ml   --------- Stroke index (SV/bsa), LVOT DP      22.6 ml/m^2 ---------  Aortic valve               Value    Reference Aortic regurg pressure half-time     629  ms   ---------  Aorta  Value    Reference Aortic root ID, ED            42  mm   --------- Ascending aorta ID, A-P, S        35  mm   ---------  Left atrium               Value    Reference LA ID, A-P, ES              41  mm   --------- LA ID/bsa, A-P              1.86 cm/m^2 <=2.2 LA volume, S               86  ml   --------- LA volume/bsa, S             39  ml/m^2 --------- LA volume, ES, 1-p A4C          78  ml   --------- LA volume/bsa, ES, 1-p A4C        35.4 ml/m^2 --------- LA volume, ES, 1-p A2C          88  ml   --------- LA volume/bsa, ES, 1-p A2C        39.9 ml/m^2 ---------  Mitral valve               Value    Reference Mitral E-wave peak velocity       62.7 cm/s  --------- Mitral A-wave peak velocity       92.8 cm/s  --------- Mitral deceleration time         229  ms   150 - 230 Mitral E/A ratio, peak          0.7     ---------  Pulmonary arteries            Value    Reference PA pressure, S, DP            28  mm Hg <=30  Tricuspid valve             Value    Reference Tricuspid regurg peak velocity      221  cm/s   --------- Tricuspid peak RV-RA gradient      20  mm Hg ---------  Systemic veins              Value    Reference Estimated CVP              8   mm Hg ---------  Right ventricle             Value    Reference RV pressure, S, DP            28  mm Hg <=30 RV s&', lateral, S            8.77 cm/s  ---------  Legend: (L) and (H) mark values outside specified reference range.  ------------------------------------------------------------------- Prepared and Electronically Authenticated by  Skeet Latch, MD 2016-12-02T12:09:03   Impression:  Patient is doing remarkably well proximally one year status post coronary artery bypass grafting. He denies any symptoms of exertional chest pain or chest tightness and his exercise tolerance is quite good. He states that he only gets short of breath with very strenuous activity and this does not limit his physical activities whatsoever.  Plan:  In the future the patient will call and return to see [place] as needed. We have not recommended any  changes to his current medications at this time. He has been reminded regarding the long-term benefits of regular exercise, heart healthy diet, and careful attention to management of this diabetes and hyperlipidemia.  I spent in excess of 15 minutes during the conduct of this office consultation and >50% of this time involved direct face-to-face encounter with the patient for counseling and/or coordination of their care.   Valentina Gu. Roxy Manns, MD 12/04/2015 2:49 PM

## 2016-01-09 ENCOUNTER — Encounter: Payer: Self-pay | Admitting: Gastroenterology

## 2016-01-11 MED FILL — CARVEDILOL 25 MG TABLET: 25 | 30 days supply | Qty: 60 | Fill #2

## 2016-01-11 MED FILL — ROSUVASTATIN CALCIUM 10 MG: 10 | 30 days supply | Qty: 30 | Fill #2

## 2016-01-11 MED FILL — QUINAPRIL 40 MG TABLET: 40 | 30 days supply | Qty: 60 | Fill #2

## 2016-02-02 ENCOUNTER — Encounter: Payer: Self-pay | Admitting: Cardiovascular Disease

## 2016-02-02 ENCOUNTER — Ambulatory Visit (INDEPENDENT_AMBULATORY_CARE_PROVIDER_SITE_OTHER): Payer: BLUE CROSS/BLUE SHIELD | Admitting: Cardiovascular Disease

## 2016-02-02 VITALS — BP 140/100 | HR 70 | Ht 71.0 in | Wt 209.6 lb

## 2016-02-02 DIAGNOSIS — I255 Ischemic cardiomyopathy: Secondary | ICD-10-CM

## 2016-02-02 DIAGNOSIS — E782 Mixed hyperlipidemia: Secondary | ICD-10-CM

## 2016-02-02 DIAGNOSIS — I10 Essential (primary) hypertension: Secondary | ICD-10-CM | POA: Diagnosis not present

## 2016-02-02 DIAGNOSIS — I2510 Atherosclerotic heart disease of native coronary artery without angina pectoris: Secondary | ICD-10-CM | POA: Diagnosis not present

## 2016-02-02 NOTE — Progress Notes (Signed)
Chief Complaint  Patient presents with  . Follow-up  . Coronary Artery Disease      History of Present Illness: 65 yo WM with history of CAD s/p CABG, HTN, HLD, DM here today for cardiac follow up. He has been followed in the past by Dr. Olevia Perches. 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 There was an unsuccessful attempt at PCI of a chronically occluded LAD. He was admitted to [facility] December 2015 with unstable angina and CHF. Cardiac cath demonstrated severe three vessel CAD. He underwent 4V CABG per Dr. Roxy Manns ( LIMA to LAD, SVG to RCA, SVG to OM, & SVG to Diagonal). LVEF noted to be 35% by cath.  Echo 02/24/15 with LVEF=20-25%. He has been seen by Dr. Lovena Le in the EP clinic to discuss ICD but he has continued to have class 1 symptoms. 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   He is here today for follow up. He denies chest pain, SOB or palpitations.  Still working full time without limitations.   Primary Care Physician: Claris Gower  Last Lipid Profile: Followed in primary care  Past Medical History  Diagnosis Date  . Coronary artery disease   . Hyperlipidemia, mixed   . Hypertension   . Hypothyroidism   . Kidney stones     "passed on my own" (27-Dec-2014)  . Anginal pain ([place])   . Type II diabetes mellitus ([place])   . 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 Fatty liver     Past Surgical History  Procedure Laterality Date  . Wisdom tooth extraction  1980's    'all 4"  . Coronary angioplasty with stent placement  12/2003    CFX  . Coronary angioplasty  01/2004    failed LAD PCI  . Cardiac catheterization  12-27-2014    "recommending about OHS"  . Left heart catheterization with coronary angiogram N/A 12-27-2014    Procedure: LEFT HEART CATHETERIZATION WITH CORONARY ANGIOGRAM;  Surgeon: Burnell Blanks, MD;  Location: [facility] CATH LAB;   Service: Cardiovascular;  Laterality: N/A;  . Coronary artery bypass graft N/A 12/09/2014    Procedure: CORONARY ARTERY BYPASS GRAFTING (CABG), ON PUMP, TIMES FOUR, USING LEFT INTERNAL MAMMARY ARTERY, RIGHT GREATER SAPHENOUS VEIN HARVESTED ENDOSCOPICALLY;  Surgeon: Rexene Alberts, MD;  Location: [facility];  Service: Open Heart Surgery;  Laterality: N/A;  . Intraoperative transesophageal echocardiogram N/A 12/09/2014    Procedure: INTRAOPERATIVE TRANSESOPHAGEAL ECHOCARDIOGRAM;  Surgeon: Rexene Alberts, MD;  Location: [place];  Service: Open Heart Surgery;  Laterality: N/A;    Current Outpatient Prescriptions  Medication Sig Dispense Refill  . aspirin EC 325 MG EC tablet Take 1 tablet (325 mg total) by mouth daily. 30 tablet 0  . carvedilol (COREG) 25 MG tablet Take 1 tablet (25 mg total) by mouth 2 (two) times daily with a meal. 60 tablet 3  . fluticasone (FLONASE) 50 MCG/ACT nasal spray Place 1 spray into both nostrils daily as needed for allergies or rhinitis.    Marland Kitchen loratadine (CLARITIN) 10 MG tablet Take 10 mg by mouth daily.    . metFORMIN (GLUCOPHAGE) 1000 MG tablet Take 1,000-1,500 mg by mouth 2 (two) times daily with a meal. *TAKES 1000MG  IN THE MORNING AND 1500MG  IN THE EVENING*    . multivitamin (THERAGRAN) per  tablet Take 1 tablet by mouth daily.      . nitroGLYCERIN (NITROSTAT) 0.4 MG SL tablet Place 0.4 mg under the tongue every 5 (five) minutes x 3 doses as needed for chest pain.    Marland Kitchen quinapril (ACCUPRIL) 40 MG tablet Take 2 tablets (80 mg total) by mouth daily. 60 tablet 3  . rosuvastatin (CRESTOR) 10 MG tablet Take 1 tablet (10 mg total) by mouth daily. 30 tablet 6  . spironolactone (ALDACTONE) 25 MG tablet TAKE ONE-HALF TABLET BY MOUTH ONCE DAILY 30 tablet 2   No current facility-administered medications for this visit.    No Known Allergies  Social History   Social History  . Marital Status: Single    Spouse Name: N/A  . Number of Children: 2  . Years of Education: N/A    Occupational History  . [employer] Other  .     Social History Main Topics  . Smoking status: Former Smoker -- 0.50 packs/day for 30 years    Types: Cigarettes  . Smokeless tobacco: Never Used     Comment: "quit smoking cigarettes in ~ 2007"  . Alcohol Use: No  . Drug Use: No  . Sexual Activity: Yes   Other Topics Concern  . Not on file   Social History Narrative    Family History  Problem Relation Age of Onset  . Coronary artery disease Father 33  . Hypertension Mother   . Heart attack Paternal Grandfather     x2 (MI at age 7 was his cause of death)    Review of Systems:  As stated in the HPI and otherwise negative.   BP 140/100 mmHg  Pulse 70  Ht 5\' 11"  (1.803 m)  Wt 209 lb 9.6 oz (95.074 kg)  BMI 29.25 kg/m2  SpO2 98%  Physical Examination: General: Well developed, well nourished, NAD HEENT: OP clear, mucus membranes moist SKIN: warm, dry. No rashes. Neuro: No focal deficits Musculoskeletal: Muscle strength 5/5 all ext Psychiatric: Mood and affect normal Neck: No JVD, no carotid bruits, no thyromegaly, no lymphadenopathy. Lungs:Clear bilaterally, no wheezes, rhonci, crackles Cardiovascular: Regular rate and rhythm. No murmurs, gallops or rubs. Abdomen:Soft. Bowel sounds present. Non-tender.  Extremities: No lower extremity edema. Pulses are 2 + in the bilateral DP/PT.  Echo 02/24/15: Left ventricle: The cavity size was moderately dilated. Systolic function was severely reduced. The estimated ejection fraction was in the range of 20% to 25%. Wall motion was normal; there were no regional wall motion abnormalities. Doppler parameters are consistent with a restrictive pattern, indicative of decreased left ventricular diastolic compliance and/or increased left atrial pressure (grade 3 diastolic dysfunction). Doppler parameters are consistent with elevated ventricular end-diastolic filling pressure. - Aortic valve: Trileaflet;  normal thickness leaflets. There was no regurgitation. - Aortic root: The aortic root was normal in size. - Mitral valve: Structurally normal valve. There was mild regurgitation. - Left atrium: The atrium was mildly dilated. - Right ventricle: Systolic function was mildly reduced. - Right atrium: The atrium was normal in size. - Tricuspid valve: Structurally normal valve. There was trivial regurgitation. - Pulmonic valve: There was no regurgitation. - Pulmonary arteries: Systolic pressure was within the normal range. - Inferior vena cava: The vessel was normal in size. - Pericardium, extracardiac: There was no pericardial effusion. Impressions: - There is no significant difference when compared to the study from 12/08/2014.  EKG:  EKG is not ordered today. The ekg ordered today demonstrates   Recent Labs: 02/10/2015: ALT 17  06/16/2015: BUN 13; Creatinine, Ser 0.95; Potassium 4.2; Sodium 138   Lipid Panel    Component Value Date/Time   CHOL 115 02/10/2015 0834   TRIG 123.0 02/10/2015 0834   HDL 26.90* 02/10/2015 0834   CHOLHDL 4 02/10/2015 0834   VLDL 24.6 02/10/2015 0834   LDLCALC 64 02/10/2015 0834     Wt Readings from Last 3 Encounters:  02/02/16 209 lb 9.6 oz (95.074 kg)  12/04/15 211 lb (95.709 kg)  12/01/15 211 lb 3.2 oz (95.8 kg)     Other studies Reviewed: Additional studies/ records that were reviewed today include: . Review of the above records demonstrates:    Assessment and Plan:   1. CAD: s/p CABG December 09, 2014. Doing well. He is on ASA, Coreg, Accupril and Crestor.  He has had no chest pain concerning for angina.   2. HYPERTENSION: BP has been well controlled at home. I have extensively reviewed his medications and indications today. No changes today.    3. Ischemic cardiomyopathy: LVEF=20-25% s/p CABG 12/09/14. He has been seen by Dr. Lovena Le and there has been a discussion regarding an ICD but he is still working with high voltage so ICD  is not recommended. He has class 1 symptoms.     4. Hyperlipidemia: Continue statin. LDL at goal.   Current medicines are reviewed at length with the patient today.  The patient does not have concerns regarding medicines.  The following changes have been made:  no change  Labs/ tests ordered today include:   No orders of the defined types were placed in this encounter.    Disposition:   FU with me in 6 months  Signed, Lauree Chandler, MD 02/02/2016 8:52 AM    [facility] [address], [place], [place]  [address] Phone: [phone]; Fax: [phone]

## 2016-02-02 NOTE — Patient Instructions (Signed)

## 2016-02-05 MED FILL — ROSUVASTATIN CALCIUM 10 MG: 10 | 30 days supply | Qty: 30 | Fill #3

## 2016-02-05 MED FILL — QUINAPRIL 40 MG TABLET: 40 | 30 days supply | Qty: 60 | Fill #3

## 2016-02-05 MED FILL — SPIRONOLACTONE 25 MG TABLET: 25 | 60 days supply | Qty: 30 | Fill #1

## 2016-02-05 MED FILL — CARVEDILOL 25 MG TABLET: 25 | 30 days supply | Qty: 60 | Fill #3

## 2016-03-01 ENCOUNTER — Other Ambulatory Visit: Payer: Self-pay

## 2016-03-01 ENCOUNTER — Encounter: Payer: Self-pay | Admitting: Gastroenterology

## 2016-03-01 ENCOUNTER — Ambulatory Visit (INDEPENDENT_AMBULATORY_CARE_PROVIDER_SITE_OTHER): Payer: BLUE CROSS/BLUE SHIELD | Admitting: Gastroenterology

## 2016-03-01 VITALS — BP 130/70 | HR 80 | Ht 71.0 in | Wt 212.0 lb

## 2016-03-01 DIAGNOSIS — I5022 Chronic systolic (congestive) heart failure: Secondary | ICD-10-CM | POA: Diagnosis not present

## 2016-03-01 DIAGNOSIS — R195 Other fecal abnormalities: Secondary | ICD-10-CM

## 2016-03-01 NOTE — Patient Instructions (Signed)
You have been scheduled for a colonoscopy. Please follow written instructions given to you at your visit today.  Please pick up your prep supplies at the pharmacy within the next 1-3 days. If you use inhalers (even only as needed), please bring them with you on the day of your procedure. Your physician has requested that you go to www.startemmi.com and enter the access code given to you at your visit today. This web site gives a general overview about your procedure. However, you should still follow specific instructions given to you by our office regarding your preparation for the procedure.  Thank you for choosing [facility]   Dr Henry Danis III  

## 2016-03-01 NOTE — Progress Notes (Signed)
[facility] Consult Note:  History: Zachary Ellis 03/01/2016  Referring physician: [name], MD  Reason for consult/chief complaint: Abnormal Lab   Subjective HPI:  Referred for positive routine "Hemosure" stool test by PCP. Denies abd pain, overt rectal bleeding, change bowel habits, or weight loss. Denies frequent heartburn, dysphagia, early satiety or weight loss. He has not had a prior colonoscopy. I reviewed last Cardiology note.  Has class 1 CHF , last known EF 20-25 % in 02/2015.  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   ROS:  Review of Systems  Denies chest pain or dyspnea Past Medical History: Past Medical History  Diagnosis Date  . Coronary artery disease   . Hyperlipidemia, mixed   . Hypertension   . Hypothyroidism   . Kidney stones     "passed on my own" (12-08-14)  . Anginal pain ([place])   . Type II diabetes mellitus ([place])   . 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 Fatty liver      Past Surgical History: Past Surgical History  Procedure Laterality Date  . Wisdom tooth extraction  1980's    'all 4"  . Coronary angioplasty with stent placement  12/2003    CFX  . Coronary angioplasty  01/2004    failed LAD PCI  . Cardiac catheterization  2014-12-08    "recommending about OHS"  . Left heart catheterization with coronary angiogram N/A 2014-12-08    Procedure: LEFT HEART CATHETERIZATION WITH CORONARY ANGIOGRAM;  Surgeon: Burnell Blanks, MD;  Location: [facility] CATH LAB;  Service: Cardiovascular;  Laterality: N/A;  . Coronary artery bypass graft N/A 12/09/2014    Procedure: CORONARY ARTERY BYPASS GRAFTING (CABG), ON PUMP, TIMES FOUR, USING LEFT INTERNAL MAMMARY ARTERY, RIGHT GREATER SAPHENOUS VEIN HARVESTED ENDOSCOPICALLY;  Surgeon: Rexene Alberts, MD;  Location: [place];  Service: Open Heart Surgery;  Laterality: N/A;  . Intraoperative  transesophageal echocardiogram N/A 12/09/2014    Procedure: INTRAOPERATIVE TRANSESOPHAGEAL ECHOCARDIOGRAM;  Surgeon: Rexene Alberts, MD;  Location: [place];  Service: Open Heart Surgery;  Laterality: N/A;     Family History: Family History  Problem Relation Age of Onset  . Coronary artery disease Father 54  . Hypertension Mother   . Heart attack Paternal Grandfather     x2 (MI at age 3 was his cause of death)  MGM possible stomach CA  Social History: Social History   Social History  . Marital Status: Single    Spouse Name: N/A  . Number of Children: 2  . Years of Education: N/A   Occupational History  . [employer] Other  .     Social History Main Topics  . Smoking status: Former Smoker -- 0.50 packs/day for 30 years    Types: Cigarettes  . Smokeless tobacco: Never Used     Comment: "quit smoking cigarettes in ~ 2007"  . Alcohol Use: No  . Drug Use: No  . Sexual Activity: Yes   Other Topics Concern  . None   Social History Narrative    Allergies: No Known Allergies  Outpatient Meds: Current Outpatient Prescriptions  Medication Sig Dispense Refill  . aspirin EC 325 MG EC tablet Take 1 tablet (325 mg total) by mouth daily. 30 tablet 0  . carvedilol (COREG) 25 MG tablet Take 1 tablet (25 mg total) by mouth 2 (two) times  daily with a meal. 60 tablet 3  . fluticasone (FLONASE) 50 MCG/ACT nasal spray Place 1 spray into both nostrils daily as needed for allergies or rhinitis.    Marland Kitchen loratadine (CLARITIN) 10 MG tablet Take 10 mg by mouth daily.    . metFORMIN (GLUCOPHAGE) 1000 MG tablet Take 1,000-1,500 mg by mouth 2 (two) times daily with a meal. *TAKES 1000MG  IN THE MORNING AND 1500MG  IN THE EVENING*    . multivitamin (THERAGRAN) per tablet Take 1 tablet by mouth daily.      . nitroGLYCERIN (NITROSTAT) 0.4 MG SL tablet Place 0.4 mg under the tongue every 5 (five) minutes x 3 doses as needed for chest pain.    Marland Kitchen quinapril (ACCUPRIL) 40 MG tablet Take 2  tablets (80 mg total) by mouth daily. 60 tablet 3  . rosuvastatin (CRESTOR) 10 MG tablet Take 1 tablet (10 mg total) by mouth daily. 30 tablet 6  . spironolactone (ALDACTONE) 25 MG tablet TAKE ONE-HALF TABLET BY MOUTH ONCE DAILY 30 tablet 2   No current facility-administered medications for this visit.      ___________________________________________________________________ Objective  Exam:  BP 130/70 mmHg  Pulse 80  Ht 5\' 11"  (1.803 m)  Wt 212 lb (96.163 kg)  BMI 29.58 kg/m2   General: this is a well appearing, overweight white male, good muscle mass   Eyes: sclera anicteric, no redness  ENT: oral mucosa moist without lesions, no cervical or supraclavicular lymphadenopathy, good dentition  CV: RRR without murmur, S1/S2, no JVD, no peripheral edema  Resp: clear to auscultation bilaterally, normal RR and effort noted  GI: soft, no tenderness, with active bowel sounds. No guarding or palpable organomegaly noted.  Skin; warm and dry, no rash or jaundice noted. No pallor.  Neuro: awake, alert and oriented x 3. Normal gross motor function and fluent speech  No recent hemoglobin available  Assessment: Encounter Diagnoses  Name Primary?  . Heme positive stool Yes  . Chronic systolic heart failure ([place])       Plan:  Colonoscopy in hospital outpatient endoscopy dept due to low EF The benefits and risks of the planned procedure were described in detail with the patient or (when appropriate) their health care proxy.  Risks were outlined as including, but not limited to, bleeding, infection, perforation, adverse medication reaction leading to cardiac or pulmonary decompensation, or pancreatitis (if ERCP).  The limitation of incomplete mucosal visualization was also discussed.  No guarantees or warranties were given.   Thank you for the courtesy of this consult.  Please call me with any questions or concerns.  Nelida Meuse III

## 2016-03-11 ENCOUNTER — Other Ambulatory Visit: Payer: Self-pay | Admitting: Cardiovascular Disease

## 2016-03-11 MED FILL — QUINAPRIL 40 MG TABLET: 40 | 30 days supply | Qty: 60 | Fill #0

## 2016-03-11 MED FILL — CARVEDILOL 25 MG TABLET: 25 | 30 days supply | Qty: 60 | Fill #0

## 2016-03-11 MED FILL — ROSUVASTATIN CALCIUM 10 MG: 10 | 30 days supply | Qty: 30 | Fill #4

## 2016-03-22 ENCOUNTER — Encounter (HOSPITAL_COMMUNITY): Payer: Self-pay | Admitting: *Deleted

## 2016-03-29 ENCOUNTER — Ambulatory Visit (HOSPITAL_COMMUNITY): Payer: BLUE CROSS/BLUE SHIELD | Admitting: Anesthesiology

## 2016-03-29 ENCOUNTER — Ambulatory Visit (HOSPITAL_COMMUNITY)
Admission: RE | Admit: 2016-03-29 | Discharge: 2016-03-29 | Disposition: A | Discharge status: Home / Self Care | Payer: BLUE CROSS/BLUE SHIELD | Source: Ambulatory Visit | Attending: Gastroenterology | Admitting: Gastroenterology

## 2016-03-29 ENCOUNTER — Encounter (HOSPITAL_COMMUNITY): Payer: Self-pay

## 2016-03-29 ENCOUNTER — Encounter (HOSPITAL_COMMUNITY)
Admission: RE | Disposition: A | Discharge status: Home / Self Care | Payer: Self-pay | Source: Ambulatory Visit | Attending: Gastroenterology

## 2016-03-29 DIAGNOSIS — I2510 Atherosclerotic heart disease of native coronary artery without angina pectoris: Secondary | ICD-10-CM | POA: Insufficient documentation

## 2016-03-29 DIAGNOSIS — I5022 Chronic systolic (congestive) heart failure: Secondary | ICD-10-CM | POA: Insufficient documentation

## 2016-03-29 DIAGNOSIS — Z7982 Long term (current) use of aspirin: Secondary | ICD-10-CM | POA: Insufficient documentation

## 2016-03-29 DIAGNOSIS — E119 Type 2 diabetes mellitus without complications: Secondary | ICD-10-CM | POA: Insufficient documentation

## 2016-03-29 DIAGNOSIS — Z951 Presence of aortocoronary bypass graft: Secondary | ICD-10-CM | POA: Insufficient documentation

## 2016-03-29 DIAGNOSIS — K621 Rectal polyp: Secondary | ICD-10-CM | POA: Insufficient documentation

## 2016-03-29 DIAGNOSIS — R195 Other fecal abnormalities: Secondary | ICD-10-CM | POA: Diagnosis not present

## 2016-03-29 DIAGNOSIS — I110 Hypertensive heart disease with heart failure: Secondary | ICD-10-CM | POA: Insufficient documentation

## 2016-03-29 DIAGNOSIS — D125 Benign neoplasm of sigmoid colon: Secondary | ICD-10-CM | POA: Diagnosis not present

## 2016-03-29 DIAGNOSIS — Z7984 Long term (current) use of oral hypoglycemic drugs: Secondary | ICD-10-CM | POA: Insufficient documentation

## 2016-03-29 DIAGNOSIS — E782 Mixed hyperlipidemia: Secondary | ICD-10-CM | POA: Diagnosis not present

## 2016-03-29 DIAGNOSIS — Z87891 Personal history of nicotine dependence: Secondary | ICD-10-CM | POA: Diagnosis not present

## 2016-03-29 DIAGNOSIS — E039 Hypothyroidism, unspecified: Secondary | ICD-10-CM | POA: Insufficient documentation

## 2016-03-29 DIAGNOSIS — D123 Benign neoplasm of transverse colon: Secondary | ICD-10-CM | POA: Diagnosis not present

## 2016-03-29 DIAGNOSIS — Z79899 Other long term (current) drug therapy: Secondary | ICD-10-CM | POA: Insufficient documentation

## 2016-03-29 DIAGNOSIS — Z955 Presence of coronary angioplasty implant and graft: Secondary | ICD-10-CM | POA: Diagnosis not present

## 2016-03-29 DIAGNOSIS — D128 Benign neoplasm of rectum: Secondary | ICD-10-CM | POA: Diagnosis not present

## 2016-03-29 HISTORY — PX: COLONOSCOPY WITH PROPOFOL: SHX5780

## 2016-03-29 LAB — GLUCOSE, CAPILLARY: Glucose-Capillary: 112 mg/dL — ABNORMAL HIGH (ref 65–99)

## 2016-03-29 SURGERY — COLONOSCOPY WITH PROPOFOL
Anesthesia: Monitor Anesthesia Care

## 2016-03-29 MED ORDER — SODIUM CHLORIDE 0.9 % IV SOLN: INTRAVENOUS | Status: DC

## 2016-03-29 MED ORDER — LACTATED RINGERS IV SOLN
INTRAVENOUS | Status: DC
  Administered 2016-03-29: 1000 mL via INTRAVENOUS

## 2016-03-29 MED ORDER — PROPOFOL 10 MG/ML IV BOLUS
INTRAVENOUS | Status: AC
  Filled 2016-03-29: qty 40

## 2016-03-29 MED ORDER — PROPOFOL 10 MG/ML IV BOLUS
INTRAVENOUS | Status: DC | PRN
  Administered 2016-03-29: 50 mg via INTRAVENOUS
  Administered 2016-03-29: 20 mg via INTRAVENOUS

## 2016-03-29 MED ORDER — PROPOFOL 500 MG/50ML IV EMUL
INTRAVENOUS | Status: DC | PRN
  Administered 2016-03-29: 75 ug/kg/min via INTRAVENOUS

## 2016-03-29 NOTE — Transfer of Care (Signed)
Immediate Anesthesia Transfer of Care Note  Patient: Zachary Ellis  Procedure(s) Performed: Procedure(s): COLONOSCOPY WITH PROPOFOL (N/A)  Patient Location: PACU  Anesthesia Type:MAC  Level of Consciousness:  sedated, patient cooperative and responds to stimulation  Airway & Oxygen Therapy:Patient Spontanous Breathing and Patient connected to face mask oxgen  Post-op Assessment:  Report given to PACU RN and Post -op Vital signs reviewed and stable  Post vital signs:  Reviewed and stable  Last Vitals:  Filed Vitals:   03/29/16 0646 03/29/16 0839  BP: 154/87 122/71  Pulse: 80 74  Temp: 36.7 C 36.4 C  Resp: 14 14    Complications: No apparent anesthesia complications

## 2016-03-29 NOTE — Discharge Instructions (Addendum)
Colonoscopy, Care After These instructions give you information on caring for yourself after your procedure. Your doctor may also give you more specific instructions. Call your doctor if you have any problems or questions after your procedure. HOME CARE  Do not drive for 24 hours.  Do not sign important papers or use machinery for 24 hours.  You may shower.  You may go back to your usual activities, but go slower for the first 24 hours.  Take rest breaks often during the first 24 hours.  Walk around or use warm packs on your belly (abdomen) if you have belly cramping or gas.  Drink enough fluids to keep your pee (urine) clear or pale yellow.  Resume your normal diet. Avoid heavy or fried foods.  Avoid drinking alcohol for 24 hours or as told by your doctor.  Only take medicines as told by your doctor. If a tissue sample (biopsy) was taken during the procedure:   Do not take aspirin or blood thinners for 7 days, or as told by your doctor.  Do not drink alcohol for 7 days, or as told by your doctor.  Eat soft foods for the first 24 hours. GET HELP IF: You still have a small amount of blood in your poop (stool) 2-3 days after the procedure. GET HELP RIGHT AWAY IF:  You have more than a small amount of blood in your poop.  You see clumps of tissue (blood clots) in your poop.  Your belly is puffy (swollen).  You feel sick to your stomach (nauseous) or throw up (vomit).  You have a fever.  You have belly pain that gets worse and medicine does not help. MAKE SURE YOU:  Understand these instructions.  Will watch your condition.  Will get help right away if you are not doing well or get worse.   This information is not intended to replace advice given to you by your health care provider. Make sure you discuss any questions you have with your health care provider.   Document Released: 01/11/2011 Document Revised: 12/14/2013 Document Reviewed: 08/16/2013 Elsevier  Interactive Patient Education [employer].  Please do not take your aspirin for 7 days.

## 2016-03-29 NOTE — Interval H&P Note (Signed)
History and Physical Interval Note:  03/29/2016 7:54 AM  Domingo Sep  has presented today for surgery, with the diagnosis of Heme positive stool tt  The various methods of treatment have been discussed with the patient and family. After consideration of risks, benefits and other options for treatment, the patient has consented to  Procedure(s): COLONOSCOPY WITH PROPOFOL (N/A) as a surgical intervention .  The patient's history has been reviewed, patient examined, no change in status, stable for surgery.  I have reviewed the patient's chart and labs.  Questions were answered to the patient's satisfaction.     Nelida Meuse III

## 2016-03-29 NOTE — Anesthesia Postprocedure Evaluation (Signed)
Anesthesia Post Note  Patient: BLADYN ELKIND  Procedure(s) Performed: Procedure(s) (LRB): COLONOSCOPY WITH PROPOFOL (N/A)  Patient location during evaluation: PACU Anesthesia Type: MAC Level of consciousness: awake and alert and oriented Pain management: pain level controlled Vital Signs Assessment: post-procedure vital signs reviewed and stable Respiratory status: spontaneous breathing, nonlabored ventilation and respiratory function stable Cardiovascular status: blood pressure returned to baseline and stable Postop Assessment: no signs of nausea or vomiting Anesthetic complications: no    Last Vitals:  Filed Vitals:   03/29/16 0646 03/29/16 0839  BP: 154/87 122/71  Pulse: 80 74  Temp: 36.7 C 36.4 C  Resp: 14 14    Last Pain: There were no vitals filed for this visit.               Tawyna Pellot A.

## 2016-03-29 NOTE — Anesthesia Preprocedure Evaluation (Addendum)
Anesthesia Evaluation  Patient identified by MRN, date of birth, ID band Patient awake    Reviewed: Allergy & Precautions, NPO status , Patient's Chart, lab work & pertinent test results, reviewed documented beta blocker date and time   Airway Mallampati: II  TM Distance: >3 FB Neck ROM: Full    Dental  (+) Upper Dentures, Missing   Pulmonary former smoker,    Pulmonary exam normal breath sounds clear to auscultation       Cardiovascular hypertension, Pt. on medications and Pt. on home beta blockers + angina with exertion + CAD and + CABG  Normal cardiovascular exam Rhythm:Regular Rate:Normal  LVEF 25-30% Echo 11/2015 Left ventricle: The cavity size was moderately dilated. There was mild focal basal hypertrophy of the septum. Systolic function was severely reduced. The estimated ejection fraction was in the range of 25% to 30%. Diffuse hypokinesis. Akinesis of the inferolateral myocardium. Doppler parameters are consistent with high ventricular filling pressure.   Neuro/Psych negative neurological ROS  negative psych ROS   GI/Hepatic Neg liver ROS, Heme + stools   Endo/Other  diabetes, Well Controlled, Type 2, Oral Hypoglycemic AgentsHypothyroidism   Renal/GU Renal diseaseHx/o Renal Calculi  negative genitourinary   Musculoskeletal negative musculoskeletal ROS (+)   Abdominal   Peds  Hematology   Anesthesia Other Findings   Reproductive/Obstetrics                           Anesthesia Physical Anesthesia Plan  ASA: III  Anesthesia Plan: MAC   Post-op Pain Management:    Induction: Intravenous  Airway Management Planned: Nasal Cannula  Additional Equipment:   Intra-op Plan:   Post-operative Plan:   Informed Consent: I have reviewed the patients History and Physical, chart, labs and discussed the procedure including the risks, benefits and alternatives for the proposed anesthesia  with the patient or authorized representative who has indicated his/her understanding and acceptance.     Plan Discussed with: CRNA, Anesthesiologist and Surgeon  Anesthesia Plan Comments:         Anesthesia Quick Evaluation

## 2016-03-29 NOTE — Op Note (Signed)
[facility] Patient Name: Zachary Ellis Procedure Date: 03/29/2016 MRN: [id number] Attending MD: Estill Cotta. Loletha Carrow , MD Date of Birth: 11/22/1951 CSN:  Age: 65 Admit Type: Outpatient Procedure:                Colonoscopy Indications:              Heme positive stool Providers:                Mallie Mussel L. Loletha Carrow, MD, Carolynn Comment, RN, Elspeth Cho, Technician Referring MD:              Medicines:                Monitored Anesthesia Care Complications:            No immediate complications. Estimated Blood Loss:     Estimated blood loss: none. Procedure:                Pre-Anesthesia Assessment:                           - Prior to the procedure, a History and Physical                            was performed, and patient medications and                            allergies were reviewed. The patient's tolerance of                            previous anesthesia was also reviewed. The risks                            and benefits of the procedure and the sedation                            options and risks were discussed with the patient.                            All questions were answered, and informed consent                            was obtained. Prior Anticoagulants: The patient has                            taken aspirin, last dose was 5 days prior to                            procedure. ASA Grade Assessment: III - A patient                            with severe systemic disease. After reviewing the  risks and benefits, the patient was deemed in                            satisfactory condition to undergo the procedure.                           After obtaining informed consent, the colonoscope                            was passed under direct vision. Throughout the                            procedure, the patient's blood pressure, pulse, and                            oxygen saturations were monitored  continuously. The                            [id number]) scope was introduced through                            the anus and advanced to the the cecum, identified                            by appendiceal orifice and ileocecal valve. The                            colonoscopy was performed without difficulty. The                            patient tolerated the procedure well. The quality                            of the bowel preparation was fair. The ileocecal                            valve, appendiceal orifice, and rectum were                            photographed. Scope In: 8:08:28 AM Scope Out: 8:32:51 AM Scope Withdrawal Time: 0 hours 19 minutes 10 seconds  Total Procedure Duration: 0 hours 24 minutes 23 seconds  Findings:      The perianal and digital rectal examinations were normal.      Four semi-pedunculated polyps were found in the rectum (benign-appearing       lesion), proximal sigmoid colon and mid transverse colon. The polyps       were 4 to 10 mm in size. These polyps were removed with a hot snare.       Resection and retrieval were complete.      The exam was otherwise without abnormality on direct and retroflexion       views. Impression:               - Preparation of the colon was fair.                           -  Four benign appearing 4 to 10 mm polyps in the                            rectum (2), in the proximal sigmoid colon(1) and in                            the mid transverse colon(1), removed with a hot                            snare. Resected and retrieved.                           - The examination was otherwise normal on direct                            and retroflexion views. Moderate Sedation:      N/A- Per Anesthesia Care Recommendation:           - Patient has a contact number available for                            emergencies. The signs and symptoms of potential                            delayed complications were discussed with  the                            patient. Return to normal activities tomorrow.                            Written discharge instructions were provided to the                            patient.                           - Resume previous diet.                           - No aspirin, ibuprofen, naproxen, or other                            non-steroidal anti-inflammatory drugs for 7 days                            after polyp removal.                           - Await pathology results.                           - Repeat colonoscopy is recommended for                            surveillance. The colonoscopy date will be  determined after pathology results from today's                            exam become available for review. Procedure Code(s):        --- Professional ---                           [phone], Colonoscopy, flexible; with removal of                            tumor(s), polyp(s), or other lesion(s) by snare                            technique Diagnosis Code(s):        --- Professional ---                           K62.1, Rectal polyp                           D12.5, Benign neoplasm of sigmoid colon                           D12.3, Benign neoplasm of transverse colon (hepatic                            flexure or splenic flexure)                           R19.5, Other fecal abnormalities CPT copyright 2016 American Medical Association. All rights reserved. The codes documented in this report are preliminary and upon coder review may  be revised to meet current compliance requirements. Henry L. Loletha Carrow, MD 03/29/2016 8:54:25 AM This report has been signed electronically. Number of Addenda: 0

## 2016-03-29 NOTE — H&P (View-Only) (Signed)
[facility] Consult Note:  History: Zachary Ellis 03/01/2016  Referring physician: [name], MD  Reason for consult/chief complaint: Abnormal Lab   Subjective HPI:  Referred for positive routine "Hemosure" stool test by PCP. Denies abd pain, overt rectal bleeding, change bowel habits, or weight loss. Denies frequent heartburn, dysphagia, early satiety or weight loss. He has not had a prior colonoscopy. I reviewed last Cardiology note.  Has class 1 CHF , last known EF 20-25 % in 02/2015.  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   ROS:  Review of Systems  Denies chest pain or dyspnea Past Medical History: Past Medical History  Diagnosis Date  . Coronary artery disease   . Hyperlipidemia, mixed   . Hypertension   . Hypothyroidism   . Kidney stones     "passed on my own" (January 06, 2015)  . Anginal pain ([place])   . Type II diabetes mellitus ([place])   . 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 Fatty liver      Past Surgical History: Past Surgical History  Procedure Laterality Date  . Wisdom tooth extraction  1980's    'all 4"  . Coronary angioplasty with stent placement  12/2003    CFX  . Coronary angioplasty  01/2004    failed LAD PCI  . Cardiac catheterization  01-06-15    "recommending about OHS"  . Left heart catheterization with coronary angiogram N/A 01-06-2015    Procedure: LEFT HEART CATHETERIZATION WITH CORONARY ANGIOGRAM;  Surgeon: Burnell Blanks, MD;  Location: [facility] CATH LAB;  Service: Cardiovascular;  Laterality: N/A;  . Coronary artery bypass graft N/A 12/09/2014    Procedure: CORONARY ARTERY BYPASS GRAFTING (CABG), ON PUMP, TIMES FOUR, USING LEFT INTERNAL MAMMARY ARTERY, RIGHT GREATER SAPHENOUS VEIN HARVESTED ENDOSCOPICALLY;  Surgeon: Rexene Alberts, MD;  Location: [place];  Service: Open Heart Surgery;  Laterality: N/A;  . Intraoperative  transesophageal echocardiogram N/A 12/09/2014    Procedure: INTRAOPERATIVE TRANSESOPHAGEAL ECHOCARDIOGRAM;  Surgeon: Rexene Alberts, MD;  Location: [place];  Service: Open Heart Surgery;  Laterality: N/A;     Family History: Family History  Problem Relation Age of Onset  . Coronary artery disease Father 75  . Hypertension Mother   . Heart attack Paternal Grandfather     x2 (MI at age 15 was his cause of death)  MGM possible stomach CA  Social History: Social History   Social History  . Marital Status: Single    Spouse Name: N/A  . Number of Children: 2  . Years of Education: N/A   Occupational History  . [employer] Other  .     Social History Main Topics  . Smoking status: Former Smoker -- 0.50 packs/day for 30 years    Types: Cigarettes  . Smokeless tobacco: Never Used     Comment: "quit smoking cigarettes in ~ 2007"  . Alcohol Use: No  . Drug Use: No  . Sexual Activity: Yes   Other Topics Concern  . None   Social History Narrative    Allergies: No Known Allergies  Outpatient Meds: Current Outpatient Prescriptions  Medication Sig Dispense Refill  . aspirin EC 325 MG EC tablet Take 1 tablet (325 mg total) by mouth daily. 30 tablet 0  . carvedilol (COREG) 25 MG tablet Take 1 tablet (25 mg total) by mouth 2 (two) times  daily with a meal. 60 tablet 3  . fluticasone (FLONASE) 50 MCG/ACT nasal spray Place 1 spray into both nostrils daily as needed for allergies or rhinitis.    Marland Kitchen loratadine (CLARITIN) 10 MG tablet Take 10 mg by mouth daily.    . metFORMIN (GLUCOPHAGE) 1000 MG tablet Take 1,000-1,500 mg by mouth 2 (two) times daily with a meal. *TAKES 1000MG  IN THE MORNING AND 1500MG  IN THE EVENING*    . multivitamin (THERAGRAN) per tablet Take 1 tablet by mouth daily.      . nitroGLYCERIN (NITROSTAT) 0.4 MG SL tablet Place 0.4 mg under the tongue every 5 (five) minutes x 3 doses as needed for chest pain.    Marland Kitchen quinapril (ACCUPRIL) 40 MG tablet Take 2  tablets (80 mg total) by mouth daily. 60 tablet 3  . rosuvastatin (CRESTOR) 10 MG tablet Take 1 tablet (10 mg total) by mouth daily. 30 tablet 6  . spironolactone (ALDACTONE) 25 MG tablet TAKE ONE-HALF TABLET BY MOUTH ONCE DAILY 30 tablet 2   No current facility-administered medications for this visit.      ___________________________________________________________________ Objective  Exam:  BP 130/70 mmHg  Pulse 80  Ht 5\' 11"  (1.803 m)  Wt 212 lb (96.163 kg)  BMI 29.58 kg/m2   General: this is a well appearing, overweight white male, good muscle mass   Eyes: sclera anicteric, no redness  ENT: oral mucosa moist without lesions, no cervical or supraclavicular lymphadenopathy, good dentition  CV: RRR without murmur, S1/S2, no JVD, no peripheral edema  Resp: clear to auscultation bilaterally, normal RR and effort noted  GI: soft, no tenderness, with active bowel sounds. No guarding or palpable organomegaly noted.  Skin; warm and dry, no rash or jaundice noted. No pallor.  Neuro: awake, alert and oriented x 3. Normal gross motor function and fluent speech  No recent hemoglobin available  Assessment: Encounter Diagnoses  Name Primary?  . Heme positive stool Yes  . Chronic systolic heart failure ([place])       Plan:  Colonoscopy in hospital outpatient endoscopy dept due to low EF The benefits and risks of the planned procedure were described in detail with the patient or (when appropriate) their health care proxy.  Risks were outlined as including, but not limited to, bleeding, infection, perforation, adverse medication reaction leading to cardiac or pulmonary decompensation, or pancreatitis (if ERCP).  The limitation of incomplete mucosal visualization was also discussed.  No guarantees or warranties were given.   Thank you for the courtesy of this consult.  Please call me with any questions or concerns.  Nelida Meuse III

## 2016-04-02 ENCOUNTER — Encounter (HOSPITAL_COMMUNITY): Payer: Self-pay | Admitting: Gastroenterology

## 2016-04-02 ENCOUNTER — Encounter: Payer: Self-pay | Admitting: Gastroenterology

## 2016-04-04 ENCOUNTER — Other Ambulatory Visit: Payer: Self-pay | Admitting: Cardiovascular Disease

## 2016-04-04 MED FILL — SPIRONOLACTONE 25 MG TABLET: 25 | 60 days supply | Qty: 30 | Fill #0

## 2016-04-09 MED FILL — CARVEDILOL 25 MG TABLET: 25 | 30 days supply | Qty: 60 | Fill #1

## 2016-04-09 MED FILL — ROSUVASTATIN CALCIUM 10 MG: 10 | 30 days supply | Qty: 30 | Fill #5

## 2016-04-09 MED FILL — QUINAPRIL 40 MG TABLET: 40 | 30 days supply | Qty: 60 | Fill #1

## 2016-05-13 MED FILL — QUINAPRIL 40 MG TABLET: 40 | 30 days supply | Qty: 60 | Fill #2

## 2016-05-13 MED FILL — CARVEDILOL 25 MG TABLET: 25 | 30 days supply | Qty: 60 | Fill #2

## 2016-05-13 MED FILL — ROSUVASTATIN CALCIUM 10 MG: 10 | 30 days supply | Qty: 30 | Fill #6

## 2016-06-10 ENCOUNTER — Other Ambulatory Visit: Payer: Self-pay | Admitting: Internal Medicine

## 2016-06-10 MED FILL — CARVEDILOL 25 MG TABLET: 25 | 30 days supply | Qty: 60 | Fill #3

## 2016-06-10 MED FILL — SPIRONOLACTONE 25 MG TABLET: 25 | 60 days supply | Qty: 30 | Fill #1

## 2016-06-10 MED FILL — QUINAPRIL 40 MG TABLET: 40 | 30 days supply | Qty: 60 | Fill #3

## 2016-06-11 MED FILL — ROSUVASTATIN CALCIUM 10 MG: 10 | 30 days supply | Qty: 30 | Fill #0

## 2016-07-02 MED FILL — KETOCONAZOLE 2% CREAM: 2 | 14 days supply | Qty: 60 | Fill #0

## 2016-07-11 MED FILL — QUINAPRIL 40 MG TABLET: 40 | 30 days supply | Qty: 60 | Fill #4

## 2016-07-15 MED FILL — ROSUVASTATIN CALCIUM 10 MG: 10 | 30 days supply | Qty: 30 | Fill #1

## 2016-07-15 MED FILL — CARVEDILOL 25 MG TABLET: 25 | 30 days supply | Qty: 60 | Fill #4

## 2016-07-24 MED FILL — metFORMIN HCL 1000 MG TABS: 1000 | 90 days supply | Qty: 225 | Fill #0

## 2016-08-12 ENCOUNTER — Other Ambulatory Visit: Payer: Self-pay | Admitting: Cardiovascular Disease

## 2016-08-12 MED FILL — CARVEDILOL 25 MG TABLET: 25 | 30 days supply | Qty: 60 | Fill #5

## 2016-08-12 MED FILL — ROSUVASTATIN CALCIUM 10 MG: 10 | 30 days supply | Qty: 30 | Fill #2

## 2016-08-12 MED FILL — QUINAPRIL 40 MG TABLET: 40 | 30 days supply | Qty: 60 | Fill #5

## 2016-08-13 MED FILL — SPIRONOLACTONE 25 MG TABLET: 25 | 60 days supply | Qty: 30 | Fill #0

## 2016-09-10 MED FILL — CARVEDILOL 25 MG TABLET: 25 | 30 days supply | Qty: 60 | Fill #6

## 2016-09-10 MED FILL — QUINAPRIL 40 MG TABLET: 40 | 30 days supply | Qty: 60 | Fill #6

## 2016-09-23 ENCOUNTER — Other Ambulatory Visit: Payer: Self-pay | Admitting: Family Medicine

## 2016-09-23 DIAGNOSIS — I350 Nonrheumatic aortic (valve) stenosis: Secondary | ICD-10-CM

## 2016-09-23 MED FILL — ROSUVASTATIN CALCIUM 10 MG: 10 | 30 days supply | Qty: 30 | Fill #3

## 2016-10-03 ENCOUNTER — Other Ambulatory Visit (HOSPITAL_COMMUNITY): Payer: BLUE CROSS/BLUE SHIELD

## 2016-10-04 ENCOUNTER — Ambulatory Visit (HOSPITAL_COMMUNITY): Payer: BLUE CROSS/BLUE SHIELD | Attending: Cardiology

## 2016-10-04 ENCOUNTER — Other Ambulatory Visit: Payer: Self-pay

## 2016-10-04 DIAGNOSIS — I255 Ischemic cardiomyopathy: Secondary | ICD-10-CM | POA: Diagnosis not present

## 2016-10-04 DIAGNOSIS — Z951 Presence of aortocoronary bypass graft: Secondary | ICD-10-CM | POA: Diagnosis not present

## 2016-10-04 DIAGNOSIS — I2510 Atherosclerotic heart disease of native coronary artery without angina pectoris: Secondary | ICD-10-CM | POA: Insufficient documentation

## 2016-10-04 DIAGNOSIS — Z955 Presence of coronary angioplasty implant and graft: Secondary | ICD-10-CM | POA: Diagnosis not present

## 2016-10-04 DIAGNOSIS — I350 Nonrheumatic aortic (valve) stenosis: Secondary | ICD-10-CM

## 2016-10-04 DIAGNOSIS — I10 Essential (primary) hypertension: Secondary | ICD-10-CM | POA: Diagnosis not present

## 2016-10-04 DIAGNOSIS — E119 Type 2 diabetes mellitus without complications: Secondary | ICD-10-CM | POA: Insufficient documentation

## 2016-10-04 DIAGNOSIS — Z87891 Personal history of nicotine dependence: Secondary | ICD-10-CM | POA: Diagnosis not present

## 2016-10-04 DIAGNOSIS — E785 Hyperlipidemia, unspecified: Secondary | ICD-10-CM | POA: Insufficient documentation

## 2016-10-04 LAB — ECHOCARDIOGRAM COMPLETE

## 2016-10-04 MED ORDER — PERFLUTREN LIPID MICROSPHERE
1.0000 mL | INTRAVENOUS | Status: AC | PRN
  Administered 2016-10-04: 2 mL via INTRAVENOUS

## 2016-10-11 ENCOUNTER — Other Ambulatory Visit: Payer: Self-pay | Admitting: Cardiovascular Disease

## 2016-10-11 MED FILL — QUINAPRIL 40 MG TABLET: 40 | 30 days supply | Qty: 60 | Fill #0

## 2016-10-11 MED FILL — SPIRONOLACTONE 25 MG TABLET: 25 | 60 days supply | Qty: 30 | Fill #1

## 2016-10-18 ENCOUNTER — Encounter: Payer: Self-pay | Admitting: Cardiovascular Disease

## 2016-10-18 ENCOUNTER — Other Ambulatory Visit: Payer: Self-pay | Admitting: Cardiovascular Disease

## 2016-10-18 ENCOUNTER — Ambulatory Visit (INDEPENDENT_AMBULATORY_CARE_PROVIDER_SITE_OTHER): Payer: BLUE CROSS/BLUE SHIELD | Admitting: Cardiovascular Disease

## 2016-10-18 VITALS — BP 156/100 | HR 77 | Ht 70.0 in | Wt 217.6 lb

## 2016-10-18 DIAGNOSIS — I2510 Atherosclerotic heart disease of native coronary artery without angina pectoris: Secondary | ICD-10-CM

## 2016-10-18 DIAGNOSIS — E782 Mixed hyperlipidemia: Secondary | ICD-10-CM

## 2016-10-18 DIAGNOSIS — I255 Ischemic cardiomyopathy: Secondary | ICD-10-CM | POA: Diagnosis not present

## 2016-10-18 DIAGNOSIS — I10 Essential (primary) hypertension: Secondary | ICD-10-CM

## 2016-10-18 MED FILL — ROSUVASTATIN CALCIUM 10 MG: 10 | 30 days supply | Qty: 30 | Fill #4

## 2016-10-18 MED FILL — CARVEDILOL 25 MG TABLET: 25 | 30 days supply | Qty: 60 | Fill #0

## 2016-10-18 NOTE — Progress Notes (Signed)
Chief Complaint  Patient presents with  . Follow-up    pt c/o no chest pain      History of Present Illness: 65 yo WM with history of CAD s/p CABG, HTN, HLD, DM here today for cardiac follow up. 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 There was an unsuccessful attempt at PCI of a chronically occluded LAD. He was admitted to [facility] December 2015 with unstable angina and CHF. Cardiac cath demonstrated severe three vessel CAD. He underwent 4V CABG per Dr. Roxy Manns ( LIMA to LAD, SVG to RCA, SVG to OM, & SVG to Diagonal). LVEF noted to be 35% by cath.  Echo 02/24/15 with LVEF=20-25%. He has been seen by Dr. Lovena Le in the EP clinic to discuss ICD but he has continued to have class 1 symptoms. 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   He is here today for follow up. He denies chest pain, SOB or palpitations.  Still working full time without limitations.   Primary Care Physician: [name], MD  Past Medical History:  Diagnosis Date  . Anginal pain ([place])    none since 2015 [facility].  . Coronary artery disease    Dr. Issachar Broady,cardiology follows  . Fatty liver   . Hyperlipidemia, mixed   . Hypertension   . Hypothyroidism    no longer on meds-not in 15 yrs.  . Kidney stones    "passed on my own" (Dec 21, 2014)  . 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 Type II diabetes mellitus ([place])     Past Surgical History:  Procedure Laterality Date  . CARDIAC CATHETERIZATION  December 21, 2014   "recommending about OHS"  . COLONOSCOPY WITH PROPOFOL N/A 03/29/2016   Procedure: COLONOSCOPY WITH PROPOFOL;  Surgeon: Doran Stabler, MD;  Location: [facility] ENDOSCOPY;  Service: Gastroenterology;  Laterality: N/A;  . CORONARY ANGIOPLASTY  01/2004   failed LAD PCI  . CORONARY ANGIOPLASTY WITH STENT PLACEMENT  12/2003   CFX  . CORONARY ARTERY BYPASS GRAFT N/A 12/09/2014   Procedure: CORONARY ARTERY BYPASS GRAFTING (CABG), ON PUMP, TIMES FOUR, USING LEFT INTERNAL MAMMARY ARTERY, RIGHT GREATER SAPHENOUS VEIN HARVESTED ENDOSCOPICALLY;  Surgeon: Rexene Alberts, MD;  Location: [place];  Service: Open Heart Surgery;  Laterality: N/A;  . INTRAOPERATIVE TRANSESOPHAGEAL ECHOCARDIOGRAM N/A 12/09/2014   Procedure: INTRAOPERATIVE TRANSESOPHAGEAL ECHOCARDIOGRAM;  Surgeon: Rexene Alberts, MD;  Location: [place];  Service: Open Heart Surgery;  Laterality: N/A;  . LEFT HEART CATHETERIZATION WITH CORONARY ANGIOGRAM N/A Dec 21, 2014   Procedure: LEFT HEART CATHETERIZATION WITH CORONARY ANGIOGRAM;  Surgeon: Burnell Blanks, MD;  Location: [facility] CATH LAB;  Service: Cardiovascular;  Laterality: N/A;  . WISDOM TOOTH EXTRACTION  1980's   'all 4"    Current Outpatient Prescriptions  Medication Sig Dispense Refill  . aspirin EC 325 MG EC tablet Take 1 tablet (325 mg total) by mouth daily. 30 tablet 0  . carvedilol (COREG) 25 MG tablet TAKE 1 TABLET BY MOUTH TWICE DAILY WITH A MEAL 60 tablet 6  . fluticasone (FLONASE) 50 MCG/ACT nasal spray Place 1 spray into both nostrils daily as needed for allergies or rhinitis.    Marland Kitchen loratadine (CLARITIN) 10 MG tablet Take 10 mg by mouth every morning.     . metFORMIN (GLUCOPHAGE) 1000 MG tablet Take 1,000-1,500 mg by mouth 2 (  two) times daily with a meal. *TAKES 1000MG  IN THE MORNING AND 1500MG  IN THE EVENING*    . multivitamin (THERAGRAN) per tablet Take 1 tablet by mouth every morning.     . nitroGLYCERIN (NITROSTAT) 0.4 MG SL tablet Place 0.4 mg under the tongue every 5 (five) minutes x 3 doses as needed for chest pain.    Marland Kitchen quinapril (ACCUPRIL) 40 MG tablet TAKE 2 TABLETS BY MOUTH DAILY 60 tablet 3  . rosuvastatin (CRESTOR) 10 MG tablet TAKE 1 TABLET BY MOUTH ONCE DAILY 30 tablet 6  . spironolactone (ALDACTONE) 25 MG tablet TAKE 1/2 TABLET BY MOUTH ONCE DAILY 30 tablet 1   No current facility-administered medications for this visit.     No  Known Allergies  Social History   Social History  . Marital status: Single    Spouse name: N/A  . Number of children: 2  . Years of education: N/A   Occupational History  . [employer] Other  .  [employer]   Social History Main Topics  . Smoking status: Former Smoker    Packs/day: 0.50    Years: 30.00    Types: Cigarettes  . Smokeless tobacco: Never Used     Comment: "quit smoking cigarettes in ~ 2007"  . Alcohol use No  . Drug use: No  . Sexual activity: Yes   Other Topics Concern  . Not on file   Social History Narrative  . No narrative on file    Family History  Problem Relation Age of Onset  . Coronary artery disease Father 53  . Hypertension Mother   . Heart attack Paternal Grandfather     x2 (MI at age 32 was his cause of death)    Review of Systems:  As stated in the HPI and otherwise negative.   BP (!) 156/100   Pulse 77   Ht 5\' 10"  (1.778 m)   Wt 217 lb 9.6 oz (98.7 kg)   BMI 31.22 kg/m   Physical Examination: General: Well developed, well nourished, NAD  HEENT: OP clear, mucus membranes moist  SKIN: warm, dry. No rashes. Neuro: No focal deficits  Musculoskeletal: Muscle strength 5/5 all ext  Psychiatric: Mood and affect normal  Neck: No JVD, no carotid bruits, no thyromegaly, no lymphadenopathy.  Lungs:Clear bilaterally, no wheezes, rhonci, crackles Cardiovascular: Regular rate and rhythm. No murmurs, gallops or rubs. Abdomen:Soft. Bowel sounds present. Non-tender.  Extremities: No lower extremity edema. Pulses are 2 + in the bilateral DP/PT.  Echo October 2017: Left ventricle: The cavity size was moderately dilated. Systolic   function was severely reduced. The estimated ejection fraction   was in the range of 25% to 30%. There is akinesis of the   inferolateral, inferior, and inferoseptal myocardium. Doppler   parameters are consistent with abnormal left ventricular   relaxation (grade 1 diastolic dysfunction). - Aorta:  Ascending aortic diameter: 41 mm (S). - Ascending aorta: The ascending aorta was mildly dilated. - Left atrium: The atrium was moderately dilated. - Right ventricle: The cavity size was mildly dilated. Wall   thickness was normal. - Tricuspid valve: There was trivial regurgitation.  EKG:  EKG is ordered today. The ekg ordered today demonstrates NSR, rate 77 bpm. Non-specific ST abnormality.  Recent Labs: No results found for requested labs within last 8760 hours.   Lipid Panel    Component Value Date/Time   CHOL 115 02/10/2015 0834   TRIG 123.0 02/10/2015 0834   HDL 26.90 (L) 02/10/2015 [id number]  CHOLHDL 4 02/10/2015 0834   VLDL 24.6 02/10/2015 0834   LDLCALC 64 02/10/2015 0834     Wt Readings from Last 3 Encounters:  10/18/16 217 lb 9.6 oz (98.7 kg)  03/29/16 212 lb (96.2 kg)  03/01/16 212 lb (96.2 kg)     Other studies Reviewed: Additional studies/ records that were reviewed today include: . Review of the above records demonstrates:    Assessment and Plan:   1. CAD: s/p CABG December 09, 2014. Doing well. No chest pain suggestive of angina.He is on ASA, Coreg, Accupril and Crestor.  He has had no chest pain concerning for angina.   2. HYPERTENSION: BP has been well controlled at home. No changes today.    3. Ischemic cardiomyopathy: LVEF=20-25% s/p CABG 12/09/14. He has been seen by Dr. Lovena Le and there has been a discussion regarding an ICD but he is still working with high voltage so ICD is not recommended. He has NYHA class 1 symptoms.     4. Hyperlipidemia: Continue statin. Followed in primary care.  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   Current medicines are reviewed at length with the patient today.  The patient does not have concerns regarding medicines.  The following changes have been made:  no change  Labs/ tests ordered today include:   Orders Placed This Encounter  Procedures  . EKG 12-Lead    Disposition:   FU with me in 6  months  Signed, Lauree Chandler, MD 10/18/2016 9:23 AM    [address], [place], [place]  [address] Phone: [phone]; Fax: [phone]

## 2016-10-18 NOTE — Patient Instructions (Signed)

## 2016-11-11 MED FILL — metFORMIN HCL 1000 MG TABS: 1000 | 90 days supply | Qty: 225 | Fill #0

## 2016-11-17 MED FILL — CARVEDILOL 25 MG TABLET: 25 | 30 days supply | Qty: 60 | Fill #1

## 2016-11-18 MED FILL — ROSUVASTATIN CALCIUM 10 MG: 10 | 30 days supply | Qty: 30 | Fill #5

## 2016-11-18 MED FILL — QUINAPRIL 40 MG TABLET: 40 | 30 days supply | Qty: 60 | Fill #1

## 2016-11-24 IMAGING — CR DG CHEST 1V PORT
1 series · 1 of 1 positions shown · non-contrast
Comparison: 12/08/2014

CLINICAL DATA: Post CABG.

EXAM:
PORTABLE CHEST - 1 VIEW

[portable]
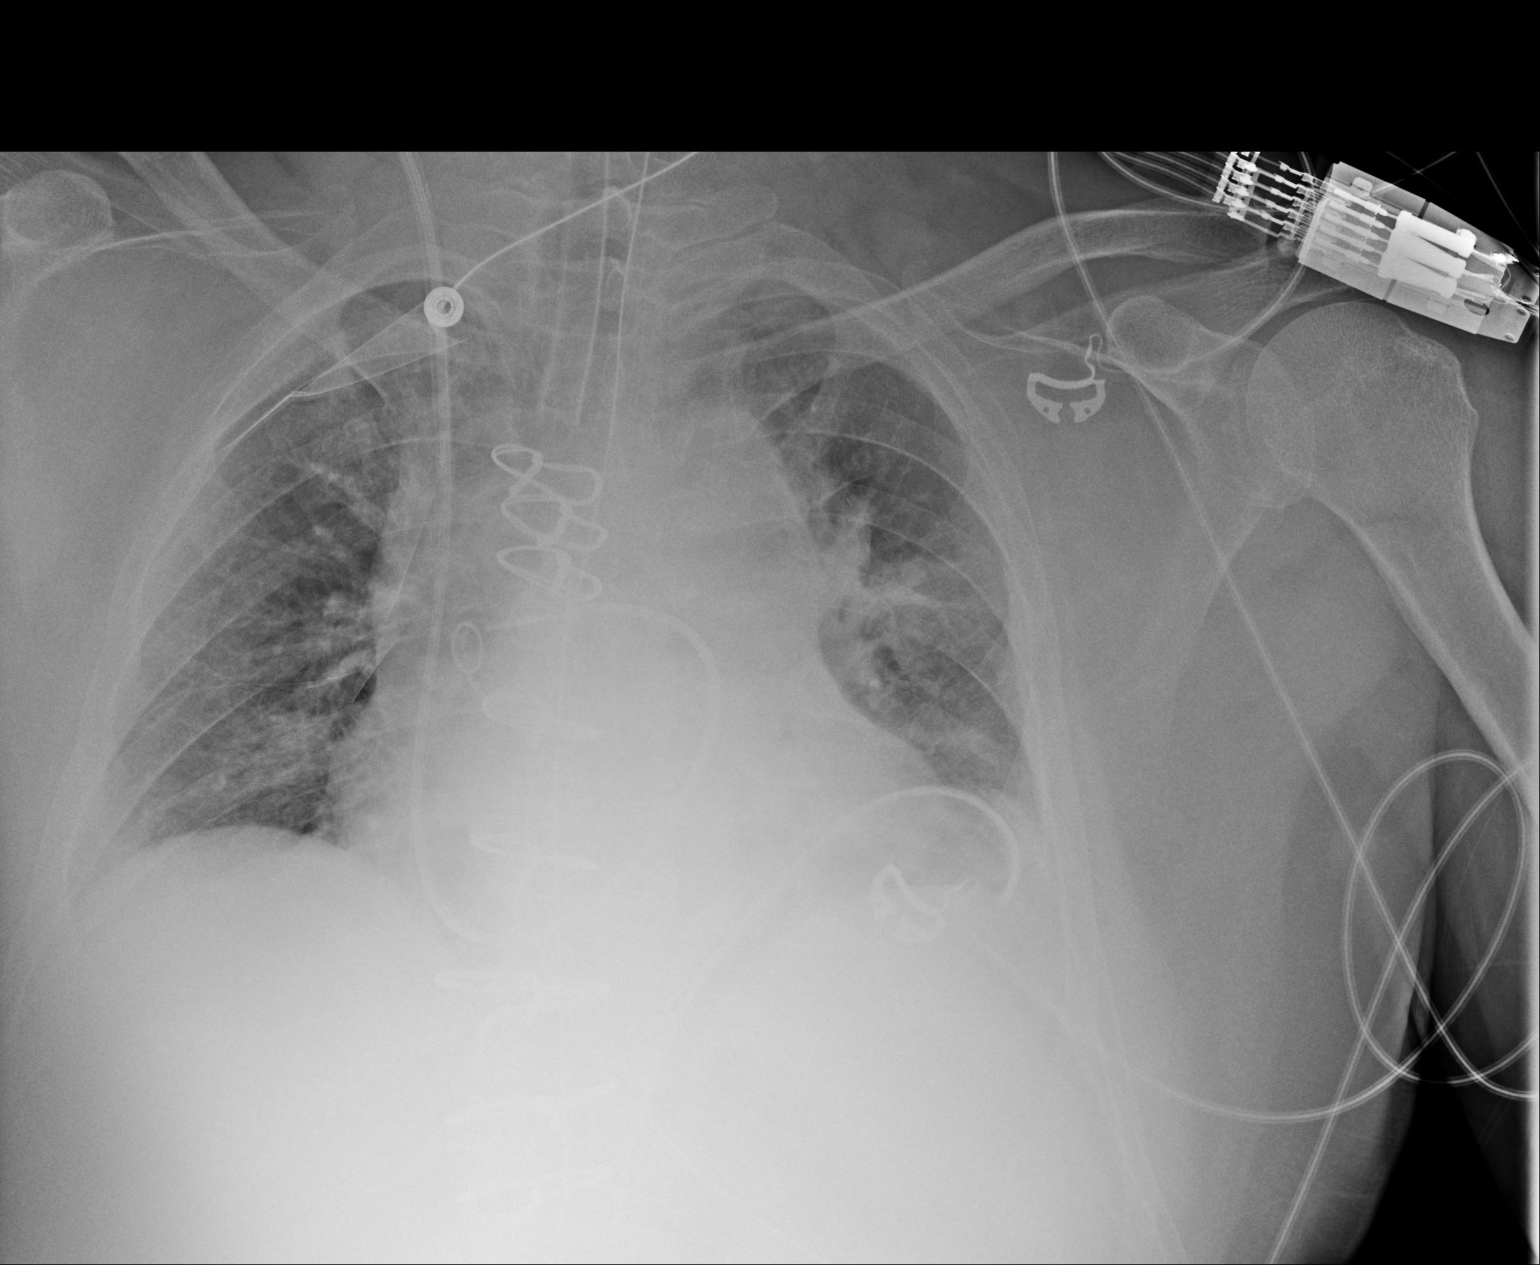

[1 of 1 positions shown; findings below may reference images not displayed]

FINDINGS: Changes of CABG. Swan-Ganz catheter tip is in the right main
pulmonary artery. Endotracheal tube is 4 cm above the carina. Left
basilar chest tube in place. No pneumothorax. NG tube enters the
stomach.

There is cardiomegaly with vascular congestion and areas of
perihilar and lower lobe atelectasis. Low lung volumes.
IMPRESSION: Postoperative changes with perihilar and lower lobe atelectasis. Low
lung volumes. No pneumothorax.

## 2016-11-27 IMAGING — CR DG CHEST 2V
2 series · 2 of 2 positions shown · non-contrast
Comparison: Portable chest x-ray December 11, 2014 and
preoperative study December 08, 2014.

CLINICAL DATA: Sore chest; status post CABG 3 days ago

EXAM:
CHEST  2 VIEW

[chest pa]
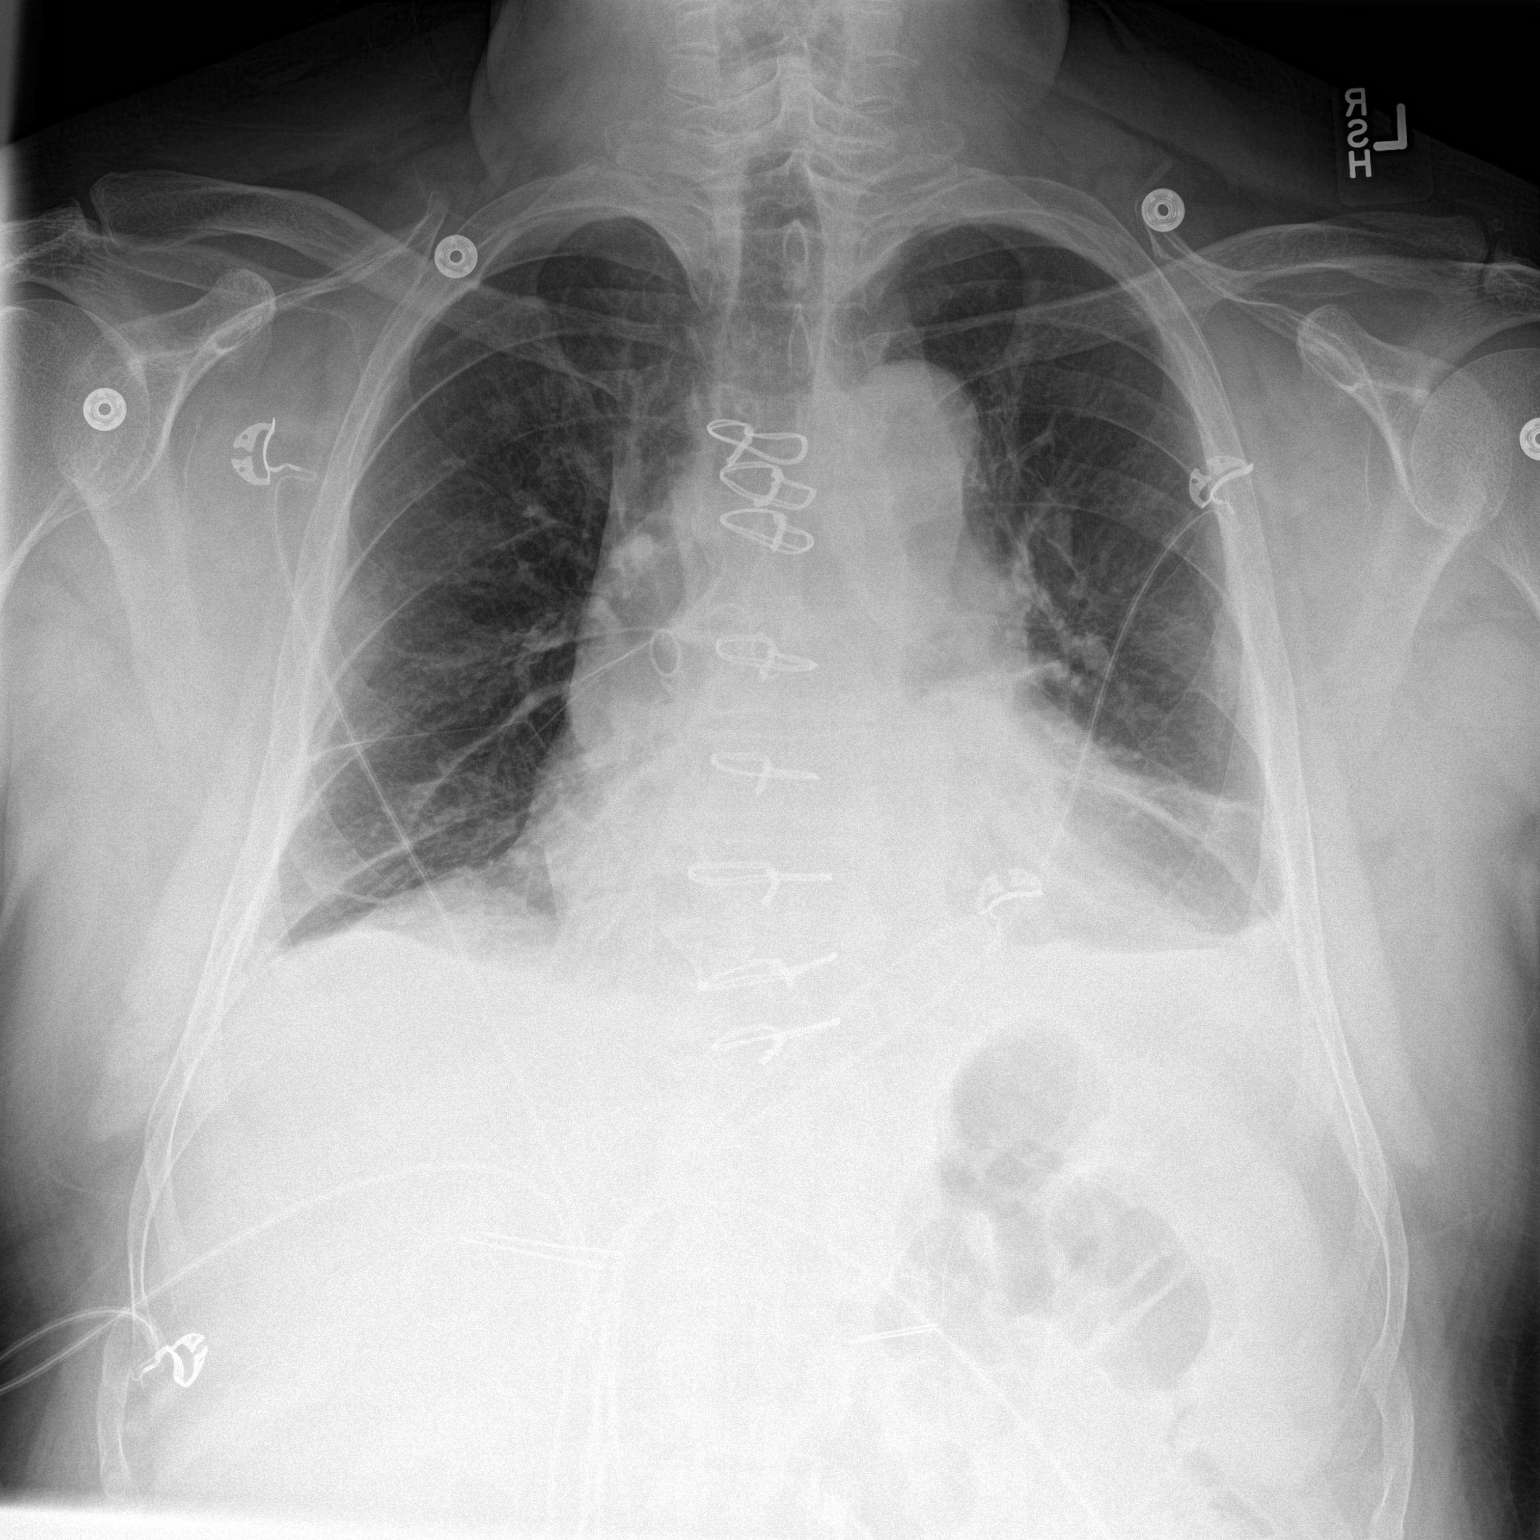

[chest lat]
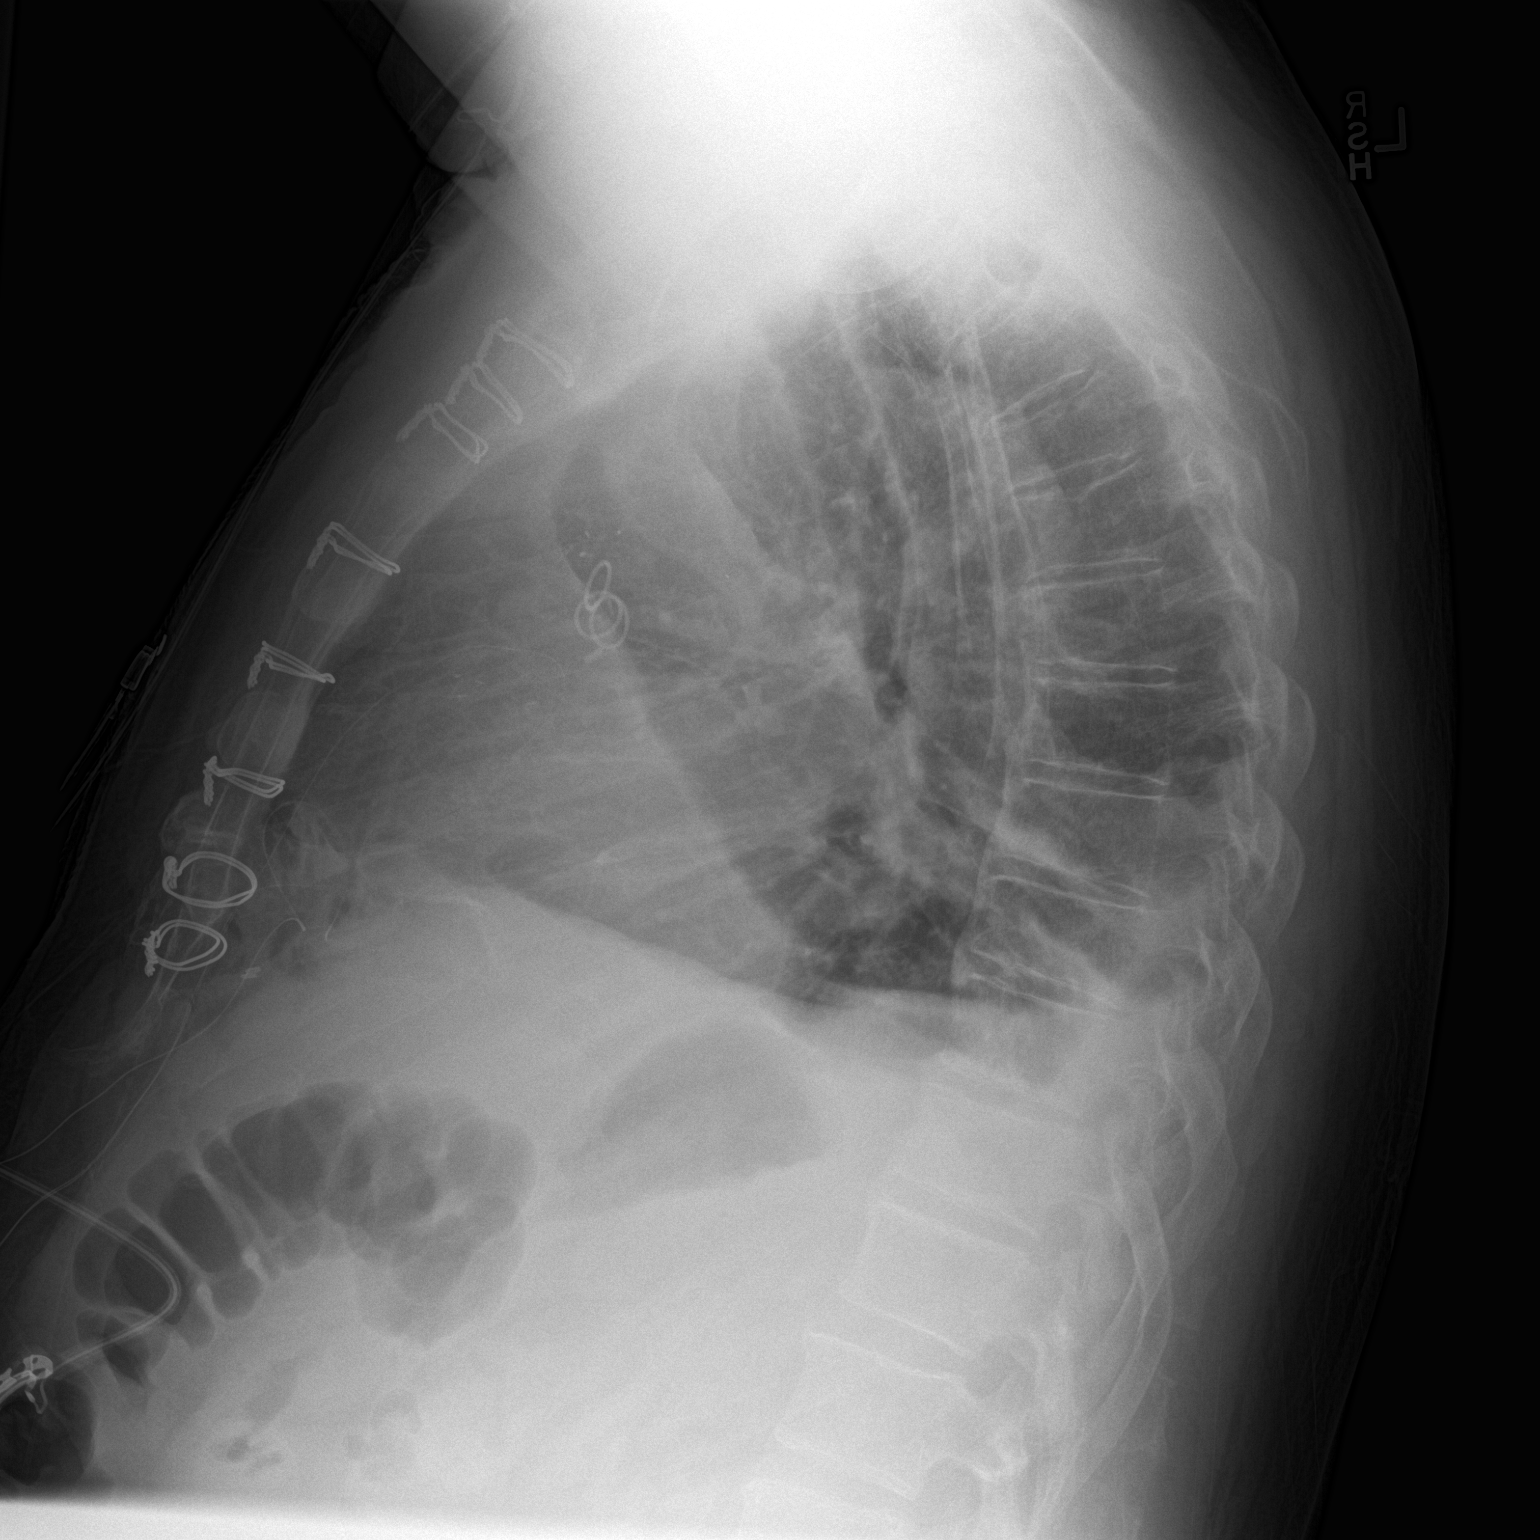

[2 of 2 positions shown; findings below may reference images not displayed]

FINDINGS: The lungs are adequately inflated. There has is persistent bibasilar
atelectasis with small bilateral pleural effusions. The cardiac
silhouette is enlarged. The pulmonary vascularity is not engorged.
The mediastinum is mildly widened consistent with previous CABG.
There are 8 intact sternal wires. The trachea is midline. The right
internal jugular Cordis sheath has been removed. There is stable
mild loss of height of an upper thoracic vertebral body.
IMPRESSION: There is persistent bibasilar atelectasis greater on the left than
on the right. A small left pleural effusion persists. There has been
slight interval improvement in the appearance of the pulmonary
interstitium since the previous study.

## 2016-12-19 ENCOUNTER — Other Ambulatory Visit: Payer: Self-pay | Admitting: Cardiovascular Disease

## 2016-12-19 MED FILL — CARVEDILOL 25 MG TABLET: 25 | 30 days supply | Qty: 60 | Fill #2

## 2016-12-19 MED FILL — ROSUVASTATIN CALCIUM 10 MG: 10 | 30 days supply | Qty: 30 | Fill #6

## 2016-12-19 MED FILL — QUINAPRIL 40 MG TABLET: 40 | 30 days supply | Qty: 60 | Fill #2

## 2016-12-19 MED FILL — SPIRONOLACTONE 25 MG TABLET: 25 | 90 days supply | Qty: 45 | Fill #0

## 2016-12-21 ENCOUNTER — Emergency Department (HOSPITAL_COMMUNITY): Payer: BLUE CROSS/BLUE SHIELD

## 2016-12-21 ENCOUNTER — Emergency Department (HOSPITAL_COMMUNITY)
Admission: EM | Admit: 2016-12-21 | Discharge: 2016-12-21 | Disposition: A | Discharge status: Home / Self Care | Payer: BLUE CROSS/BLUE SHIELD | Attending: Emergency Medicine | Admitting: Emergency Medicine

## 2016-12-21 ENCOUNTER — Encounter (HOSPITAL_COMMUNITY): Payer: Self-pay | Admitting: Emergency Medicine

## 2016-12-21 DIAGNOSIS — Z87891 Personal history of nicotine dependence: Secondary | ICD-10-CM | POA: Insufficient documentation

## 2016-12-21 DIAGNOSIS — Z7982 Long term (current) use of aspirin: Secondary | ICD-10-CM | POA: Diagnosis not present

## 2016-12-21 DIAGNOSIS — I5022 Chronic systolic (congestive) heart failure: Secondary | ICD-10-CM | POA: Diagnosis not present

## 2016-12-21 DIAGNOSIS — Z7984 Long term (current) use of oral hypoglycemic drugs: Secondary | ICD-10-CM | POA: Diagnosis not present

## 2016-12-21 DIAGNOSIS — E039 Hypothyroidism, unspecified: Secondary | ICD-10-CM | POA: Diagnosis not present

## 2016-12-21 DIAGNOSIS — Z955 Presence of coronary angioplasty implant and graft: Secondary | ICD-10-CM | POA: Insufficient documentation

## 2016-12-21 DIAGNOSIS — I2510 Atherosclerotic heart disease of native coronary artery without angina pectoris: Secondary | ICD-10-CM | POA: Diagnosis not present

## 2016-12-21 DIAGNOSIS — M545 Low back pain, unspecified: Secondary | ICD-10-CM

## 2016-12-21 DIAGNOSIS — I110 Hypertensive heart disease with heart failure: Secondary | ICD-10-CM | POA: Diagnosis not present

## 2016-12-21 DIAGNOSIS — R109 Unspecified abdominal pain: Secondary | ICD-10-CM

## 2016-12-21 DIAGNOSIS — Z951 Presence of aortocoronary bypass graft: Secondary | ICD-10-CM | POA: Insufficient documentation

## 2016-12-21 LAB — URINALYSIS, ROUTINE W REFLEX MICROSCOPIC
Bacteria, UA: NONE SEEN
GLUCOSE, UA: NEGATIVE mg/dL
HGB URINE DIPSTICK: NEGATIVE
Ketones, ur: 5 mg/dL — AB
Leukocytes, UA: NEGATIVE
NITRITE: NEGATIVE
PH: 5 (ref 5.0–8.0)
Protein, ur: 100 mg/dL — AB
SPECIFIC GRAVITY, URINE: 1.04 — AB (ref 1.005–1.030)

## 2016-12-21 LAB — COMPREHENSIVE METABOLIC PANEL
ALT: 28 U/L (ref 17–63)
AST: 28 U/L (ref 15–41)
Albumin: 3.9 g/dL (ref 3.5–5.0)
Alkaline Phosphatase: 60 U/L (ref 38–126)
Anion gap: 6 (ref 5–15)
BILIRUBIN TOTAL: 1.1 mg/dL (ref 0.3–1.2)
BUN: 16 mg/dL (ref 6–20)
CHLORIDE: 108 mmol/L (ref 101–111)
CO2: 25 mmol/L (ref 22–32)
CREATININE: 1.13 mg/dL (ref 0.61–1.24)
Calcium: 9.4 mg/dL (ref 8.9–10.3)
Glucose, Bld: 164 mg/dL — ABNORMAL HIGH (ref 65–99)
Potassium: 4.6 mmol/L (ref 3.5–5.1)
Sodium: 139 mmol/L (ref 135–145)
TOTAL PROTEIN: 7.3 g/dL (ref 6.5–8.1)

## 2016-12-21 LAB — CBC
HCT: 46.9 % (ref 39.0–52.0)
Hemoglobin: 15.8 g/dL (ref 13.0–17.0)
MCH: 29.3 pg (ref 26.0–34.0)
MCHC: 33.7 g/dL (ref 30.0–36.0)
MCV: 87 fL (ref 78.0–100.0)
PLATELETS: 177 10*3/uL (ref 150–400)
RBC: 5.39 MIL/uL (ref 4.22–5.81)
RDW: 13.9 % (ref 11.5–15.5)
WBC: 7.1 10*3/uL (ref 4.0–10.5)

## 2016-12-21 LAB — LIPASE, BLOOD: LIPASE: 18 U/L (ref 11–51)

## 2016-12-21 MED ORDER — TRAMADOL HCL 50 MG PO TABS: 50.0000 mg | ORAL_TABLET | Freq: Four times a day (QID) | ORAL | 0 refills | Status: DC | PRN

## 2016-12-21 MED ORDER — HYDROCODONE-ACETAMINOPHEN 5-325 MG PO TABS
2.0000 | ORAL_TABLET | Freq: Once | ORAL | Status: AC
  Administered 2016-12-21: 2 via ORAL
  Filled 2016-12-21: qty 2

## 2016-12-21 MED ORDER — ONDANSETRON 4 MG PO TBDP
8.0000 mg | ORAL_TABLET | Freq: Once | ORAL | Status: AC
  Administered 2016-12-21: 8 mg via ORAL
  Filled 2016-12-21: qty 2

## 2016-12-21 NOTE — ED Notes (Signed)
Taken to CT.

## 2016-12-21 NOTE — ED Provider Notes (Signed)
[facility] DEPT Provider Note   CSN: [id number] Arrival date & time: 12/21/16  1017     History   Chief Complaint Chief Complaint  Patient presents with  . Abdominal Pain  . Generalized Body Aches  . Back Pain  . Diarrhea    HPI JACQUIN BALDEZ is a 65 y.o. male.  Patient c/o low back pain for the past month, bilateral, constant, dull.  Worse w bending over and then standing back up. No radicular pain. No numbness/weakness. No fever or chills. Denies specific injury.  Pt also c/o abdominal pain for the past few months. Not localized to any one area. States has periods where pain bothers him worse - no consistent exacerbating factors. Not related to eating. In past day, also c/o diarrhea, with 5-6 loose/watery stools. No recent abx use. No dysuria or hematuria.    The history is provided by the patient.  Abdominal Pain   Associated symptoms include diarrhea. Pertinent negatives include fever, dysuria and hematuria.  Back Pain   Associated symptoms include abdominal pain. Pertinent negatives include no chest pain, no fever, no numbness, no dysuria and no weakness.  Diarrhea   Associated symptoms include abdominal pain. Pertinent negatives include no chills.    Past Medical History:  Diagnosis Date  . Anginal pain ([place])    none since 2015 [facility]PG.  . Coronary artery disease    Dr. McAlhany,cardiology follows  . Fatty liver   . Hyperlipidemia, mixed   . Hypertension   . Hypothyroidism    no longer on meds-not in 15 yrs.  . Kidney stones    "passed on my own" (12-10-2014)  . 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 Type II diabetes mellitus [facility])     Patient Active Problem List   Diagnosis Date Noted  . Chronic systolic heart failure ([place]) 09/08/2015  . 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 Unstable angina ([place]) 2014/12/10  . Exertional angina ([place]) 12/02/2014  . Type 2 diabetes  12/02/2014  .  Hypothyroidism 08/17/2009  . HYPERLIPIDEMIA, MIXED 08/17/2009  . Essential hypertension 08/17/2009  . CAD S/P CFX stent '05, failed LAD PCI '05 08/17/2009    Past Surgical History:  Procedure Laterality Date  . CARDIAC CATHETERIZATION  2014/12/10   "recommending about OHS"  . COLONOSCOPY WITH PROPOFOL N/A 03/29/2016   Procedure: COLONOSCOPY WITH PROPOFOL;  Surgeon: Doran Stabler, MD;  Location: [facility];  Service: Gastroenterology;  Laterality: N/A;  . CORONARY ANGIOPLASTY  01/2004   failed LAD PCI  . CORONARY ANGIOPLASTY WITH STENT PLACEMENT  12/2003   CFX  . CORONARY ARTERY BYPASS GRAFT N/A 12/09/2014   Procedure: CORONARY ARTERY BYPASS GRAFTING (CABG), ON PUMP, TIMES FOUR, USING LEFT INTERNAL MAMMARY ARTERY, RIGHT GREATER SAPHENOUS VEIN HARVESTED ENDOSCOPICALLY;  Surgeon: Rexene Alberts, MD;  Location: [place];  Service: Open Heart Surgery;  Laterality: N/A;  . INTRAOPERATIVE TRANSESOPHAGEAL ECHOCARDIOGRAM N/A 12/09/2014   Procedure: INTRAOPERATIVE TRANSESOPHAGEAL ECHOCARDIOGRAM;  Surgeon: Rexene Alberts, MD;  Location: [place];  Service: Open Heart Surgery;  Laterality: N/A;  . LEFT HEART CATHETERIZATION WITH CORONARY ANGIOGRAM N/A 12-10-14   Procedure: LEFT HEART CATHETERIZATION WITH CORONARY ANGIOGRAM;  Surgeon: Burnell Blanks, MD;  Location: [facility] CATH LAB;  Service: Cardiovascular;  Laterality: N/A;  . WISDOM TOOTH EXTRACTION  1980's   'all 4"       Home Medications  Prior to Admission medications   Medication Sig Start Date End Date Taking? Authorizing Provider  acetaminophen (TYLENOL) 325 MG tablet Take 650 mg by mouth every 6 (six) hours as needed for moderate pain or headache.   Yes Historical Provider, MD  aspirin EC 325 MG EC tablet Take 1 tablet (325 mg total) by mouth daily. 12/13/14  Yes Gina L Collins, PA-C  carvedilol (COREG) 25 MG tablet TAKE 1 TABLET BY MOUTH TWICE DAILY WITH A MEAL Patient taking differently: TAKE 25 MG BY MOUTH TWICE DAILY WITH A  MEAL 10/18/16  Yes Burnell Blanks, MD  loratadine (CLARITIN) 10 MG tablet Take 10 mg by mouth every morning.    Yes Historical Provider, MD  metFORMIN (GLUCOPHAGE) 1000 MG tablet Take 1,000-1,500 mg by mouth See admin instructions. *TAKES 1000MG  IN THE MORNING AND 1500MG  IN THE EVENING*   Yes Historical Provider, MD  multivitamin [facility]) per tablet Take 1 tablet by mouth every morning.    Yes Historical Provider, MD  quinapril (ACCUPRIL) 40 MG tablet TAKE 2 TABLETS BY MOUTH DAILY Patient taking differently: TAKE 80 MG BY MOUTH DAILY 10/11/16  Yes Burnell Blanks, MD  rosuvastatin (CRESTOR) 10 MG tablet TAKE 1 TABLET BY MOUTH ONCE DAILY Patient taking differently: TAKE 10 BY MOUTH ONCE DAILY AT NIGHT 06/11/16  Yes Burnell Blanks, MD  spironolactone (ALDACTONE) 25 MG tablet TAKE 1/2 TABLET BY MOUTH ONCE DAILY Patient taking differently: TAKE 12.5 MG BY MOUTH ONCE DAILY 12/19/16  Yes Burnell Blanks, MD  fluticasone (FLONASE) 50 MCG/ACT nasal spray Place 1 spray into both nostrils daily as needed for allergies or rhinitis.    Historical Provider, MD  nitroGLYCERIN (NITROSTAT) 0.4 MG SL tablet Place 0.4 mg under the tongue every 5 (five) minutes x 3 doses as needed for chest pain.    Historical Provider, MD    Family History Family History  Problem Relation Age of Onset  . Coronary artery disease Father 65  . Hypertension Mother   . Heart attack Paternal Grandfather     x2 (MI at age 9 was his cause of death)    Social History Social History  Substance Use Topics  . Smoking status: Former Smoker    Packs/day: 0.50    Years: 30.00    Types: Cigarettes  . Smokeless tobacco: Never Used     Comment: "quit smoking cigarettes in ~ 2007"  . Alcohol use No     Allergies   Patient has no known allergies.   Review of Systems Review of Systems  Constitutional: Negative for chills and fever.  HENT: Negative for sore throat.   Eyes: Negative for redness.    Respiratory: Negative for shortness of breath.   Cardiovascular: Negative for chest pain.  Gastrointestinal: Positive for abdominal pain and diarrhea.  Genitourinary: Negative for dysuria and hematuria.  Musculoskeletal: Positive for back pain. Negative for neck pain.  Skin: Negative for rash.  Neurological: Negative for weakness and numbness.  Hematological: Does not bruise/bleed easily.  Psychiatric/Behavioral: Negative for confusion.     Physical Exam Updated Vital Signs BP 149/94 (BP Location: Left Arm)   Pulse 80   Temp 98.4 F (36.9 C) (Oral)   Resp 16   Ht 5\' 10"  (1.778 m)   Wt 95.3 kg   SpO2 100%   BMI 30.13 kg/m   Physical Exam  Constitutional: He appears well-developed and well-nourished. No distress.  HENT:  Mouth/Throat: Oropharynx is clear and moist.  Eyes: Conjunctivae are normal.  Neck: Neck supple. No tracheal deviation present.  Cardiovascular: Normal rate, regular rhythm, normal heart sounds and intact distal pulses.   Pulmonary/Chest: Effort normal and breath sounds normal. No accessory muscle usage. No respiratory distress.  Abdominal: Soft. Bowel sounds are normal. He exhibits no distension. There is no tenderness.  Musculoskeletal: He exhibits no edema.  TLS spine non tender. Lumbar muscular tenderness.   Neurological: He is alert.  Steady gait.   Skin: Skin is warm and dry. No rash noted. He is not diaphoretic.  Psychiatric: He has a normal mood and affect.  Nursing note and vitals reviewed.    ED Treatments / Results  Labs (all labs ordered are listed, but only abnormal results are displayed) Results for orders placed or performed during the hospital encounter of 12/21/16  Lipase, blood  Result Value Ref Range   Lipase 18 11 - 51 U/L  Comprehensive metabolic panel  Result Value Ref Range   Sodium 139 135 - 145 mmol/L   Potassium 4.6 3.5 - 5.1 mmol/L   Chloride 108 101 - 111 mmol/L   CO2 25 22 - 32 mmol/L   Glucose, Bld 164 (H) 65 - 99  mg/dL   BUN 16 6 - 20 mg/dL   Creatinine, Ser 1.13 0.61 - 1.24 mg/dL   Calcium 9.4 8.9 - 10.3 mg/dL   Total Protein 7.3 6.5 - 8.1 g/dL   Albumin 3.9 3.5 - 5.0 g/dL   AST 28 15 - 41 U/L   ALT 28 17 - 63 U/L   Alkaline Phosphatase 60 38 - 126 U/L   Total Bilirubin 1.1 0.3 - 1.2 mg/dL   GFR calc non Af Amer >60 >60 mL/min   GFR calc Af Amer >60 >60 mL/min   Anion gap 6 5 - 15  CBC  Result Value Ref Range   WBC 7.1 4.0 - 10.5 K/uL   RBC 5.39 4.22 - 5.81 MIL/uL   Hemoglobin 15.8 13.0 - 17.0 g/dL   HCT 46.9 39.0 - 52.0 %   MCV 87.0 78.0 - 100.0 fL   MCH 29.3 26.0 - 34.0 pg   MCHC 33.7 30.0 - 36.0 g/dL   RDW 13.9 11.5 - 15.5 %   Platelets 177 150 - 400 K/uL  Urinalysis, Routine w reflex microscopic  Result Value Ref Range   Color, Urine AMBER (A) YELLOW   APPearance HAZY (A) CLEAR   Specific Gravity, Urine 1.040 (H) 1.005 - 1.030   pH 5.0 5.0 - 8.0   Glucose, UA NEGATIVE NEGATIVE mg/dL   Hgb urine dipstick NEGATIVE NEGATIVE   Bilirubin Urine SMALL (A) NEGATIVE   Ketones, ur 5 (A) NEGATIVE mg/dL   Protein, ur 100 (A) NEGATIVE mg/dL   Nitrite NEGATIVE NEGATIVE   Leukocytes, UA NEGATIVE NEGATIVE   RBC / HPF 0-5 0 - 5 RBC/hpf   WBC, UA 0-5 0 - 5 WBC/hpf   Bacteria, UA NONE SEEN NONE SEEN   Squamous Epithelial / LPF 0-5 (A) NONE SEEN   Mucous PRESENT    Hyaline Casts, UA PRESENT    Ct Renal Stone Study  Result Date: 12/21/2016 CLINICAL DATA:  Pt c/o back pain for 3 weeks all the way across lower back and rt upper abd pain that began yesterday some nausea no vomiting EXAM: CT ABDOMEN AND PELVIS WITHOUT CONTRAST TECHNIQUE: Multidetector CT imaging of the abdomen and pelvis was performed following the standard protocol without IV contrast. COMPARISON:  01/01/2012 FINDINGS: Lower chest: No acute abnormality. Hepatobiliary: Liver shows relative  decreased attenuation suggesting fatty infiltration. No liver mass or focal lesion. There are dependent gallstones. No evidence of acute  cholecystitis. No bile duct dilation. Pancreas: Unremarkable. No pancreatic ductal dilatation or surrounding inflammatory changes. Spleen: Normal in size without focal abnormality. Adrenals/Urinary Tract: No adrenal masses. Bilateral nonobstructing intrarenal stones. No renal masses. No hydronephrosis. Normal ureters. Unremarkable bladder. Stomach/Bowel: Stomach is within normal limits. Appendix appears normal. No evidence of bowel wall thickening, distention, or inflammatory changes. Vascular/Lymphatic: Aortic atherosclerosis. No enlarged abdominal or pelvic lymph nodes. Reproductive: Prostate is unremarkable. Other: No abdominal wall hernia or abnormality. No abdominopelvic ascites. Musculoskeletal: No fracture or acute finding. No osteoblastic or osteolytic lesions. Disc degenerative changes most evident at L5-S1. IMPRESSION: 1. No acute findings within the abdomen pelvis. 2. There are bilateral nonobstructing intrarenal stones. No ureteral stone or obstructive uropathy. 3. Degenerative changes of the lower lumbar spine, stable compared to the prior CT. 4. Aortic atherosclerosis. Electronically Signed   By: Lajean Manes M.D.   On: 12/21/2016 12:56    EKG  EKG Interpretation None       Radiology No results found.  Procedures Procedures (including critical care time)  Medications Ordered in ED Medications - No data to display   Initial Impression / Assessment and Plan / ED Course  I have reviewed the triage vital signs and the nursing notes.  Pertinent labs & imaging results that were available during my care of the patient were reviewed by me and considered in my medical decision making (see chart for details).  Clinical Course     Labs.   Reviewed nursing notes and prior charts for additional history.   Motrin, hydrocodone in ed for pain.  Recheck pt comfortable appearing.   Pt had indicated felt right sided pain possibly c/w kidney stone.   No ureteral stone or other acute  process on ct.   Pt appears stable for d/c.     Final Clinical Impressions(s) / ED Diagnoses   Final diagnoses:  None    New Prescriptions New Prescriptions   No medications on file     Lajean Saver, MD 12/21/16 1536

## 2016-12-21 NOTE — ED Notes (Signed)
Reminded pt of need for urine specimen  

## 2016-12-21 NOTE — ED Triage Notes (Signed)
Pt c/o low back pain, indigestion, diarrhea and "stomach being tore up" -- for 4 months-- the diarrhea started yesterday---- has recently had a colonoscopy -- no issues per pt.  Pt also has a hx of [facility] spotted Fever 2 years ago.  Also has hx of kidney stones-- not sure if pain is from another kidney stone-- per pt

## 2016-12-21 NOTE — Discharge Instructions (Signed)
It was our pleasure to provide your ER care today - we hope that you feel better.  Your CT scan was read as showing bilateral kidney stones, but no kidney stones obstructing the ureters or causing acute pain.  No other acute process was seen on the CT scan.  Incidental note was made of degenerative changes in your back, which may be contributing to your back pain.  Take motrin or aleve as need for pain.  You may also take ultram as need for pain - no driving when taking.   Follow up with primary care doctor in the next 1-2 weeks.   Return to ER if worse, new symptoms, fevers, intractable pain, other concern.  You were given pain medication in the ER - no driving for the next 4 hours.

## 2017-01-01 IMAGING — CR DG CHEST 2V
2 series · 2 of 2 positions shown · non-contrast
Comparison: None.

CLINICAL DATA: CABG.

EXAM:
CHEST  2 VIEW

[w chest pa]
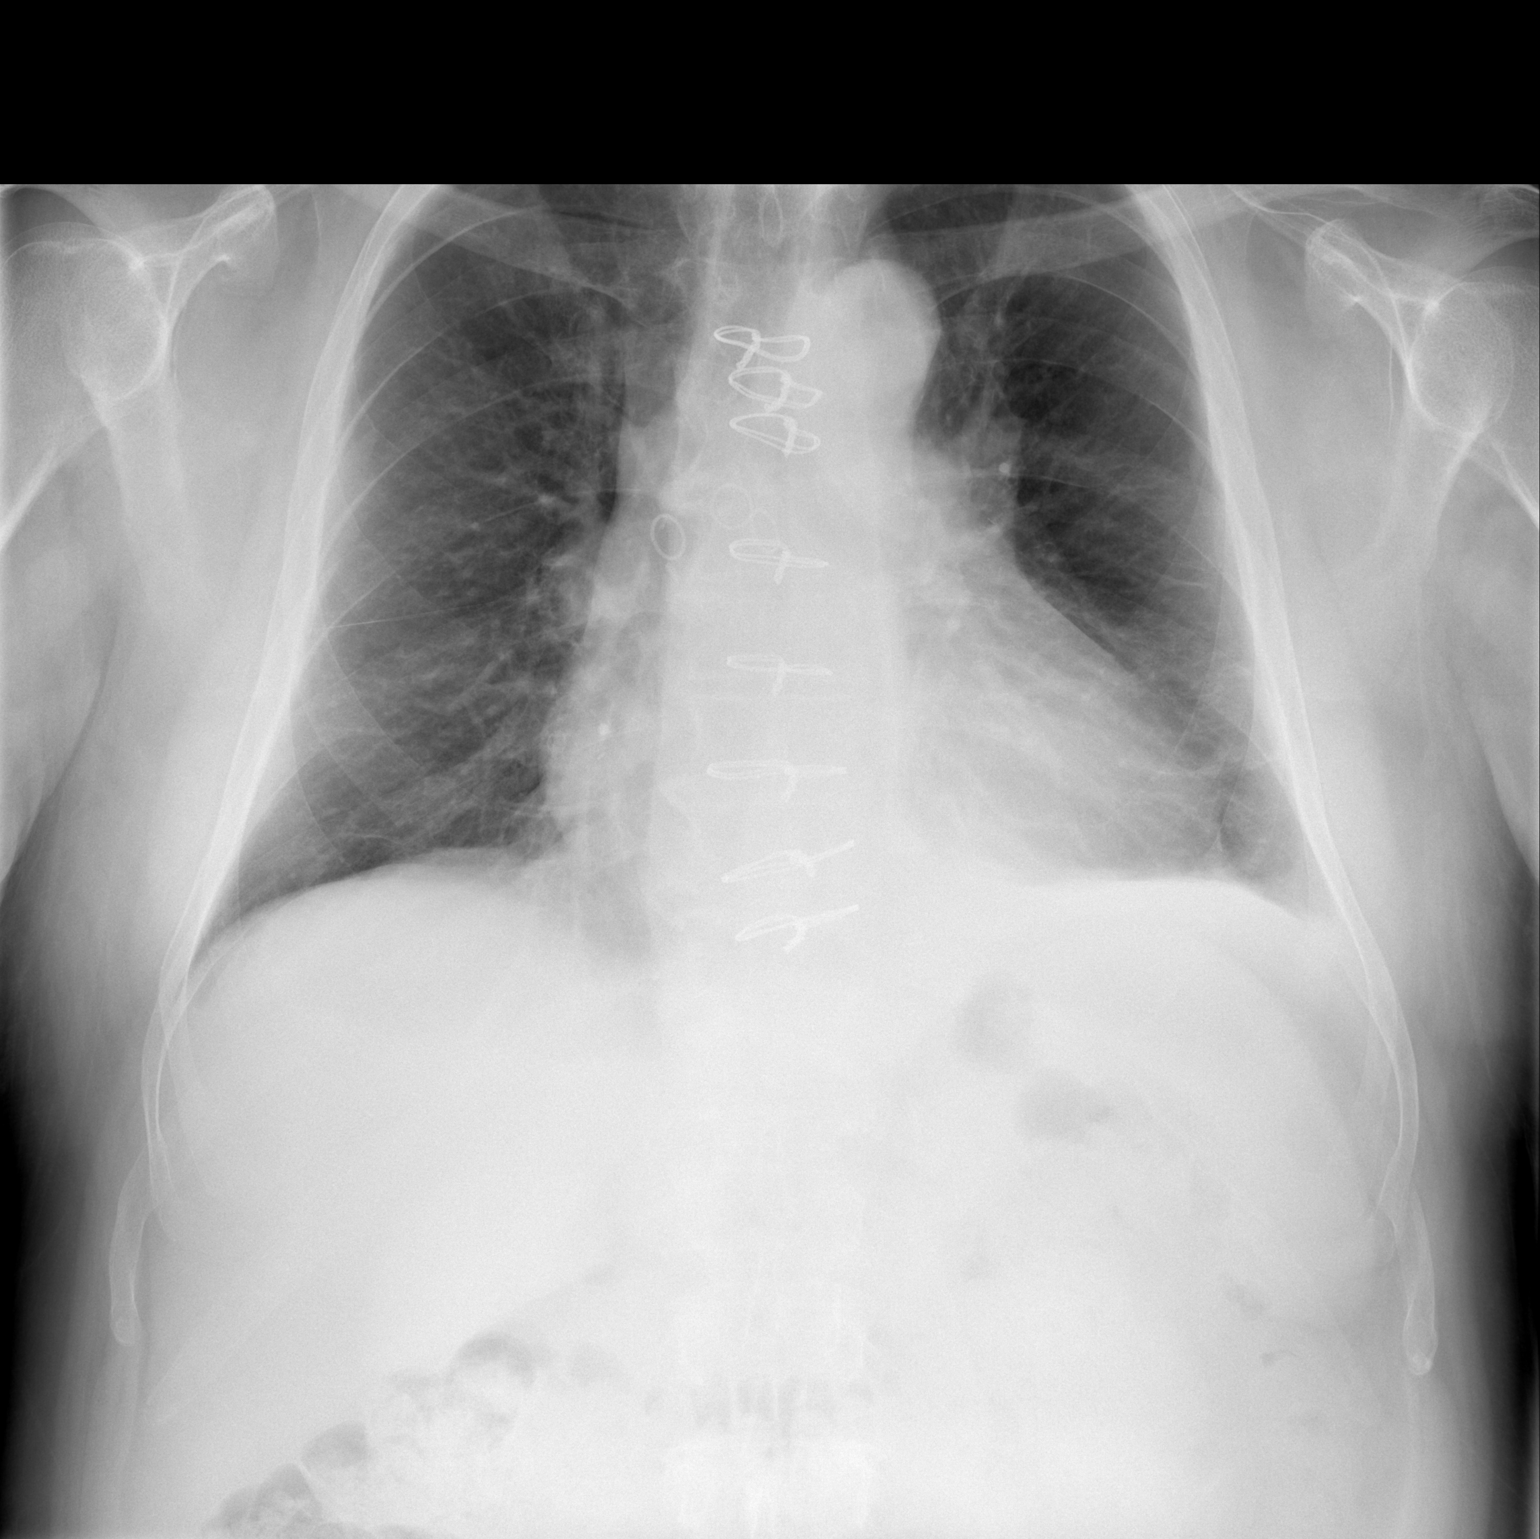

[w chest lat]
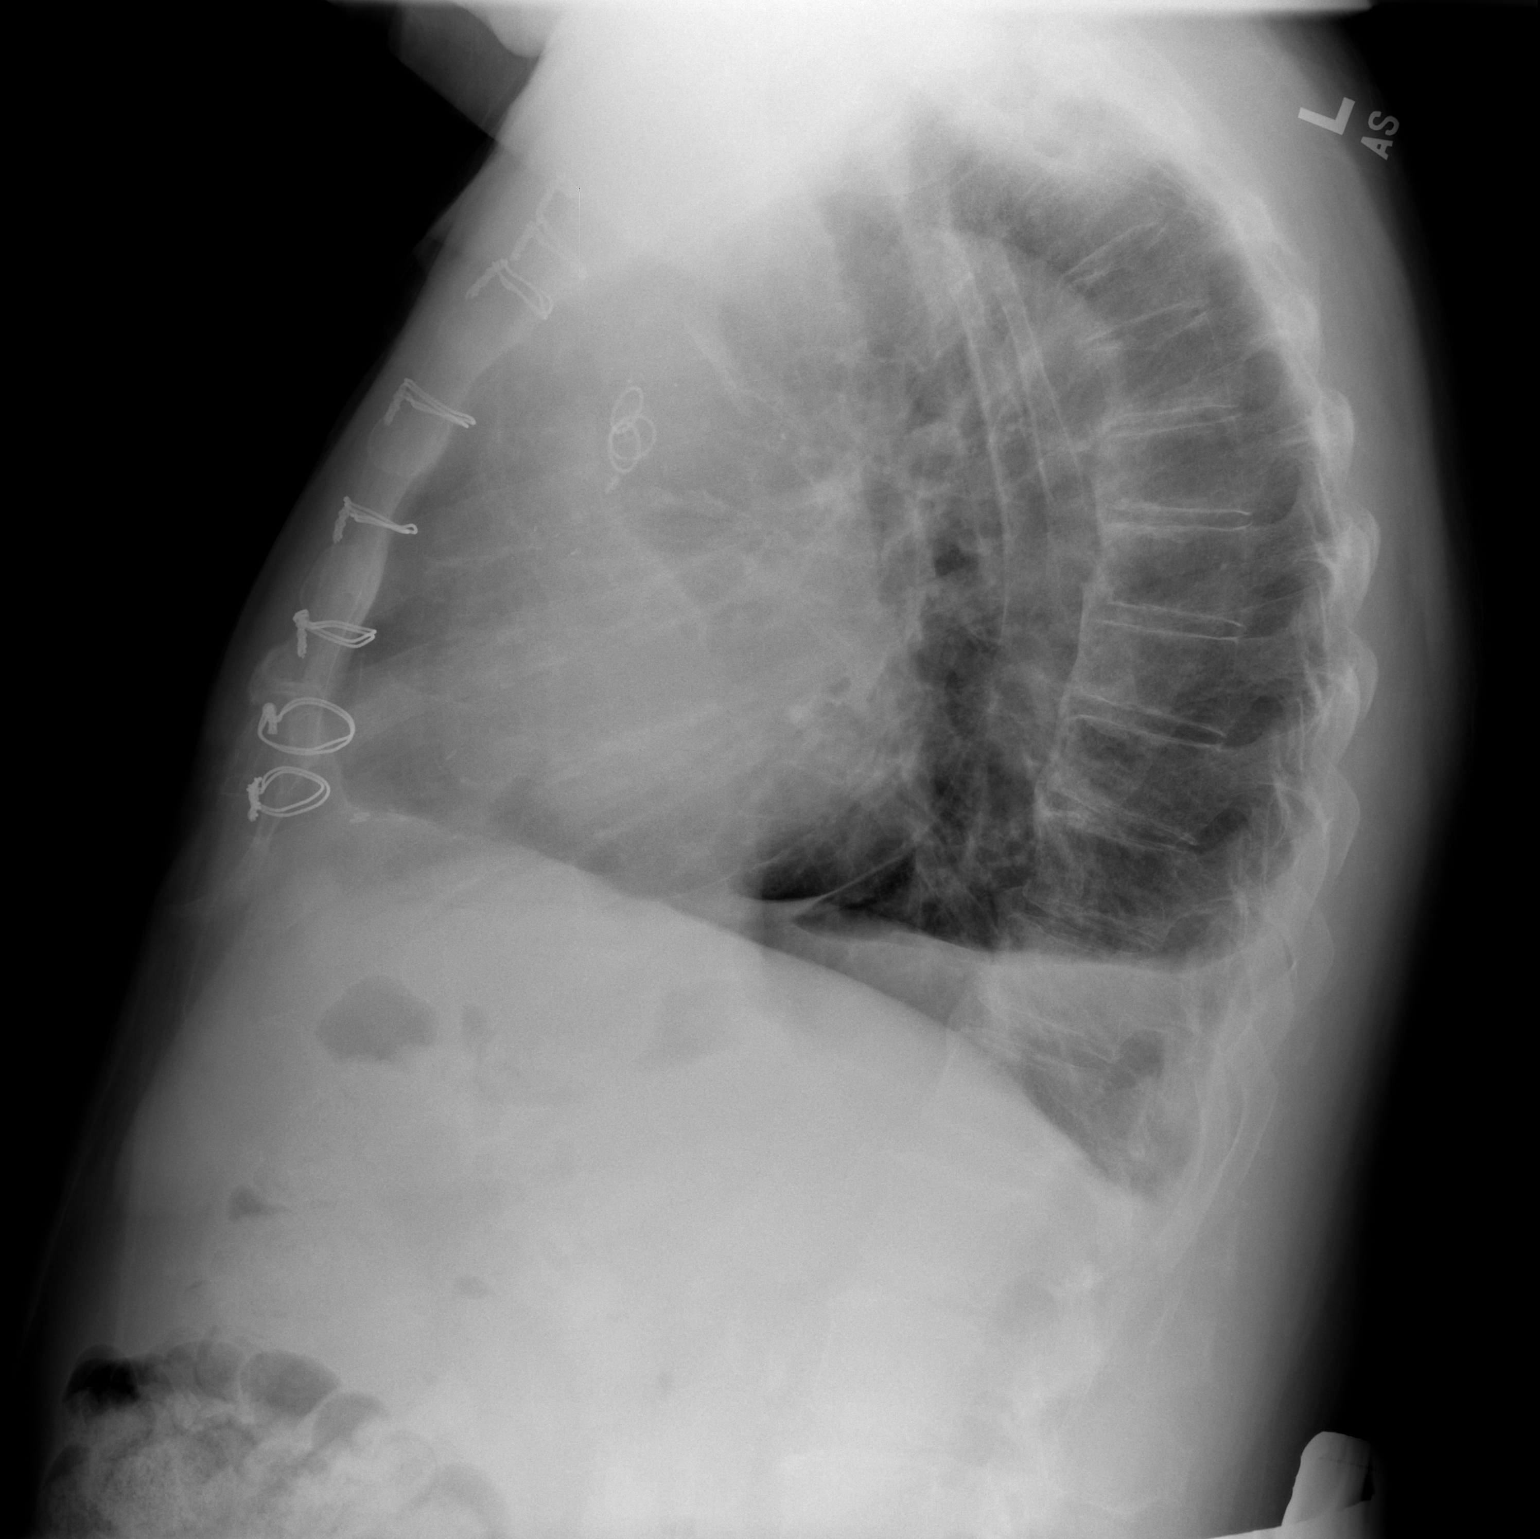

[2 of 2 positions shown; findings below may reference images not displayed]

FINDINGS: Mediastinum hilar structures normal. Cardiomegaly. Prior CABG.
Bibasilar subsegmental atelectasis and or scarring again noted .
Findings have improved from prior exam. Small left pleural effusion
cannot be excluded. No pneumothorax.
IMPRESSION: 1. Interval improvement of bibasilar subsegmental atelectasis and/or
scarring.

2.  Stable cardiomegaly.  Prior CABG.

## 2017-01-03 ENCOUNTER — Ambulatory Visit: Payer: BLUE CROSS/BLUE SHIELD | Admitting: Internal Medicine

## 2017-01-17 MED FILL — QUINAPRIL 40 MG TABLET: 40 | 30 days supply | Qty: 60 | Fill #3

## 2017-01-23 MED FILL — CARVEDILOL 25 MG TABLET: 25 | 30 days supply | Qty: 60 | Fill #3

## 2017-02-05 ENCOUNTER — Other Ambulatory Visit: Payer: Self-pay | Admitting: Cardiovascular Disease

## 2017-02-06 MED FILL — ROSUVASTATIN CALCIUM 10 MG: 10 | 30 days supply | Qty: 30 | Fill #0

## 2017-02-17 ENCOUNTER — Other Ambulatory Visit: Payer: Self-pay | Admitting: Cardiovascular Disease

## 2017-02-17 MED FILL — QUINAPRIL 40 MG TABLET: 40 | 90 days supply | Qty: 180 | Fill #0

## 2017-02-20 MED FILL — metFORMIN HCL 1000 MG TABS: 1000 | 89 days supply | Qty: 235 | Fill #0

## 2017-02-26 MED FILL — CARVEDILOL 25 MG TABLET: 25 | 30 days supply | Qty: 60 | Fill #4

## 2017-03-12 MED FILL — ROSUVASTATIN CALCIUM 10 MG: 10 | 30 days supply | Qty: 30 | Fill #1

## 2017-03-24 MED FILL — SPIRONOLACTONE 25 MG TABLET: 25 | 90 days supply | Qty: 45 | Fill #1

## 2017-03-24 MED FILL — CARVEDILOL 25 MG TABLET: 25 | 30 days supply | Qty: 60 | Fill #5

## 2017-04-17 MED FILL — ROSUVASTATIN CALCIUM 10 MG: 10 | 30 days supply | Qty: 30 | Fill #2

## 2017-05-05 MED FILL — CARVEDILOL 25 MG TABLET: 25 | 30 days supply | Qty: 60 | Fill #6

## 2017-05-27 MED FILL — QUINAPRIL 40 MG TABLET: 40 | 90 days supply | Qty: 180 | Fill #1

## 2017-05-29 MED FILL — ROSUVASTATIN CALCIUM 10 MG: 10 | 30 days supply | Qty: 30 | Fill #3

## 2017-06-03 MED FILL — CARVEDILOL 25 MG TABLET: 25 | 30 days supply | Qty: 60 | Fill #7

## 2017-06-04 MED FILL — metFORMIN HCL 1000 MG TABS: 1000 | 90 days supply | Qty: 225 | Fill #0

## 2017-06-30 MED FILL — SPIRONOLACTONE 25 MG TABLET: 25 | 30 days supply | Qty: 15 | Fill #2

## 2017-06-30 MED FILL — CARVEDILOL 25 MG TABLET: 25 | 30 days supply | Qty: 60 | Fill #8

## 2017-06-30 MED FILL — ROSUVASTATIN CALCIUM 10 MG: 10 | 30 days supply | Qty: 30 | Fill #4

## 2017-08-04 MED FILL — CARVEDILOL 25 MG TABLET: 25 | 30 days supply | Qty: 60 | Fill #9

## 2017-08-04 MED FILL — SPIRONOLACTONE 25 MG TABLET: 25 | 30 days supply | Qty: 15 | Fill #3

## 2017-08-04 MED FILL — ROSUVASTATIN CALCIUM 10 MG: 10 | 30 days supply | Qty: 30 | Fill #5

## 2017-08-26 MED FILL — QUINAPRIL 40 MG TABLET: 40 | 30 days supply | Qty: 60 | Fill #2

## 2017-09-05 MED FILL — ROSUVASTATIN CALCIUM 10 MG: 10 | 30 days supply | Qty: 30 | Fill #6

## 2017-09-05 MED FILL — SPIRONOLACTONE 25 MG TABLET: 25 | 30 days supply | Qty: 15 | Fill #4

## 2017-09-05 MED FILL — CARVEDILOL 25 MG TABLET: 25 | 30 days supply | Qty: 60 | Fill #10

## 2017-09-15 MED FILL — metFORMIN HCL 1000 MG TABS: 1000 | 30 days supply | Qty: 75 | Fill #0

## 2017-09-25 MED FILL — QUINAPRIL 40 MG TABLET: 40 | 30 days supply | Qty: 60 | Fill #3

## 2017-10-06 MED FILL — SPIRONOLACTONE 25 MG TABLET: 25 | 30 days supply | Qty: 15 | Fill #5

## 2017-10-06 MED FILL — ROSUVASTATIN CALCIUM 10 MG: 10 | 30 days supply | Qty: 30 | Fill #7

## 2017-10-06 MED FILL — CARVEDILOL 25 MG TABLET: 25 | 30 days supply | Qty: 60 | Fill #11

## 2017-10-20 MED FILL — metFORMIN HCL 1000 MG TABS: 1000 | 30 days supply | Qty: 75 | Fill #1

## 2017-10-30 MED FILL — QUINAPRIL 40 MG TABLET: 40 | 30 days supply | Qty: 60 | Fill #4

## 2017-11-12 ENCOUNTER — Other Ambulatory Visit: Payer: Self-pay | Admitting: Cardiovascular Disease

## 2017-11-12 MED FILL — CARVEDILOL 25 MG TABS: 25 | 30 days supply | Qty: 60 | Fill #0

## 2017-11-12 MED FILL — SPIRONOLACTONE 25 MG TABLET: 25 | 30 days supply | Qty: 15 | Fill #6

## 2017-11-17 ENCOUNTER — Other Ambulatory Visit: Payer: Self-pay | Admitting: Cardiovascular Disease

## 2017-11-17 MED FILL — metFORMIN HCL 1000 MG TABS: 1000 | 30 days supply | Qty: 75 | Fill #2

## 2017-11-19 ENCOUNTER — Other Ambulatory Visit: Payer: Self-pay | Admitting: *Deleted

## 2017-12-03 ENCOUNTER — Other Ambulatory Visit: Payer: Self-pay | Admitting: *Deleted

## 2017-12-03 ENCOUNTER — Other Ambulatory Visit: Payer: Self-pay | Admitting: Cardiovascular Disease

## 2017-12-03 MED ORDER — ROSUVASTATIN CALCIUM 10 MG PO TABS: 10.0000 mg | ORAL_TABLET | Freq: Every day | ORAL | 0 refills | Status: DC

## 2017-12-03 MED ORDER — QUINAPRIL HCL 40 MG PO TABS
80.0000 mg | ORAL_TABLET | Freq: Every day | ORAL | 0 refills | Status: DC
Start: 2017-12-03 — End: 2018-03-03

## 2017-12-03 MED FILL — QUINAPRIL 40 MG TABLET: 40 | 30 days supply | Qty: 60 | Fill #0

## 2017-12-03 MED FILL — ROSUVASTATIN CALCIUM 10 MG: 10 | 30 days supply | Qty: 30 | Fill #0

## 2017-12-05 ENCOUNTER — Ambulatory Visit (INDEPENDENT_AMBULATORY_CARE_PROVIDER_SITE_OTHER): Payer: BLUE CROSS/BLUE SHIELD | Admitting: Cardiovascular Disease

## 2017-12-05 ENCOUNTER — Encounter: Payer: Self-pay | Admitting: Cardiovascular Disease

## 2017-12-05 VITALS — BP 144/86 | HR 67 | Ht 70.0 in | Wt 218.0 lb

## 2017-12-05 DIAGNOSIS — I2510 Atherosclerotic heart disease of native coronary artery without angina pectoris: Secondary | ICD-10-CM

## 2017-12-05 DIAGNOSIS — I10 Essential (primary) hypertension: Secondary | ICD-10-CM

## 2017-12-05 DIAGNOSIS — E782 Mixed hyperlipidemia: Secondary | ICD-10-CM | POA: Diagnosis not present

## 2017-12-05 DIAGNOSIS — I255 Ischemic cardiomyopathy: Secondary | ICD-10-CM | POA: Diagnosis not present

## 2017-12-05 LAB — LIPID PANEL
CHOL/HDL RATIO: 6.6 ratio — AB (ref 0.0–5.0)
CHOLESTEROL TOTAL: 205 mg/dL — AB (ref 100–199)
HDL: 31 mg/dL — ABNORMAL LOW (ref >39)
LDL Calculated: 138 mg/dL — ABNORMAL HIGH (ref 0–99)
Triglycerides: 182 mg/dL — ABNORMAL HIGH (ref 0–149)
VLDL Cholesterol Cal: 36 mg/dL (ref 5–40)

## 2017-12-05 LAB — HEPATIC FUNCTION PANEL
ALK PHOS: 71 IU/L (ref 39–117)
ALT: 20 IU/L (ref 0–44)
AST: 14 IU/L (ref 0–40)
Albumin: 4.5 g/dL (ref 3.6–4.8)
BILIRUBIN TOTAL: 0.4 mg/dL (ref 0.0–1.2)
BILIRUBIN, DIRECT: 0.12 mg/dL (ref 0.00–0.40)
Total Protein: 7 g/dL (ref 6.0–8.5)

## 2017-12-05 MED ORDER — NITROGLYCERIN 0.4 MG SL SUBL: 0.4000 mg | SUBLINGUAL_TABLET | SUBLINGUAL | 5 refills | Status: DC | PRN

## 2017-12-05 MED ORDER — ASPIRIN EC 81 MG PO TBEC: 81.0000 mg | DELAYED_RELEASE_TABLET | Freq: Every day | ORAL | 3 refills | Status: AC

## 2017-12-05 MED ORDER — CARVEDILOL 25 MG PO TABS: 25.0000 mg | ORAL_TABLET | Freq: Two times a day (BID) | ORAL | 11 refills | Status: DC

## 2017-12-05 MED ORDER — SPIRONOLACTONE 25 MG PO TABS: ORAL_TABLET | ORAL | 11 refills | Status: DC

## 2017-12-05 MED FILL — SPIRONOLACTONE 25 MG TABLET: 25 | 30 days supply | Qty: 15 | Fill #0

## 2017-12-05 MED FILL — NITROGLYCERIN 0.4 MG TAB SL: 0.4 | 5 days supply | Qty: 25 | Fill #0

## 2017-12-05 MED FILL — CARVEDILOL 25 MG TABS: 25 | 30 days supply | Qty: 60 | Fill #0

## 2017-12-05 NOTE — Progress Notes (Signed)
Chief Complaint  Patient presents with  . Coronary Artery Disease     History of Present Illness: 66 yo male with history of CAD s/p CABG, HTN, HLD, DM here today for cardiac follow up. 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 There was an unsuccessful attempt at PCI of a chronically occluded LAD. He was admitted to [facility] December 2015 with unstable angina and CHF. Cardiac cath demonstrated severe three vessel CAD. He underwent 4V CABG (LIMA to LAD, SVG to RCA, SVG to OM and SVG to Diagonal). LVEF was 35% by cath in 2015. Echo in March 2016 with LVEF 20-25%. He has been seen in the EP clinic by Dr. Lovena Le but no ICD placed as he has had NYHA class 1 symptoms. 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   He is here today for follow up. The patient denies any chest pain, dyspnea, palpitations, lower extremity edema, orthopnea, PND, dizziness, near syncope or syncope.     Primary Care Physician: [name], MD  Past Medical History:  Diagnosis Date  . Anginal pain ([place])    none since 2015 [facility].  . Coronary artery disease    Dr. Cynthia Cogle,cardiology follows  . Fatty liver   . Hyperlipidemia, mixed   . Hypertension   . Hypothyroidism    no longer on meds-not in 15 yrs.  . Kidney stones    "passed on my own" (01/04/2015)  . 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 Type II diabetes mellitus ([place])     Past Surgical History:  Procedure Laterality Date  . CARDIAC CATHETERIZATION  2015/01/04   "recommending about OHS"  . COLONOSCOPY WITH PROPOFOL N/A 03/29/2016   Procedure: COLONOSCOPY WITH PROPOFOL;  Surgeon: Doran Stabler, MD;  Location: [facility];  Service: Gastroenterology;  Laterality: N/A;  . CORONARY ANGIOPLASTY  01/2004   failed LAD PCI  . CORONARY ANGIOPLASTY WITH STENT PLACEMENT  12/2003   CFX  . CORONARY ARTERY BYPASS GRAFT N/A  12/09/2014   Procedure: CORONARY ARTERY BYPASS GRAFTING (CABG), ON PUMP, TIMES FOUR, USING LEFT INTERNAL MAMMARY ARTERY, RIGHT GREATER SAPHENOUS VEIN HARVESTED ENDOSCOPICALLY;  Surgeon: Rexene Alberts, MD;  Location: [place];  Service: Open Heart Surgery;  Laterality: N/A;  . INTRAOPERATIVE TRANSESOPHAGEAL ECHOCARDIOGRAM N/A 12/09/2014   Procedure: INTRAOPERATIVE TRANSESOPHAGEAL ECHOCARDIOGRAM;  Surgeon: Rexene Alberts, MD;  Location: [place];  Service: Open Heart Surgery;  Laterality: N/A;  . LEFT HEART CATHETERIZATION WITH CORONARY ANGIOGRAM N/A 2015/01/04   Procedure: LEFT HEART CATHETERIZATION WITH CORONARY ANGIOGRAM;  Surgeon: Burnell Blanks, MD;  Location: [facility] CATH LAB;  Service: Cardiovascular;  Laterality: N/A;  . WISDOM TOOTH EXTRACTION  1980's   'all 4"    Current Outpatient Medications  Medication Sig Dispense Refill  . carvedilol (COREG) 25 MG tablet Take 1 tablet (25 mg total) by mouth 2 (two) times daily with a meal. 60 tablet 11  . fluticasone (FLONASE) 50 MCG/ACT nasal spray Place 1 spray into both nostrils daily as needed for allergies or rhinitis.    Marland Kitchen loratadine (CLARITIN) 10 MG tablet Take 10 mg by mouth every morning.     . metFORMIN (GLUCOPHAGE) 1000 MG tablet Take 1,000-1,500 mg by mouth See admin instructions. *TAKES 1000MG  IN THE MORNING AND 1500MG  IN THE EVENING*    . multivitamin (THERAGRAN)  per tablet Take 1 tablet by mouth every morning.     . nitroGLYCERIN (NITROSTAT) 0.4 MG SL tablet Place 1 tablet (0.4 mg total) under the tongue every 5 (five) minutes x 3 doses as needed for chest pain. 25 tablet 5  . quinapril (ACCUPRIL) 40 MG tablet Take 2 tablets (80 mg total) by mouth daily. Please keep appointment on 12/05/17 for further refills 180 tablet 0  . rosuvastatin (CRESTOR) 10 MG tablet Take 1 tablet (10 mg total) by mouth daily. Please keep appointment on 12/05/17 for further refills 90 tablet 0  . spironolactone (ALDACTONE) 25 MG tablet TAKE 12.5 MG BY MOUTH  ONCE DAILY 15 tablet 11  . aspirin EC 81 MG tablet Take 1 tablet (81 mg total) by mouth daily. 90 tablet 3   No current facility-administered medications for this visit.     No Known Allergies  Social History   Socioeconomic History  . Marital status: Single    Spouse name: Not on file  . Number of children: 2  . Years of education: Not on file  . Highest education level: Not on file  Social Needs  . Financial resource strain: Not on file  . Food insecurity - worry: Not on file  . Food insecurity - inability: Not on file  . Transportation needs - medical: Not on file  . Transportation needs - non-medical: Not on file  Occupational History  . Occupation: Statistician: OTHER    Employer: [employer]  Tobacco Use  . Smoking status: Former Smoker    Packs/day: 0.50    Years: 30.00    Pack years: 15.00    Types: Cigarettes  . Smokeless tobacco: Never Used  . Tobacco comment: "quit smoking cigarettes in ~ 2007"  Substance and Sexual Activity  . Alcohol use: No  . Drug use: No  . Sexual activity: Yes  Other Topics Concern  . Not on file  Social History Narrative  . Not on file    Family History  Problem Relation Age of Onset  . Coronary artery disease Father 74  . Hypertension Mother   . Heart attack Paternal Grandfather        x2 (MI at age 67 was his cause of death)    Review of Systems:  As stated in the HPI and otherwise negative.   BP (!) 144/86   Pulse 67   Ht 5\' 10"  (1.778 m)   Wt 218 lb (98.9 kg)   SpO2 98%   BMI 31.28 kg/m   Physical Examination:  General: Well developed, well nourished, NAD  HEENT: OP clear, mucus membranes moist  SKIN: warm, dry. No rashes. Neuro: No focal deficits  Musculoskeletal: Muscle strength 5/5 all ext  Psychiatric: Mood and affect normal  Neck: No JVD, no carotid bruits, no thyromegaly, no lymphadenopathy.  Lungs:Clear bilaterally, no wheezes, rhonci, crackles Cardiovascular: Regular rate and  rhythm. No murmurs, gallops or rubs. Abdomen:Soft. Bowel sounds present. Non-tender.  Extremities: No lower extremity edema. Pulses are 2 + in the bilateral DP/PT.  Echo October 2017: Left ventricle: The cavity size was moderately dilated. Systolic   function was severely reduced. The estimated ejection fraction   was in the range of 25% to 30%. There is akinesis of the   inferolateral, inferior, and inferoseptal myocardium. Doppler   parameters are consistent with abnormal left ventricular   relaxation (grade 1 diastolic dysfunction). - Aorta: Ascending aortic diameter: 41 mm (S). - Ascending aorta: The ascending  aorta was mildly dilated. - Left atrium: The atrium was moderately dilated. - Right ventricle: The cavity size was mildly dilated. Wall   thickness was normal. - Tricuspid valve: There was trivial regurgitation.  EKG:  EKG is ordered today. The ekg ordered today demonstrates NSR, rate 67 bpm. Old inferior MI. Diffuse T wave flattening, unchanged.   Recent Labs: 12/21/2016: ALT 28; BUN 16; Creatinine, Ser 1.13; Hemoglobin 15.8; Platelets 177; Potassium 4.6; Sodium 139   Lipid Panel    Component Value Date/Time   CHOL 115 02/10/2015 0834   TRIG 123.0 02/10/2015 0834   HDL 26.90 (L) 02/10/2015 0834   CHOLHDL 4 02/10/2015 0834   VLDL 24.6 02/10/2015 0834   LDLCALC 64 02/10/2015 0834     Wt Readings from Last 3 Encounters:  12/05/17 218 lb (98.9 kg)  12/21/16 210 lb (95.3 kg)  10/18/16 217 lb 9.6 oz (98.7 kg)     Other studies Reviewed: Additional studies/ records that were reviewed today include: . Review of the above records demonstrates:    Assessment and Plan:   1. CAD s/p CABG without angina: No chest pain suggestive of angina. He is s/p CABG December 2015. Continue ASA, statin and beta blocker.  He will lower his ASA to 81 mg daily.   2. HYPERTENSION: BP is controlled at home. He has been out of meds for three days. BP slightly elevated this am. No  changes. Will refill meds.   3. Ischemic cardiomyopathy: LVEF=20-25% s/p CABG December 2015. He has been seen by Dr. Lovena Le and there has been a discussion regarding an ICD but he is still working with high voltage so ICD is not recommended. He has NYHA class 1 symptoms.   Will continue beta blocker and Ace-inh. Continue aldactone. I will not start Entresto since he is NYHA class 1.    4. Hyperlipidemia: No recent lipids in primary care. Continue statin. Check lipids and LFTs today.   Current medicines are reviewed at length with the patient today.  The patient does not have concerns regarding medicines.  The following changes have been made:  no change  Labs/ tests ordered today include:   Orders Placed This Encounter  Procedures  . Lipid Profile  . Hepatic function panel  . EKG 12-Lead    Disposition:   FU with me in 12 months  Signed, Lauree Chandler, MD 12/05/2017 11:54 AM    [facility] [address], [place], [place]  [address] Phone: [phone]; Fax: [phone]

## 2017-12-05 NOTE — Patient Instructions (Addendum)
Medication Instructions:  Your physician has recommended you make the following change in your medication: Decrease aspirin to 81 mg by mouth daily    Labwork: Lab work to be done today--Lipid and liver profiles  Testing/Procedures: none  Follow-Up: Your physician recommends that you schedule a follow-up appointment in: 12 months. Please call our office in about 9 months to schedule this appointment    Any Other Special Instructions Will Be Listed Below (If Applicable).     If you need a refill on your cardiac medications before your next appointment, please call your pharmacy.

## 2017-12-11 ENCOUNTER — Other Ambulatory Visit: Payer: Self-pay | Admitting: *Deleted

## 2017-12-11 DIAGNOSIS — E782 Mixed hyperlipidemia: Secondary | ICD-10-CM

## 2017-12-22 MED FILL — metFORMIN HCL 1000 MG TABS: 1000 | 30 days supply | Qty: 75 | Fill #3

## 2018-01-01 MED FILL — QUINAPRIL 40 MG TABLET: 40 | 30 days supply | Qty: 60 | Fill #1

## 2018-01-05 MED FILL — ROSUVASTATIN CALCIUM 10 MG: 10 | 30 days supply | Qty: 30 | Fill #0

## 2018-01-21 MED FILL — SPIRONOLACTONE 25 MG TABLET: 25 | 30 days supply | Qty: 15 | Fill #1

## 2018-01-21 MED FILL — CARVEDILOL 25 MG TABS: 25 | 30 days supply | Qty: 60 | Fill #1

## 2018-01-21 MED FILL — metFORMIN HCL 1000 MG TABS: 1000 | 30 days supply | Qty: 75 | Fill #4

## 2018-02-04 MED FILL — QUINAPRIL 40 MG TABLET: 40 | 30 days supply | Qty: 60 | Fill #2

## 2018-02-04 MED FILL — ROSUVASTATIN CALCIUM 10 MG: 10 | 30 days supply | Qty: 30 | Fill #1

## 2018-02-19 MED FILL — CARVEDILOL 25 MG TABS: 25 | 30 days supply | Qty: 60 | Fill #2

## 2018-02-19 MED FILL — SPIRONOLACTONE 25 MG TABLET: 25 | 30 days supply | Qty: 15 | Fill #2

## 2018-02-26 MED FILL — metFORMIN HCL 1000 MG TABS: 1000 | 30 days supply | Qty: 75 | Fill #5

## 2018-02-27 ENCOUNTER — Other Ambulatory Visit: Payer: BLUE CROSS/BLUE SHIELD | Admitting: *Deleted

## 2018-02-27 DIAGNOSIS — E782 Mixed hyperlipidemia: Secondary | ICD-10-CM | POA: Diagnosis not present

## 2018-02-27 LAB — LIPID PANEL
CHOL/HDL RATIO: 3.2 ratio (ref 0.0–5.0)
Cholesterol, Total: 120 mg/dL (ref 100–199)
HDL: 37 mg/dL — ABNORMAL LOW (ref >39)
LDL Calculated: 57 mg/dL (ref 0–99)
TRIGLYCERIDES: 128 mg/dL (ref 0–149)
VLDL Cholesterol Cal: 26 mg/dL (ref 5–40)

## 2018-02-27 LAB — HEPATIC FUNCTION PANEL
ALBUMIN: 4.7 g/dL (ref 3.6–4.8)
ALT: 17 IU/L (ref 0–44)
AST: 16 IU/L (ref 0–40)
Alkaline Phosphatase: 69 IU/L (ref 39–117)
BILIRUBIN TOTAL: 0.7 mg/dL (ref 0.0–1.2)
BILIRUBIN, DIRECT: 0.19 mg/dL (ref 0.00–0.40)
TOTAL PROTEIN: 7.1 g/dL (ref 6.0–8.5)

## 2018-03-03 ENCOUNTER — Other Ambulatory Visit: Payer: Self-pay | Admitting: Cardiovascular Disease

## 2018-03-03 MED FILL — QUINAPRIL 40 MG TABLET: 40 | 90 days supply | Qty: 180 | Fill #0

## 2018-03-03 MED FILL — ROSUVASTATIN CALCIUM 10 MG: 10 | 30 days supply | Qty: 30 | Fill #2

## 2018-03-25 MED FILL — CARVEDILOL 25 MG TABS: 25 | 30 days supply | Qty: 60 | Fill #3

## 2018-03-25 MED FILL — SPIRONOLACTONE 25 MG TABLET: 25 | 30 days supply | Qty: 15 | Fill #3

## 2018-04-06 ENCOUNTER — Other Ambulatory Visit: Payer: Self-pay | Admitting: Cardiovascular Disease

## 2018-04-06 MED FILL — metFORMIN HCL 1000 MG TABS: 1000 | 4 days supply | Qty: 10 | Fill #0

## 2018-04-06 MED FILL — ROSUVASTATIN CALCIUM 10 MG: 10 | 90 days supply | Qty: 90 | Fill #0

## 2018-04-09 ENCOUNTER — Telehealth: Payer: Self-pay | Admitting: Cardiovascular Disease

## 2018-04-09 NOTE — Telephone Encounter (Signed)
I spoke with pt who is asking if we checked A1c on last blood work. I told him we did not check this.

## 2018-04-09 NOTE — Telephone Encounter (Signed)
Patient calling, would like to discuss lab results

## 2018-04-13 MED FILL — metFORMIN HCL 1000 MG TABS: 1000 | 6 days supply | Qty: 15 | Fill #0

## 2018-04-17 DIAGNOSIS — E119 Type 2 diabetes mellitus without complications: Secondary | ICD-10-CM | POA: Diagnosis not present

## 2018-04-20 MED FILL — metFORMIN HCL 1000 MG TABS: 1000 | 90 days supply | Qty: 225 | Fill #0

## 2018-04-30 MED FILL — CARVEDILOL 25 MG TABS: 25 | 30 days supply | Qty: 60 | Fill #4

## 2018-04-30 MED FILL — SPIRONOLACTONE 25 MG TABLET: 25 | 30 days supply | Qty: 15 | Fill #4

## 2018-06-04 MED FILL — CARVEDILOL 25 MG TABS: 25 | 30 days supply | Qty: 60 | Fill #5

## 2018-06-04 MED FILL — SPIRONOLACTONE 25 MG TABLET: 25 | 30 days supply | Qty: 15 | Fill #5

## 2018-06-05 MED FILL — QUINAPRIL 40 MG TABLET: 40 | 90 days supply | Qty: 180 | Fill #1

## 2018-07-08 MED FILL — ROSUVASTATIN CALCIUM 10 MG: 10 | 90 days supply | Qty: 90 | Fill #1

## 2018-07-08 MED FILL — CARVEDILOL 25 MG TABLET: 25 | 30 days supply | Qty: 60 | Fill #6

## 2018-07-08 MED FILL — SPIRONOLACTONE 25 MG TABS: 25 | 30 days supply | Qty: 15 | Fill #6

## 2018-07-31 DIAGNOSIS — I2510 Atherosclerotic heart disease of native coronary artery without angina pectoris: Secondary | ICD-10-CM | POA: Diagnosis not present

## 2018-07-31 DIAGNOSIS — Z125 Encounter for screening for malignant neoplasm of prostate: Secondary | ICD-10-CM | POA: Diagnosis not present

## 2018-07-31 DIAGNOSIS — I10 Essential (primary) hypertension: Secondary | ICD-10-CM | POA: Diagnosis not present

## 2018-07-31 DIAGNOSIS — E785 Hyperlipidemia, unspecified: Secondary | ICD-10-CM | POA: Diagnosis not present

## 2018-07-31 DIAGNOSIS — E119 Type 2 diabetes mellitus without complications: Secondary | ICD-10-CM | POA: Diagnosis not present

## 2018-08-03 MED FILL — metFORMIN HCL 1000 MG TABS: 1000 | 90 days supply | Qty: 225 | Fill #1

## 2018-08-06 MED FILL — SPIRONOLACTONE 25 MG TABS: 25 | 30 days supply | Qty: 15 | Fill #7

## 2018-08-11 MED FILL — CARVEDILOL 25 MG TABLET: 25 | 30 days supply | Qty: 60 | Fill #7

## 2018-08-17 DIAGNOSIS — H43391 Other vitreous opacities, right eye: Secondary | ICD-10-CM | POA: Diagnosis not present

## 2018-09-11 DIAGNOSIS — E119 Type 2 diabetes mellitus without complications: Secondary | ICD-10-CM | POA: Diagnosis not present

## 2018-09-11 MED FILL — CARVEDILOL 25 MG TABLET: 25 | 30 days supply | Qty: 60 | Fill #8

## 2018-09-11 MED FILL — QUINAPRIL 40 MG TABLET: 40 | 90 days supply | Qty: 180 | Fill #2

## 2018-09-11 MED FILL — SPIRONOLACTONE 25 MG TABS: 25 | 30 days supply | Qty: 15 | Fill #8

## 2018-10-06 MED FILL — LATANOPROST 0.005% EYE DRP: 0.005 | 75 days supply | Qty: 8 | Fill #0

## 2018-10-16 DIAGNOSIS — H401131 Primary open-angle glaucoma, bilateral, mild stage: Secondary | ICD-10-CM | POA: Diagnosis not present

## 2018-10-19 MED FILL — CARVEDILOL 25 MG TABLET: 25 | 30 days supply | Qty: 60 | Fill #9

## 2018-10-19 MED FILL — SPIRONOLACTONE 25 MG TABS: 25 | 30 days supply | Qty: 15 | Fill #9

## 2018-10-22 MED FILL — ROSUVASTATIN CALCIUM 10 MG: 10 | 90 days supply | Qty: 90 | Fill #2

## 2018-11-03 ENCOUNTER — Encounter: Payer: Self-pay | Admitting: Cardiology

## 2018-11-11 ENCOUNTER — Telehealth: Payer: Self-pay | Admitting: Cardiovascular Disease

## 2018-11-11 MED FILL — metFORMIN HCL 1000 MG TABS: 1000 | 90 days supply | Qty: 225 | Fill #0

## 2018-11-11 NOTE — Telephone Encounter (Signed)
Called pt's pharmacy to inform them that pt must refill with PCP. Pharmacy tech verbalized understanding.

## 2018-11-11 NOTE — Telephone Encounter (Signed)
[facility] is requesting a refill on metformin 1000 mg tablet. Would Dr. Angelena Form like to refill this medication? Please address

## 2018-11-11 NOTE — Telephone Encounter (Signed)
Please send to pt's primary care provider

## 2018-11-13 DIAGNOSIS — H43813 Vitreous degeneration, bilateral: Secondary | ICD-10-CM | POA: Diagnosis not present

## 2018-11-13 DIAGNOSIS — H35462 Secondary vitreoretinal degeneration, left eye: Secondary | ICD-10-CM | POA: Diagnosis not present

## 2018-11-13 DIAGNOSIS — H33322 Round hole, left eye: Secondary | ICD-10-CM | POA: Diagnosis not present

## 2018-11-13 DIAGNOSIS — H43393 Other vitreous opacities, bilateral: Secondary | ICD-10-CM | POA: Diagnosis not present

## 2018-11-17 MED FILL — CARVEDILOL 25 MG TABLET: 25 | 30 days supply | Qty: 60 | Fill #10

## 2018-11-25 MED FILL — SPIRONOLACTONE 25 MG TABS: 25 | 30 days supply | Qty: 15 | Fill #10

## 2018-12-18 ENCOUNTER — Ambulatory Visit: Payer: BLUE CROSS/BLUE SHIELD | Admitting: Cardiology

## 2018-12-21 ENCOUNTER — Other Ambulatory Visit: Payer: Self-pay | Admitting: Cardiovascular Disease

## 2018-12-21 MED FILL — SPIRONOLACTONE 25 MG TABS: 25 | 30 days supply | Qty: 15 | Fill #0

## 2018-12-21 MED FILL — QUINAPRIL 40 MG TABLET: 40 | 90 days supply | Qty: 180 | Fill #0

## 2018-12-21 MED FILL — CARVEDILOL 25 MG TABLET: 25 | 30 days supply | Qty: 60 | Fill #0

## 2018-12-28 DIAGNOSIS — E119 Type 2 diabetes mellitus without complications: Secondary | ICD-10-CM | POA: Diagnosis not present

## 2018-12-29 DIAGNOSIS — E119 Type 2 diabetes mellitus without complications: Secondary | ICD-10-CM | POA: Diagnosis not present

## 2019-01-01 ENCOUNTER — Encounter

## 2019-01-01 ENCOUNTER — Ambulatory Visit: Payer: BLUE CROSS/BLUE SHIELD | Admitting: Physician Assistant

## 2019-01-01 ENCOUNTER — Encounter: Payer: Self-pay | Admitting: Physician Assistant

## 2019-01-01 VITALS — BP 170/102 | HR 70 | Ht 70.0 in | Wt 228.0 lb

## 2019-01-01 DIAGNOSIS — I5022 Chronic systolic (congestive) heart failure: Secondary | ICD-10-CM | POA: Diagnosis not present

## 2019-01-01 DIAGNOSIS — I10 Essential (primary) hypertension: Secondary | ICD-10-CM

## 2019-01-01 DIAGNOSIS — R011 Cardiac murmur, unspecified: Secondary | ICD-10-CM

## 2019-01-01 DIAGNOSIS — E785 Hyperlipidemia, unspecified: Secondary | ICD-10-CM | POA: Diagnosis not present

## 2019-01-01 DIAGNOSIS — I2510 Atherosclerotic heart disease of native coronary artery without angina pectoris: Secondary | ICD-10-CM | POA: Diagnosis not present

## 2019-01-01 MED ORDER — CARVEDILOL 25 MG PO TABS: 25.0000 mg | ORAL_TABLET | Freq: Two times a day (BID) | ORAL | 3 refills | Status: DC

## 2019-01-01 MED ORDER — QUINAPRIL HCL 40 MG PO TABS: 80.0000 mg | ORAL_TABLET | Freq: Every day | ORAL | 3 refills | Status: DC

## 2019-01-01 MED ORDER — QUINAPRIL HCL 40 MG PO TABS: 80.0000 mg | ORAL_TABLET | Freq: Every day | ORAL | 0 refills | Status: DC

## 2019-01-01 MED ORDER — SPIRONOLACTONE 25 MG PO TABS: 25.0000 mg | ORAL_TABLET | Freq: Every day | ORAL | 3 refills | Status: DC

## 2019-01-01 MED ORDER — ROSUVASTATIN CALCIUM 10 MG PO TABS: ORAL_TABLET | ORAL | 3 refills | Status: DC

## 2019-01-01 NOTE — Progress Notes (Signed)
Cardiology Office Note:    Date:  01/01/2019   ID:  Zachary Ellis, DOB 04-03-1951, MRN [id number]  PCP:  [name], MD  Cardiologist:  Lauree Chandler, MD    Electrophysiologist:  None   Referring MD: [name], *   Chief Complaint  Patient presents with  . Follow-up    CAD, CHF     History of Present Illness:    Zachary Ellis is a 68 y.o. male with coronary artery disease status post myocardial infarction in 2005 treated with a drug-eluting stent to the LCx and unsuccessful attempted PCI of a chronically occluded LAD and subsequent CABG in December 8546, chronic systolic heart failure secondary to ischemic cardiomyopathy, hypertension, hyperlipidemia, diabetes.  EF by echocardiogram in 2017 was 25-30%.  The patient was evaluated by Dr. Lovena Le for ICD implantation.  He did not meet indications for ICD implant due to NYHA 1 symptoms.  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  He was last seen by Dr. Angelena Form in December 2018.   Zachary Ellis returns for follow up on coronary artery disease and congestive heart failure.  He is here alone.  He remains active without limitations.  He still works with [employer].  He has not had any chest pain, shortness of breath, syncope, paroxysmal nocturnal dyspnea, leg swelling.  He did run out of his BP meds for 3 days a couple weeks ago.  He is back on all of them now. His BP is usually better at home and is elevated in clinic.  He has not tracked his BP in a while.    Prior CV studies:   The following studies were reviewed today:  Echocardiogram 10/04/2016 EF 25-30, inferolateral/inferior/inferoseptal akinesis, grade 1 diastolic dysfunction, mildly dilated ascending aorta (41 mm), moderate LAE, mild RVE, trivial TR  Past Medical History:  Diagnosis Date  . Chronic systolic heart failure ([place]) 09/08/2015   2/2 Ischemic CM // Echo 09/2016 - EF 25-30, inferolateral/inferior/inferoseptal akinesis, grade 1 diastolic  dysfunction, mildly dilated ascending aorta (41 mm), moderate LAE, mild RVE, trivial TR // Eval by EP (Dr. 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 Coronary artery disease    s/p MI in 2005 tx with DES to LCx, unsuccessful PCI to LAD CTO // s/p CABG in 11/2014 (L-LAD, S-RCA, S-OM, S-Dx)   . Fatty liver   . Hyperlipidemia, mixed   . Hypertension   . Hypothyroidism    no longer on meds-not in 15 yrs.  . Kidney stones    "passed on my own" (12/11/2014)  . 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 Type II diabetes mellitus ([place])    Surgical Hx: The patient  has a past surgical history that includes [place] tooth extraction (1980's); Coronary angioplasty with stent (12/2003); Coronary angioplasty (01/2004); Cardiac catheterization (12-11-14); left heart catheterization with coronary angiogram (N/A, 2014-12-11); Coronary artery bypass graft (N/A, 12/09/2014); Intraoprative transesophageal echocardiogram (N/A, 12/09/2014); and Colonoscopy with propofol (N/A, 03/29/2016).   Current Medications: Current Meds  Medication Sig  . aspirin EC 81 MG tablet Take 1 tablet (81 mg total) by mouth daily.  . carvedilol (COREG) 25 MG tablet Take 1 tablet (25 mg total) by mouth 2 (two) times daily with a meal.  . fluticasone (FLONASE) 50 MCG/ACT nasal spray Place 1 spray into both nostrils daily as  needed for allergies or rhinitis.  Marland Kitchen loratadine (CLARITIN) 10 MG tablet Take 10 mg by mouth every morning.   . metFORMIN (GLUCOPHAGE) 1000 MG tablet Take 1,000-1,500 mg by mouth See admin instructions. *TAKES 1000MG  IN THE MORNING AND 1500MG  IN THE EVENING*  . multivitamin (THERAGRAN) per tablet Take 1 tablet by mouth every morning.   . nitroGLYCERIN (NITROSTAT) 0.4 MG SL tablet Place 1 tablet (0.4 mg total) under the tongue every 5 (five) minutes x 3 doses as needed for chest pain.  Marland Kitchen quinapril (ACCUPRIL) 40 MG tablet Take 2  tablets (80 mg total) by mouth daily.  . rosuvastatin (CRESTOR) 10 MG tablet TAKE 1 TABLET BY MOUTH ONCE DAILY.  . [DISCONTINUED] carvedilol (COREG) 25 MG tablet Take 1 tablet (25 mg total) by mouth 2 (two) times daily with a meal. Please keep upcoming appt for future refills. Thank you.  . [DISCONTINUED] quinapril (ACCUPRIL) 40 MG tablet Take 2 tablets (80 mg total) by mouth daily. Please keep upcoming appt for future refills. Thank you.  . [DISCONTINUED] rosuvastatin (CRESTOR) 10 MG tablet TAKE 1 TABLET BY MOUTH ONCE DAILY. PLEASE KEEP APPT ON 12/05/17 FOR FURTHER REFILLS  . [DISCONTINUED] spironolactone (ALDACTONE) 25 MG tablet TAKE 1/2 TABLET BY MOUTH ONCE DAILY. Please keep upcoming appt for future refills. Thank you.     Allergies:   Patient has no known allergies.   Social History   Tobacco Use  . Smoking status: Former Smoker    Packs/day: 0.50    Years: 30.00    Pack years: 15.00    Types: Cigarettes  . Smokeless tobacco: Never Used  . Tobacco comment: "quit smoking cigarettes in ~ 2007"  Substance Use Topics  . Alcohol use: No  . Drug use: No     Family Hx: The patient's family history includes Coronary artery disease (age of onset: 5) in his father; Heart attack in his paternal grandfather; Hypertension in his mother.  ROS:   Please see the history of present illness.    ROS All other systems reviewed and are negative.   EKGs/Labs/Other Test Reviewed:    EKG:  EKG is ordered today.  The ekg ordered today demonstrates NSR, HR 70, inf Q waves, QTc 451, no change from prior tracing.   Recent Labs: 02/27/2018: ALT 17   Recent Lipid Panel Lab Results  Component Value Date/Time   CHOL 120 02/27/2018 08:24 AM   TRIG 128 02/27/2018 08:24 AM   HDL 37 (L) 02/27/2018 08:24 AM   CHOLHDL 3.2 02/27/2018 08:24 AM   CHOLHDL 4 02/10/2015 08:34 AM   LDLCALC 57 02/27/2018 08:24 AM   From K[facility] Tool: Cholesterol, total 120.000 02/27/2018 HDL 37.000 02/27/2018 LDL 57.000  02/27/2018 Triglycerides 128.000 02/27/2018 Hemoglobin 15.800 12/21/2016 Creatinine, Serum 1.130 12/21/2016 Potassium 4.600 12/21/2016 ALT (SGPT) 17.000 02/27/2018 Platelets 177.000 12/21/2016   Physical Exam:    VS:  BP (!) 170/102   Pulse 70   Ht 5\' 10"  (1.778 m)   Wt 228 lb (103.4 kg)   SpO2 97%   BMI 32.71 kg/m     Wt Readings from Last 3 Encounters:  01/01/19 228 lb (103.4 kg)  12/05/17 218 lb (98.9 kg)  12/21/16 210 lb (95.3 kg)     Physical Exam  Constitutional: He is oriented to person, place, and time. He appears well-developed and well-nourished. No distress.  HENT:  Head: Normocephalic and atraumatic.  Eyes: No scleral icterus.  Neck: Neck supple. No JVD present. No thyromegaly present.  Cardiovascular: Normal rate,  regular rhythm, S1 normal and S2 normal.  Murmur heard.  Holosystolic murmur is present with a grade of 2/6 at the upper left sternal border and lower left sternal border. Pulmonary/Chest: Breath sounds normal. He has no rales.  Abdominal: Soft. There is no hepatomegaly.  Musculoskeletal:        General: No edema.  Lymphadenopathy:    He has no cervical adenopathy.  Neurological: He is alert and oriented to person, place, and time.  Skin: Skin is warm and dry.  Psychiatric: He has a normal mood and affect.    ASSESSMENT & PLAN:    Coronary artery disease involving native coronary artery of native heart without angina pectoris  Hx of MI in 2005 tx with DES to LCx and subsequent CABG in 2015.  He is doing well without angina.  Continue ASA, statin, beta-blocker.  Chronic systolic heart failure ([facility])  2/2 Ischemic CM.  EF 25-30.  NYHA 1.  Volume status is normal.  He is on a good medical regimen which includes beta-blocker, ACE inhibitor, spironolactone.  No indication for Entresto as he is NYHA 1.  Continue current management.   Essential hypertension BP markedly elevated.  He notes his pressure is always elevated in clinic.  I recommend increasing  his dose of Spironolactone for now.  I will have him check his BP over the next week and return for a RN visit to recheck his BP and check his BP with his own machine.  If BP above target, add hydralazine 25 mg TID.  Check BMET 1 week. Plan follow up with Dr. Lauree Chandler in 1 year.  If BP med adjustments have to be made, I will bring him back to see me earlier for follow up.    Hyperlipidemia, unspecified hyperlipidemia type LDL optimal on most recent lab work.  Continue current Rx.    Murmur  Holosystolic murmur on exam.  No symptoms to suggest significant valve dz.  He only had trivial TR on last echo in 2017.  Will obtain follow up echocardiogram.    Dispo:  Return in about 1 year (around 01/02/2020) for Routine Follow Up w/ Dr. Angelena Form.   Medication Adjustments/Labs and Tests Ordered: Current medicines are reviewed at length with the patient today.  Concerns regarding medicines are outlined above.  Tests Ordered: Orders Placed This Encounter  Procedures  . Basic metabolic panel  . EKG 12-Lead  . ECHOCARDIOGRAM COMPLETE   Medication Changes: Meds ordered this encounter  Medications  . carvedilol (COREG) 25 MG tablet    Sig: Take 1 tablet (25 mg total) by mouth 2 (two) times daily with a meal.    Dispense:  180 tablet    Refill:  3  . DISCONTD: quinapril (ACCUPRIL) 40 MG tablet    Sig: Take 2 tablets (80 mg total) by mouth daily. Please keep upcoming appt for future refills. Thank you.    Dispense:  180 tablet    Refill:  0  . rosuvastatin (CRESTOR) 10 MG tablet    Sig: TAKE 1 TABLET BY MOUTH ONCE DAILY.    Dispense:  90 tablet    Refill:  3  . spironolactone (ALDACTONE) 25 MG tablet    Sig: Take 1 tablet (25 mg total) by mouth daily.    Dispense:  90 tablet    Refill:  3  . quinapril (ACCUPRIL) 40 MG tablet    Sig: Take 2 tablets (80 mg total) by mouth daily.    Dispense:  180 tablet  Refill:  3    Please update Rx - refills for 1 year    Order Specific  Question:   Supervising Provider    Answer:   Deboraha Sprang [[id number]]    Signed, Richardson Dopp, PA-C  01/01/2019 1:28 PM    [facility] [address], [place], [place]  [address] Phone: [phone]; Fax: [phone]

## 2019-01-01 NOTE — Patient Instructions (Signed)
Medication Instructions:  Your physician has recommended you make the following change in your medication:  1. INCREASE SPIRONOLACTONE TO 25 MG DAILY.  If you need a refill on your cardiac medications before your next appointment, please call your pharmacy.   Lab work: TO BE DONE IN 1 WEEK AT NURSE VISIT: BMET  If you have labs (blood work) drawn today and your tests are completely normal, you will receive your results only by: Marland Kitchen MyChart Message (if you have MyChart) OR . A paper copy in the mail If you have any lab test that is abnormal or we need to change your treatment, we will call you to review the results.  Testing/Procedures: Your physician has requested that you have an echocardiogram. Echocardiography is a painless test that uses sound waves to create images of your heart. It provides your doctor with information about the size and shape of your heart and how well your heart's chambers and valves are working. This procedure takes approximately one hour. There are no restrictions for this procedure.    Follow-Up: At [facility], you and your health needs are our priority.  As part of our continuing mission to provide you with exceptional heart care, we have created designated Provider Care Teams.  These Care Teams include your primary Cardiologist (physician) and Advanced Practice Providers (APPs -  Physician Assistants and Nurse Practitioners) who all work together to provide you with the care you need, when you need it. You will need a follow up appointment in:  12 months.  Please call our office 2 months in advance to schedule this appointment.  You may see Lauree Chandler, MD or one of the following Advanced Practice Providers on your designated Care Team: Richardson Dopp, PA-C [place], [place] . Daune Perch, NP  Your physician recommends that you schedule a follow-up appointment in [place].   Any Other Special Instructions Will Be  Listed Below (If Applicable).  *PLEASE BRING BLOOD PRESSURE CUFF TO CHECK BLOOD PRESSURE WITH MACHINE AND NURSE. ALSO CHECK BLOOD PRESSURE 1-2 TIMES PER DAY AND BRING  READINGS IN 1 WEEK AT VISIT.*  Echocardiogram An echocardiogram is a procedure that uses painless sound waves (ultrasound) to produce an image of the heart. Images from an echocardiogram can provide important information about:  Signs of coronary artery disease (CAD).  Aneurysm detection. An aneurysm is a weak or damaged part of an artery wall that bulges out from the normal force of blood pumping through the body.  Heart size and shape. Changes in the size or shape of the heart can be associated with certain conditions, including heart failure, aneurysm, and CAD.  Heart muscle function.  Heart valve function.  Signs of a past heart attack.  Fluid buildup around the heart.  Thickening of the heart muscle.  A tumor or infectious growth around the heart valves. Tell a health care provider about:  Any allergies you have.  All medicines you are taking, including vitamins, herbs, eye drops, creams, and over-the-counter medicines.  Any blood disorders you have.  Any surgeries you have had.  Any medical conditions you have.  Whether you are pregnant or may be pregnant. What are the risks? Generally, this is a safe procedure. However, problems may occur, including:  Allergic reaction to dye (contrast) that may be used during the procedure. What happens before the procedure? No specific preparation is needed. You may eat and drink normally. What happens during the procedure?  An IV tube may be inserted into one of your veins.  You may receive contrast through this tube. A contrast is an injection that improves the quality of the pictures from your heart.  A gel will be applied to your chest.  A wand-like tool (transducer) will be moved over your chest. The gel will help to transmit the sound waves from the  transducer.  The sound waves will harmlessly bounce off of your heart to allow the heart images to be captured in real-time motion. The images will be recorded on a computer. The procedure may vary among health care providers and hospitals. What happens after the procedure?  You may return to your normal, everyday life, including diet, activities, and medicines, unless your health care provider tells you not to do that. Summary  An echocardiogram is a procedure that uses painless sound waves (ultrasound) to produce an image of the heart.  Images from an echocardiogram can provide important information about the size and shape of your heart, heart muscle function, heart valve function, and fluid buildup around your heart.  You do not need to do anything to prepare before this procedure. You may eat and drink normally.  After the echocardiogram is completed, you may return to your normal, everyday life, unless your health care provider tells you not to do that. This information is not intended to replace advice given to you by your health care provider. Make sure you discuss any questions you have with your health care provider. Document Released: 12/06/2000 Document Revised: 01/11/2017 Document Reviewed: 01/11/2017 Elsevier Interactive Patient Education  2019 [employer].

## 2019-01-08 ENCOUNTER — Other Ambulatory Visit: Payer: BLUE CROSS/BLUE SHIELD | Admitting: *Deleted

## 2019-01-08 ENCOUNTER — Ambulatory Visit (INDEPENDENT_AMBULATORY_CARE_PROVIDER_SITE_OTHER): Payer: BLUE CROSS/BLUE SHIELD | Admitting: *Deleted

## 2019-01-08 VITALS — BP 152/90 | HR 82 | Wt 225.0 lb

## 2019-01-08 DIAGNOSIS — R011 Cardiac murmur, unspecified: Secondary | ICD-10-CM | POA: Diagnosis not present

## 2019-01-08 DIAGNOSIS — I10 Essential (primary) hypertension: Secondary | ICD-10-CM | POA: Diagnosis not present

## 2019-01-08 DIAGNOSIS — I2510 Atherosclerotic heart disease of native coronary artery without angina pectoris: Secondary | ICD-10-CM | POA: Diagnosis not present

## 2019-01-08 DIAGNOSIS — I5022 Chronic systolic (congestive) heart failure: Secondary | ICD-10-CM | POA: Diagnosis not present

## 2019-01-08 NOTE — Progress Notes (Signed)
Pt here for BP check. Manual check was 152/90, pulse 82. Pt brought his wrist BP cuff and it showed reading of 142/94.  He reports BP always goes up when he is in doctor's office.  Machine shows home readings of 122/80,124/80,129/83,142/88,93/60,99/67 145/91,146/97,129/82,124/76,117/75, 138/93 Usually checks in AM and PM.  He is feeling well. I reviewed with Richardson Dopp, PA and no changes needed. Pt aware to continue same medications.

## 2019-01-09 LAB — BASIC METABOLIC PANEL
BUN / CREAT RATIO: 15 (ref 10–24)
BUN: 15 mg/dL (ref 8–27)
CALCIUM: 9.4 mg/dL (ref 8.6–10.2)
CO2: 21 mmol/L (ref 20–29)
Chloride: 104 mmol/L (ref 96–106)
Creatinine, Ser: 1.01 mg/dL (ref 0.76–1.27)
GFR, EST AFRICAN AMERICAN: 90 mL/min/{1.73_m2} (ref >59)
GFR, EST NON AFRICAN AMERICAN: 78 mL/min/{1.73_m2} (ref >59)
Glucose: 189 mg/dL — ABNORMAL HIGH (ref 65–99)
POTASSIUM: 4.4 mmol/L (ref 3.5–5.2)
SODIUM: 141 mmol/L (ref 134–144)

## 2019-01-11 NOTE — Progress Notes (Signed)
Reviewed with P. Earley Favor, RN. Most BPs at home are optimal.  Home cuff and Hg cuff are fairly close.  His pressure goes up in clinic.  Review of his home BP readings indicates his BP goes up at home prior to coming into our office. Therefore, BP seems to be well controlled.  Continue current medications and follow up as planned.  Richardson Dopp, PA-C    01/11/2019 7:19 AM

## 2019-01-12 ENCOUNTER — Other Ambulatory Visit: Payer: Self-pay | Admitting: Cardiovascular Disease

## 2019-01-12 NOTE — Telephone Encounter (Signed)
Outpatient Medication Detail    Disp Refills Start    spironolactone (ALDACTONE) 25 MG tablet 90 tablet 3 01/01/2019    Sig - Route: Take 1 tablet (25 mg total) by mouth daily. - Oral   Sent to pharmacy as: spironolactone (ALDACTONE) 25 MG tablet   E-Prescribing Status: Receipt confirmed by pharmacy (01/01/2019 10:46 AM EST)   Pharmacy   [place], [place]

## 2019-01-15 ENCOUNTER — Other Ambulatory Visit: Payer: Self-pay

## 2019-01-15 ENCOUNTER — Encounter: Payer: Self-pay | Admitting: Physician Assistant

## 2019-01-15 ENCOUNTER — Ambulatory Visit (HOSPITAL_COMMUNITY): Payer: BLUE CROSS/BLUE SHIELD | Attending: Cardiovascular Disease

## 2019-01-15 DIAGNOSIS — I5022 Chronic systolic (congestive) heart failure: Secondary | ICD-10-CM | POA: Insufficient documentation

## 2019-01-15 DIAGNOSIS — R011 Cardiac murmur, unspecified: Secondary | ICD-10-CM | POA: Diagnosis not present

## 2019-01-15 MED ORDER — PERFLUTREN LIPID MICROSPHERE
1.0000 mL | INTRAVENOUS | Status: AC | PRN
  Administered 2019-01-15: 2 mL via INTRAVENOUS

## 2019-01-15 MED FILL — SPIRONOLACTONE 25 MG TABS: 25 | 90 days supply | Qty: 90 | Fill #0

## 2019-02-05 MED FILL — CARVEDILOL 25 MG TABLET: 25 | 90 days supply | Qty: 180 | Fill #0

## 2019-02-16 MED FILL — ROSUVASTATIN CALCIUM 10 MG: 10 | 90 days supply | Qty: 90 | Fill #0

## 2019-02-19 MED FILL — metFORMIN HCL 1000 MG TABS: 1000 | 90 days supply | Qty: 225 | Fill #1

## 2019-03-09 DIAGNOSIS — H401131 Primary open-angle glaucoma, bilateral, mild stage: Secondary | ICD-10-CM | POA: Diagnosis not present

## 2019-03-09 MED FILL — LATANOPROST 0.005% EYE DRP: 0.005 | 25 days supply | Qty: 3 | Fill #0

## 2019-03-29 MED FILL — QUINAPRIL 40 MG TABLET: 40 | 90 days supply | Qty: 180 | Fill #0

## 2019-04-20 MED FILL — SPIRONOLACTONE 25 MG TABS: 25 | 90 days supply | Qty: 90 | Fill #1

## 2019-05-14 MED FILL — CARVEDILOL 25 MG TABLET: 25 | 90 days supply | Qty: 180 | Fill #1

## 2019-05-26 MED FILL — ROSUVASTATIN CALCIUM 10 MG: 10 | 90 days supply | Qty: 90 | Fill #1

## 2019-06-04 DIAGNOSIS — E785 Hyperlipidemia, unspecified: Secondary | ICD-10-CM | POA: Diagnosis not present

## 2019-06-04 DIAGNOSIS — I10 Essential (primary) hypertension: Secondary | ICD-10-CM | POA: Diagnosis not present

## 2019-06-04 DIAGNOSIS — E1122 Type 2 diabetes mellitus with diabetic chronic kidney disease: Secondary | ICD-10-CM | POA: Diagnosis not present

## 2019-06-05 MED FILL — metFORMIN HCL 1000 MG TABS: 1000 | 90 days supply | Qty: 225 | Fill #0

## 2019-07-05 ENCOUNTER — Other Ambulatory Visit: Payer: Self-pay | Admitting: Physician Assistant

## 2019-07-05 MED FILL — QUINAPRIL 40 MG TABLET: 40 | 90 days supply | Qty: 180 | Fill #0

## 2019-07-25 MED FILL — LATANOPROST 0.005% EYE DRP: 0.005 | 25 days supply | Qty: 3 | Fill #1

## 2019-07-26 MED FILL — SPIRONOLACTONE 25 MG TABS: 25 | 90 days supply | Qty: 90 | Fill #2

## 2019-08-17 MED FILL — CARVEDILOL 25 MG TABLET: 25 | 90 days supply | Qty: 180 | Fill #2

## 2019-08-26 MED FILL — ROSUVASTATIN CALCIUM 10 MG: 10 | 90 days supply | Qty: 90 | Fill #2

## 2019-09-03 DIAGNOSIS — I10 Essential (primary) hypertension: Secondary | ICD-10-CM | POA: Diagnosis not present

## 2019-09-03 DIAGNOSIS — E119 Type 2 diabetes mellitus without complications: Secondary | ICD-10-CM | POA: Diagnosis not present

## 2019-09-06 MED FILL — metFORMIN HCL 1000 MG TABS: 1000 | 90 days supply | Qty: 225 | Fill #0

## 2019-09-16 DIAGNOSIS — H401131 Primary open-angle glaucoma, bilateral, mild stage: Secondary | ICD-10-CM | POA: Diagnosis not present

## 2019-10-01 MED FILL — LATANOPROST 0.005% OPTH SOL: 0.005 | 25 days supply | Qty: 3 | Fill #2

## 2019-10-05 MED FILL — QUINAPRIL 40 MG TABLET: 40 | 90 days supply | Qty: 180 | Fill #1

## 2019-11-03 MED FILL — SPIRONOLACTONE 25 MG TABS: 25 | 90 days supply | Qty: 90 | Fill #3

## 2019-11-20 MED FILL — CARVEDILOL 25 MG TABLET: 25 | 90 days supply | Qty: 180 | Fill #3

## 2019-12-03 MED FILL — ROSUVASTATIN CALCIUM 10 MG: 10 | 90 days supply | Qty: 90 | Fill #3

## 2019-12-09 MED FILL — LATANOPROST 0.005% OPTH SOL: 0.005 | 25 days supply | Qty: 3 | Fill #3

## 2019-12-09 MED FILL — metFORMIN HCL 1000 MG TABS: 1000 | 90 days supply | Qty: 225 | Fill #1

## 2020-01-14 ENCOUNTER — Ambulatory Visit: Payer: BLUE CROSS/BLUE SHIELD | Admitting: Physician Assistant

## 2020-01-18 ENCOUNTER — Other Ambulatory Visit: Payer: Self-pay | Admitting: Physician Assistant

## 2020-01-18 MED FILL — QUINAPRIL 40 MG TABLET: 40 | 90 days supply | Qty: 180 | Fill #0

## 2020-01-28 ENCOUNTER — Ambulatory Visit: Payer: BC Managed Care – PPO | Admitting: Cardiology

## 2020-01-28 ENCOUNTER — Other Ambulatory Visit: Payer: Self-pay

## 2020-01-28 VITALS — BP 148/90 | HR 62 | Ht 70.0 in | Wt 218.0 lb

## 2020-01-28 DIAGNOSIS — I10 Essential (primary) hypertension: Secondary | ICD-10-CM | POA: Diagnosis not present

## 2020-01-28 DIAGNOSIS — I5022 Chronic systolic (congestive) heart failure: Secondary | ICD-10-CM | POA: Diagnosis not present

## 2020-01-28 DIAGNOSIS — I2510 Atherosclerotic heart disease of native coronary artery without angina pectoris: Secondary | ICD-10-CM

## 2020-01-28 MED ORDER — HYDRALAZINE HCL 25 MG PO TABS: 25.0000 mg | ORAL_TABLET | Freq: Three times a day (TID) | ORAL | 3 refills | Status: DC

## 2020-01-28 MED FILL — hydrALAZINE HCL 25 MG TABS: 25 | 90 days supply | Qty: 270 | Fill #0

## 2020-01-28 NOTE — Progress Notes (Signed)
Cardiology Office Note:    Date:  01/28/2020   ID:  Zachary Ellis, DOB 23-May-1951, MRN [id number]  PCP:  [name], MD  Cardiologist:  Lauree Chandler, MD  Referring MD: [name], *   Chief Complaint  Patient presents with  . Follow-up  . Hypertension    History of Present Illness:    Zachary Ellis is a 69 y.o. male with a past medical history significant for coronary artery disease status post myocardial infarction in 2005 treated with a drug-eluting stent to the LCx and unsuccessful attempted PCI of a chronically occluded LAD and subsequent CABG in December [id number], chronic systolic heart failure secondary to ischemic cardiomyopathy, hypertension, hyperlipidemia, diabetes.  EF by echocardiogram in 2017 was 25-30%.  The patient was evaluated by Dr. Lovena Le for ICD implantation.  He did not meet indications for ICD implant due to NYHA 1 symptoms.  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  He was last seen by Dr. Angelena Form in December 2018.   Zachary Ellis was recently seen in the office on 01/01/2019 by Richardson Dopp, PA for routine follow-up.  He was not having any anginal symptoms and he was NYHA I heart failure symptoms.  It was noted that he had run out of his blood pressure medications for about 3 weeks but had resumed taking them. His spironolactone was increased.   Today Zachary Ellis is feeling well. He denies chest pain/pressure, shortness of breath, orthopnea, PND, edema, palpitations, lightheadedness or syncope.   He is trying to lose weight and working diet and reducing portion size. He limits salt intake and is reading labels.   Home BPs 120's/mid 80's  Cardiac studies    Echocardiogram 10/04/2016 EF 25-30, inferolateral/inferior/inferoseptal akinesis, grade 1 diastolic dysfunction, mildly dilated ascending aorta (41 mm), moderate LAE, mild RVE, trivial TR  Past Medical History:  Diagnosis Date  . Chronic systolic heart failure ([place]) 09/08/2015   2/2  Ischemic CM // Echo 09/2016 - EF 25-30, inferolateral/inferior/inferoseptal akinesis, grade 1 diastolic dysfunction, mildly dilated ascending aorta (41 mm), moderate LAE, mild RVE, trivial TR // Eval by EP (Dr. Lovena Le) in 2016 - no ICD (Pt NYHA 1 and works around high voltage)// Echo 12/2018: EF 25-30, diff HK, Gr 1 DD, mild LAE, mild TR   . Coronary artery disease    s/p MI in 2005 tx with DES to LCx, unsuccessful PCI to LAD CTO // s/p CABG in 11/2014 (L-LAD, S-RCA, S-OM, S-Dx)   . Fatty liver   . Hyperlipidemia, mixed   . Hypertension   . Hypothyroidism    no longer on meds-not in 15 yrs.  . Kidney stones    "passed on my own" (Dec 21, 2014)  . 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 Type II diabetes mellitus ([place])     Past Surgical History:  Procedure Laterality Date  . CARDIAC CATHETERIZATION  2014/12/21   "recommending about [facility]"  . COLONOSCOPY WITH PROPOFOL N/A 03/29/2016   Procedure: COLONOSCOPY WITH PROPOFOL;  Surgeon: Doran Stabler, MD;  Location: [facility];  Service: Gastroenterology;  Laterality: N/A;  . CORONARY ANGIOPLASTY  01/2004   failed LAD PCI  . CORONARY ANGIOPLASTY WITH STENT PLACEMENT  12/2003   CFX  . CORONARY ARTERY BYPASS GRAFT N/A 12/09/2014   Procedure: CORONARY ARTERY BYPASS GRAFTING (CABG), ON PUMP, TIMES FOUR, USING LEFT INTERNAL MAMMARY ARTERY, RIGHT GREATER SAPHENOUS  VEIN HARVESTED ENDOSCOPICALLY;  Surgeon: Rexene Alberts, MD;  Location: [facility];  Service: Open Heart Surgery;  Laterality: N/A;  . INTRAOPERATIVE TRANSESOPHAGEAL ECHOCARDIOGRAM N/A 12/09/2014   Procedure: INTRAOPERATIVE TRANSESOPHAGEAL ECHOCARDIOGRAM;  Surgeon: Rexene Alberts, MD;  Location: [facility];  Service: Open Heart Surgery;  Laterality: N/A;  . LEFT HEART CATHETERIZATION WITH CORONARY ANGIOGRAM N/A 12/07/2014   Procedure: LEFT HEART CATHETERIZATION WITH CORONARY ANGIOGRAM;  Surgeon: Burnell Blanks, MD;  Location:  [facility];  Service: Cardiovascular;  Laterality: N/A;  . WISDOM TOOTH EXTRACTION  1980's   'all 4"    Current Medications: Current Meds  Medication Sig  . aspirin EC 81 MG tablet Take 1 tablet (81 mg total) by mouth daily.  . carvedilol (COREG) 25 MG tablet Take 1 tablet (25 mg total) by mouth 2 (two) times daily with a meal.  . fluticasone (FLONASE) 50 MCG/ACT nasal spray Place 1 spray into both nostrils daily as needed for allergies or rhinitis.  Marland Kitchen JANUVIA 100 MG tablet Take 100 mg by mouth daily.  Marland Kitchen loratadine (CLARITIN) 10 MG tablet Take 10 mg by mouth every morning.   . metFORMIN (GLUCOPHAGE) 1000 MG tablet Take 1,000-1,500 mg by mouth See admin instructions. *TAKES 1000MG  IN THE MORNING AND 1500MG  IN THE EVENING*  . nitroGLYCERIN (NITROSTAT) 0.4 MG SL tablet Place 1 tablet (0.4 mg total) under the tongue every 5 (five) minutes x 3 doses as needed for chest pain.  Marland Kitchen quinapril (ACCUPRIL) 40 MG tablet TAKE 2 TABLETS (80 MG TOTAL) BY MOUTH DAILY.  . rosuvastatin (CRESTOR) 10 MG tablet TAKE 1 TABLET BY MOUTH ONCE DAILY.     Allergies:   Patient has no known allergies.   Social History   Socioeconomic History  . Marital status: Single    Spouse name: Not on file  . Number of children: 2  . Years of education: Not on file  . Highest education level: Not on file  Occupational History  . Occupation: Statistician: OTHER    Employer: [employer]  Tobacco Use  . Smoking status: Former Smoker    Packs/day: 0.50    Years: 30.00    Pack years: 15.00    Types: Cigarettes  . Smokeless tobacco: Never Used  . Tobacco comment: "quit smoking cigarettes in ~ 2007"  Substance and Sexual Activity  . Alcohol use: No  . Drug use: No  . Sexual activity: Yes  Other Topics Concern  . Not on file  Social History Narrative  . Not on file   Social Determinants of Health   Financial Resource Strain:   . Difficulty of Paying Living Expenses: Not on file  Food  Insecurity:   . Worried About Charity fundraiser in the Last Year: Not on file  . Ran Out of Food in the Last Year: Not on file  Transportation Needs:   . Lack of Transportation (Medical): Not on file  . Lack of Transportation (Non-Medical): Not on file  Physical Activity:   . Days of Exercise per Week: Not on file  . Minutes of Exercise per Session: Not on file  Stress:   . Feeling of Stress : Not on file  Social Connections:   . Frequency of Communication with Friends and Family: Not on file  . Frequency of Social Gatherings with Friends and Family: Not on file  . Attends Religious Services: Not on file  . Active Member of Clubs or Organizations: Not on file  .  Attends Archivist Meetings: Not on file  . Marital Status: Not on file     Family History: The patient's family history includes Coronary artery disease (age of onset: 66) in his father; Heart attack in his paternal grandfather; Hypertension in his mother. ROS:   Please see the history of present illness.     All other systems reviewed and are negative.   EKG:  EKG is ordered today.  The ekg ordered today demonstrates NSR with TWI in V4-V6 and inferior leads, likely related to LVH. TWI have been intermittently present in the past. Poss incomplete LBBB.  Recent Labs: No results found for requested labs within last 8760 hours.   Recent Lipid Panel    Component Value Date/Time   CHOL 120 02/27/2018 0824   TRIG 128 02/27/2018 0824   HDL 37 (L) 02/27/2018 0824   CHOLHDL 3.2 02/27/2018 0824   CHOLHDL 4 02/10/2015 0834   VLDL 24.6 02/10/2015 0834   LDLCALC 57 02/27/2018 0824    Physical Exam:    VS:  BP (!) 148/90   Pulse 62   Ht 5\' 10"  (1.778 m)   Wt 218 lb (98.9 kg)   SpO2 98%   BMI 31.28 kg/m     Wt Readings from Last 6 Encounters:  01/28/20 218 lb (98.9 kg)  01/08/19 225 lb (102.1 kg)  01/01/19 228 lb (103.4 kg)  12/05/17 218 lb (98.9 kg)  12/21/16 210 lb (95.3 kg)  10/18/16 217 lb 9.6 oz  (98.7 kg)     Physical Exam  Constitutional: He is oriented to person, place, and time. He appears well-developed and well-nourished. No distress.  HENT:  Head: Normocephalic and atraumatic.  Neck: No JVD present.  Cardiovascular: Normal rate, regular rhythm, normal heart sounds and intact distal pulses. Exam reveals no gallop and no friction rub.  No murmur heard. Pulmonary/Chest: Effort normal and breath sounds normal. No respiratory distress. He has no wheezes. He has no rales.  Abdominal: Soft. Bowel sounds are normal.  Musculoskeletal:        General: No edema. Normal range of motion.     Cervical back: Normal range of motion and neck supple.  Neurological: He is alert and oriented to person, place, and time.  Skin: Skin is warm and dry.  Psychiatric: He has a normal mood and affect. His behavior is normal. Judgment and thought content normal.  Vitals reviewed.   ASSESSMENT:    1. Essential hypertension   2. Coronary artery disease involving native coronary artery of native heart without angina pectoris   3. Chronic systolic heart failure (HCC)    PLAN:    In order of problems listed above:  Essential hypertension -BP was elevated at office visit last month, seems to be OK at home but elevated in the office. Spironolactone was increased.  -Pt reports that BP is running good at home, although DBP still in the mid to upper 80's.  Still suboptimal in office. 148/90 upon arrival and 138/88 after rest.  -Will add hydralazine 25 mg TID.  -He is limiting salt intake and reading levels. -Pt will continue to monitor BP at home. We discussed that we have room to increase hydralazine incrementally as needed. He will contact us if BP is consistently running over 130/80. Follow up in 3 months unless needed sooner.   CAD  -Hx of MI in 2005 tx with DES to LCx and subsequent CABG in 2015.   -He is doing well without angina.   -  Continue ASA, statin, beta-blocker.  Chronic systolic  heart failure/ICM -continues to be well compensated.  -Continue ACE-I, BB, Spiro.  Other problems addressed at [place] last month- HLD, Murmur  Medication Adjustments/Labs and Tests Ordered: Current medicines are reviewed at length with the patient today.  Concerns regarding medicines are outlined above. Labs and tests ordered and medication changes are outlined in the patient instructions below:  Patient Instructions  Medication Instructions:  Start Hydralazine 25 mg 3 times a day    *If you need a refill on your cardiac medications before your next appointment, please call your pharmacy*  Lab Work: None ordered   If you have labs (blood work) drawn today and your tests are completely normal, you will receive your results only by: Marland Kitchen MyChart Message (if you have MyChart) OR . A paper copy in the mail If you have any lab test that is abnormal or we need to change your treatment, we will call you to review the results.  Testing/Procedures: None ordered   Follow-Up: You are scheduled to see Lauree Chandler, MD on 05/15/2020 @ 4:20 PM  Other Instructions  Lifestyle Modifications to Prevent and Treat Heart Disease -Recommend heart healthy/Mediterranean diet, with whole grains, fruits, vegetables, fish, lean meats, nuts, olive oil and avocado oil.  -Limit salt intake to less than 2000 mg per day.  -Recommend moderate walking, starting slowly with a few minutes and working up to 3-5 times/week for 30-50 minutes each session. Aim for at least 150 minutes.week. Goal should be pace of 3 miles/hours, or walking 1.5 miles in 30 minutes -Recommend avoidance of tobacco products. Avoid excess alcohol. -Keep blood pressure well controlled, ideally less than 130/80.  ====================================   DASH Eating Plan DASH stands for "Dietary Approaches to Stop Hypertension." The DASH eating plan is a healthy eating plan that has been shown to reduce high blood pressure (hypertension). It  may also reduce your risk for type 2 diabetes, heart disease, and stroke. The DASH eating plan may also help with weight loss. What are tips for following this plan?  General guidelines  Avoid eating more than 2,300 mg (milligrams) of salt (sodium) a day. If you have hypertension, you may need to reduce your sodium intake to 1,500 mg a day.  Limit alcohol intake to no more than 1 drink a day for nonpregnant women and 2 drinks a day for men. One drink equals 12 oz of beer, 5 oz of wine, or 1 oz of hard liquor.  Work with your health care provider to maintain a healthy body weight or to lose weight. Ask what an ideal weight is for you.  Get at least 30 minutes of exercise that causes your heart to beat faster (aerobic exercise) most days of the week. Activities may include walking, swimming, or biking.  Work with your health care provider or diet and nutrition specialist (dietitian) to adjust your eating plan to your individual calorie needs. Reading food labels   Check food labels for the amount of sodium per serving. Choose foods with less than 5 percent of the Daily Value of sodium. Generally, foods with less than 300 mg of sodium per serving fit into this eating plan.  To find whole grains, look for the word "whole" as the first word in the ingredient list. Shopping  Buy products labeled as "low-sodium" or "no salt added."  Buy fresh foods. Avoid canned foods and premade or frozen meals. Cooking  Avoid adding salt when cooking. Use salt-free seasonings or herbs  instead of table salt or sea salt. Check with your health care provider or pharmacist before using salt substitutes.  Do not fry foods. Cook foods using healthy methods such as baking, boiling, grilling, and broiling instead.  Cook with heart-healthy oils, such as olive, canola, soybean, or sunflower oil. Meal planning  Eat a balanced diet that includes: ? 5 or more servings of fruits and vegetables each day. At each  meal, try to fill half of your plate with fruits and vegetables. ? Up to 6-8 servings of whole grains each day. ? Less than 6 oz of lean meat, poultry, or fish each day. A 3-oz serving of meat is about the same size as a deck of cards. One egg equals 1 oz. ? 2 servings of low-fat dairy each day. ? A serving of nuts, seeds, or beans 5 times each week. ? Heart-healthy fats. Healthy fats called Omega-3 fatty acids are found in foods such as flaxseeds and coldwater fish, like sardines, salmon, and mackerel.  Limit how much you eat of the following: ? Canned or prepackaged foods. ? Food that is high in trans fat, such as fried foods. ? Food that is high in saturated fat, such as fatty meat. ? Sweets, desserts, sugary drinks, and other foods with added sugar. ? Full-fat dairy products.  Do not salt foods before eating.  Try to eat at least 2 vegetarian meals each week.  Eat more home-cooked food and less restaurant, buffet, and fast food.  When eating at a restaurant, ask that your food be prepared with less salt or no salt, if possible. What foods are recommended? The items listed may not be a complete list. Talk with your dietitian about what dietary choices are best for you. Grains Whole-grain or whole-wheat bread. Whole-grain or whole-wheat pasta. Brown rice. Modena Morrow. Bulgur. Whole-grain and low-sodium cereals. Pita bread. Low-fat, low-sodium crackers. Whole-wheat flour tortillas. Vegetables Fresh or frozen vegetables (raw, steamed, roasted, or grilled). Low-sodium or reduced-sodium tomato and vegetable juice. Low-sodium or reduced-sodium tomato sauce and tomato paste. Low-sodium or reduced-sodium canned vegetables. Fruits All fresh, dried, or frozen fruit. Canned fruit in natural juice (without added sugar). Meat and other protein foods Skinless chicken or [place]. Ground chicken or [place]. Pork with fat trimmed off. Fish and seafood. Egg whites. Dried beans, peas, or lentils.  Unsalted nuts, nut butters, and seeds. Unsalted canned beans. Lean cuts of beef with fat trimmed off. Low-sodium, lean deli meat. Dairy Low-fat (1%) or fat-free (skim) milk. Fat-free, low-fat, or reduced-fat cheeses. Nonfat, low-sodium ricotta or cottage cheese. Low-fat or nonfat yogurt. Low-fat, low-sodium cheese. Fats and oils Soft margarine without trans fats. Vegetable oil. Low-fat, reduced-fat, or light mayonnaise and salad dressings (reduced-sodium). Canola, safflower, olive, soybean, and sunflower oils. Avocado. Seasoning and other foods Herbs. Spices. Seasoning mixes without salt. Unsalted popcorn and pretzels. Fat-free sweets. What foods are not recommended? The items listed may not be a complete list. Talk with your dietitian about what dietary choices are best for you. Grains Baked goods made with fat, such as croissants, muffins, or some breads. Dry pasta or rice meal packs. Vegetables Creamed or fried vegetables. Vegetables in a cheese sauce. Regular canned vegetables (not low-sodium or reduced-sodium). Regular canned tomato sauce and paste (not low-sodium or reduced-sodium). Regular tomato and vegetable juice (not low-sodium or reduced-sodium). Angie Fava. Olives. Fruits Canned fruit in a light or heavy syrup. Fried fruit. Fruit in cream or butter sauce. Meat and other protein foods Fatty cuts of meat. Ribs. Maceo Pro  meat. Berniece Salines. Sausage. Bologna and other processed lunch meats. Salami. Fatback. Hotdogs. Bratwurst. Salted nuts and seeds. Canned beans with added salt. Canned or smoked fish. Whole eggs or egg yolks. Chicken or [place] with skin. Dairy Whole or 2% milk, cream, and half-and-half. Whole or full-fat cream cheese. Whole-fat or sweetened yogurt. Full-fat cheese. Nondairy creamers. Whipped toppings. Processed cheese and cheese spreads. Fats and oils Butter. Stick margarine. Lard. Shortening. Ghee. Bacon fat. Tropical oils, such as coconut, palm kernel, or palm oil. Seasoning and  other foods Salted popcorn and pretzels. Onion salt, garlic salt, seasoned salt, table salt, and sea salt. Worcestershire sauce. Tartar sauce. Barbecue sauce. Teriyaki sauce. Soy sauce, including reduced-sodium. Steak sauce. Canned and packaged gravies. Fish sauce. Oyster sauce. Cocktail sauce. Horseradish that you find on the shelf. Ketchup. Mustard. Meat flavorings and tenderizers. Bouillon cubes. Hot sauce and Tabasco sauce. Premade or packaged marinades. Premade or packaged taco seasonings. Relishes. Regular salad dressings. Where to find more information:  National Heart, Lung, and [place]: https://wilson-eaton.com/  American Heart Association: www.heart.org Summary  The DASH eating plan is a healthy eating plan that has been shown to reduce high blood pressure (hypertension). It may also reduce your risk for type 2 diabetes, heart disease, and stroke.  With the DASH eating plan, you should limit salt (sodium) intake to 2,300 mg a day. If you have hypertension, you may need to reduce your sodium intake to 1,500 mg a day.  When on the DASH eating plan, aim to eat more fresh fruits and vegetables, whole grains, lean proteins, low-fat dairy, and heart-healthy fats.  Work with your health care provider or diet and nutrition specialist (dietitian) to adjust your eating plan to your individual calorie needs. This information is not intended to replace advice given to you by your health care provider. Make sure you discuss any questions you have with your health care provider. Document Revised: 11/21/2017 Document Reviewed: 12/02/2016 Elsevier Patient Education  2020 [place], Daune Perch, NP  01/28/2020 3:43 PM    [facility]

## 2020-01-28 NOTE — Patient Instructions (Addendum)
Medication Instructions:  Start Hydralazine 25 mg 3 times a day    *If you need a refill on your cardiac medications before your next appointment, please call your pharmacy*  Lab Work: None ordered   If you have labs (blood work) drawn today and your tests are completely normal, you will receive your results only by: [facility] MyChart Message (if you have MyChart) OR . A paper copy in the mail If you have any lab test that is abnormal or we need to change your treatment, we will call you to review the results.  Testing/Procedures: None ordered   Follow-Up: You are scheduled to see Lauree Chandler, MD on 05/18/2020 @ 4:20 PM  Other Instructions  Lifestyle Modifications to Prevent and Treat Heart Disease -Recommend heart healthy/Mediterranean diet, with whole grains, fruits, vegetables, fish, lean meats, nuts, olive oil and avocado oil.  -Limit salt intake to less than 2000 mg per day.  -Recommend moderate walking, starting slowly with a few minutes and working up to 3-5 times/week for 30-50 minutes each session. Aim for at least 150 minutes.week. Goal should be pace of 3 miles/hours, or walking 1.5 miles in 30 minutes -Recommend avoidance of tobacco products. Avoid excess alcohol. -Keep blood pressure well controlled, ideally less than 130/80.  ====================================   DASH Eating Plan DASH stands for "Dietary Approaches to Stop Hypertension." The DASH eating plan is a healthy eating plan that has been shown to reduce high blood pressure (hypertension). It may also reduce your risk for type 2 diabetes, heart disease, and stroke. The DASH eating plan may also help with weight loss. What are tips for following this plan?  General guidelines  Avoid eating more than 2,300 mg (milligrams) of salt (sodium) a day. If you have hypertension, you may need to reduce your sodium intake to 1,500 mg a day.  Limit alcohol intake to no more than 1 drink a day for nonpregnant women and  2 drinks a day for men. One drink equals 12 oz of beer, 5 oz of wine, or 1 oz of hard liquor.  Work with your health care provider to maintain a healthy body weight or to lose weight. Ask what an ideal weight is for you.  Get at least 30 minutes of exercise that causes your heart to beat faster (aerobic exercise) most days of the week. Activities may include walking, swimming, or biking.  Work with your health care provider or diet and nutrition specialist (dietitian) to adjust your eating plan to your individual calorie needs. Reading food labels   Check food labels for the amount of sodium per serving. Choose foods with less than 5 percent of the Daily Value of sodium. Generally, foods with less than 300 mg of sodium per serving fit into this eating plan.  To find whole grains, look for the word "whole" as the first word in the ingredient list. Shopping  Buy products labeled as "low-sodium" or "no salt added."  Buy fresh foods. Avoid canned foods and premade or frozen meals. Cooking  Avoid adding salt when cooking. Use salt-free seasonings or herbs instead of table salt or sea salt. Check with your health care provider or pharmacist before using salt substitutes.  Do not fry foods. Cook foods using healthy methods such as baking, boiling, grilling, and broiling instead.  Cook with heart-healthy oils, such as olive, canola, soybean, or sunflower oil. Meal planning  Eat a balanced diet that includes: ? 5 or more servings of fruits and vegetables each day. At  each meal, try to fill half of your plate with fruits and vegetables. ? Up to 6-8 servings of whole grains each day. ? Less than 6 oz of lean meat, poultry, or fish each day. A 3-oz serving of meat is about the same size as a deck of cards. One egg equals 1 oz. ? 2 servings of low-fat dairy each day. ? A serving of nuts, seeds, or beans 5 times each week. ? Heart-healthy fats. Healthy fats called Omega-3 fatty acids are found in  foods such as flaxseeds and coldwater fish, like sardines, salmon, and mackerel.  Limit how much you eat of the following: ? Canned or prepackaged foods. ? Food that is high in trans fat, such as fried foods. ? Food that is high in saturated fat, such as fatty meat. ? Sweets, desserts, sugary drinks, and other foods with added sugar. ? Full-fat dairy products.  Do not salt foods before eating.  Try to eat at least 2 vegetarian meals each week.  Eat more home-cooked food and less restaurant, buffet, and fast food.  When eating at a restaurant, ask that your food be prepared with less salt or no salt, if possible. What foods are recommended? The items listed may not be a complete list. Talk with your dietitian about what dietary choices are best for you. Grains Whole-grain or whole-wheat bread. Whole-grain or whole-wheat pasta. Brown rice. [place]row. Bulgur. Whole-grain and low-sodium cereals. Pita bread. Low-fat, low-sodium crackers. Whole-wheat flour tortillas. Vegetables Fresh or frozen vegetables (raw, steamed, roasted, or grilled). Low-sodium or reduced-sodium tomato and vegetable juice. Low-sodium or reduced-sodium tomato sauce and tomato paste. Low-sodium or reduced-sodium canned vegetables. Fruits All fresh, dried, or frozen fruit. Canned fruit in natural juice (without added sugar). Meat and other protein foods Skinless chicken or [place]. Ground chicken or [place]. Pork with fat trimmed off. Fish and seafood. Egg whites. Dried beans, peas, or lentils. Unsalted nuts, nut butters, and seeds. Unsalted canned beans. Lean cuts of beef with fat trimmed off. Low-sodium, lean deli meat. Dairy Low-fat (1%) or fat-free (skim) milk. Fat-free, low-fat, or reduced-fat cheeses. Nonfat, low-sodium ricotta or cottage cheese. Low-fat or nonfat yogurt. Low-fat, low-sodium cheese. Fats and oils Soft margarine without trans fats. Vegetable oil. Low-fat, reduced-fat, or light mayonnaise and salad  dressings (reduced-sodium). Canola, safflower, olive, soybean, and sunflower oils. Avocado. Seasoning and other foods Herbs. Spices. Seasoning mixes without salt. Unsalted popcorn and pretzels. Fat-free sweets. What foods are not recommended? The items listed may not be a complete list. Talk with your dietitian about what dietary choices are best for you. Grains Baked goods made with fat, such as croissants, muffins, or some breads. Dry pasta or rice meal packs. Vegetables Creamed or fried vegetables. Vegetables in a cheese sauce. Regular canned vegetables (not low-sodium or reduced-sodium). Regular canned tomato sauce and paste (not low-sodium or reduced-sodium). Regular tomato and vegetable juice (not low-sodium or reduced-sodium). Angie Fava. Olives. Fruits Canned fruit in a light or heavy syrup. Fried fruit. Fruit in cream or butter sauce. Meat and other protein foods Fatty cuts of meat. Ribs. Fried meat. Berniece Salines. Sausage. Bologna and other processed lunch meats. Salami. Fatback. Hotdogs. Bratwurst. Salted nuts and seeds. Canned beans with added salt. Canned or smoked fish. Whole eggs or egg yolks. Chicken or [place] with skin. Dairy Whole or 2% milk, cream, and half-and-half. Whole or full-fat cream cheese. Whole-fat or sweetened yogurt. Full-fat cheese. Nondairy creamers. Whipped toppings. Processed cheese and cheese spreads. Fats and oils Butter. Stick margarine. Lard.  Shortening. Ghee. Bacon fat. Tropical oils, such as coconut, palm kernel, or palm oil. Seasoning and other foods Salted popcorn and pretzels. Onion salt, garlic salt, seasoned salt, table salt, and sea salt. Worcestershire sauce. Tartar sauce. Barbecue sauce. Teriyaki sauce. Soy sauce, including reduced-sodium. Steak sauce. Canned and packaged gravies. Fish sauce. Oyster sauce. Cocktail sauce. Horseradish that you find on the shelf. Ketchup. Mustard. Meat flavorings and tenderizers. Bouillon cubes. Hot sauce and Tabasco sauce.  Premade or packaged marinades. Premade or packaged taco seasonings. Relishes. Regular salad dressings. Where to find more information:  National Heart, Lung, and [place]: https://wilson-eaton.com/  American Heart Association: www.heart.org Summary  The DASH eating plan is a healthy eating plan that has been shown to reduce high blood pressure (hypertension). It may also reduce your risk for type 2 diabetes, heart disease, and stroke.  With the DASH eating plan, you should limit salt (sodium) intake to 2,300 mg a day. If you have hypertension, you may need to reduce your sodium intake to 1,500 mg a day.  When on the DASH eating plan, aim to eat more fresh fruits and vegetables, whole grains, lean proteins, low-fat dairy, and heart-healthy fats.  Work with your health care provider or diet and nutrition specialist (dietitian) to adjust your eating plan to your individual calorie needs. This information is not intended to replace advice given to you by your health care provider. Make sure you discuss any questions you have with your health care provider. Document Revised: 11/21/2017 Document Reviewed: 12/02/2016 Elsevier Patient Education  2020 [employer].

## 2020-02-12 ENCOUNTER — Other Ambulatory Visit: Payer: Self-pay | Admitting: Physician Assistant

## 2020-02-14 MED FILL — SPIRONOLACTONE 25 MG TABS: 25 | 90 days supply | Qty: 90 | Fill #0

## 2020-02-29 ENCOUNTER — Other Ambulatory Visit: Payer: Self-pay | Admitting: Physician Assistant

## 2020-02-29 MED FILL — CARVEDILOL 25 MG TABLET: 25 | 90 days supply | Qty: 180 | Fill #0

## 2020-03-08 MED FILL — LATANOPROST 0.005% OPTH SOL: 0.005 | 25 days supply | Qty: 3 | Fill #0

## 2020-03-16 ENCOUNTER — Other Ambulatory Visit: Payer: Self-pay | Admitting: Cardiovascular Disease

## 2020-03-16 ENCOUNTER — Other Ambulatory Visit: Payer: Self-pay

## 2020-03-16 DIAGNOSIS — H401131 Primary open-angle glaucoma, bilateral, mild stage: Secondary | ICD-10-CM | POA: Diagnosis not present

## 2020-03-16 MED ORDER — ROSUVASTATIN CALCIUM 10 MG PO TABS: ORAL_TABLET | ORAL | 3 refills | Status: DC

## 2020-03-16 MED FILL — ROSUVASTATIN CALCIUM 10 MG: 10 | 90 days supply | Qty: 90 | Fill #0

## 2020-03-20 MED FILL — METFORMIN HCL 1000 MG TABS: 1000 | 90 days supply | Qty: 225 | Fill #0

## 2020-04-19 ENCOUNTER — Other Ambulatory Visit: Payer: Self-pay | Admitting: Cardiovascular Disease

## 2020-04-19 ENCOUNTER — Other Ambulatory Visit: Payer: Self-pay | Admitting: Physician Assistant

## 2020-04-19 MED FILL — QUINAPRIL 40 MG TABLET: 40 | 90 days supply | Qty: 180 | Fill #0

## 2020-05-18 ENCOUNTER — Encounter: Payer: Self-pay | Admitting: Cardiovascular Disease

## 2020-05-18 ENCOUNTER — Ambulatory Visit: Payer: BC Managed Care – PPO | Admitting: Cardiovascular Disease

## 2020-05-18 ENCOUNTER — Other Ambulatory Visit: Payer: Self-pay

## 2020-05-18 VITALS — BP 158/88 | HR 81 | Ht 70.0 in | Wt 226.0 lb

## 2020-05-18 DIAGNOSIS — E785 Hyperlipidemia, unspecified: Secondary | ICD-10-CM

## 2020-05-18 DIAGNOSIS — I255 Ischemic cardiomyopathy: Secondary | ICD-10-CM

## 2020-05-18 DIAGNOSIS — I10 Essential (primary) hypertension: Secondary | ICD-10-CM

## 2020-05-18 DIAGNOSIS — I2510 Atherosclerotic heart disease of native coronary artery without angina pectoris: Secondary | ICD-10-CM

## 2020-05-18 NOTE — Progress Notes (Signed)
Chief Complaint  Patient presents with  . Follow-up    CAD     History of Present Illness: 69 yo male with history of CAD s/p CABG, HTN, HLD, DM here today for cardiac follow up. 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 There was an unsuccessful attempt at PCI of a chronically occluded LAD. He was admitted to [facility] December 2015 with unstable angina and CHF. Cardiac cath demonstrated severe three vessel CAD. He underwent 4V CABG (LIMA to LAD, SVG to RCA, SVG to OM and SVG to Diagonal). LVEF was 35% by cath in 2015. Echo in March 2016 with LVEF 20-25%. He has been seen in the EP clinic by Dr. Lovena Le but no ICD placed as he has had NYHA class 1 symptoms. 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   He is here today for follow up. The patient denies any chest pain, dyspnea, palpitations, lower extremity edema, orthopnea, PND, dizziness, near syncope or syncope.    Primary Care Physician: [name], MD  Past Medical History:  Diagnosis Date  . Chronic systolic heart failure ([place]) 09/08/2015   2/2 Ischemic CM // Echo 09/2016 - EF 25-30, inferolateral/inferior/inferoseptal akinesis, grade 1 diastolic dysfunction, mildly dilated ascending aorta (41 mm), moderate LAE, mild RVE, trivial TR // Eval by EP (Dr. Lovena Le) in 2016 - no ICD (Pt NYHA 1 and works around high voltage)// Echo 12/2018: EF 25-30, diff HK, Gr 1 DD, mild LAE, mild TR   . Coronary artery disease    s/p MI in 2005 tx with DES to LCx, unsuccessful PCI to LAD CTO // s/p CABG in 11/2014 (L-LAD, S-RCA, S-OM, S-Dx)   . Fatty liver   . Hyperlipidemia, mixed   . Hypertension   . Hypothyroidism    no longer on meds-not in 15 yrs.  . Kidney stones    "passed on my own" (December 08, 2014)  . 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 Type II diabetes mellitus ([place])     Past Surgical History:  Procedure  Laterality Date  . CARDIAC CATHETERIZATION  2014-12-08   "recommending about [facility]"  . COLONOSCOPY WITH PROPOFOL N/A 03/29/2016   Procedure: COLONOSCOPY WITH PROPOFOL;  Surgeon: Doran Stabler, MD;  Location: [facility];  Service: Gastroenterology;  Laterality: N/A;  . CORONARY ANGIOPLASTY  01/2004   failed LAD PCI  . CORONARY ANGIOPLASTY WITH STENT PLACEMENT  12/2003   CFX  . CORONARY ARTERY BYPASS GRAFT N/A 12/09/2014   Procedure: CORONARY ARTERY BYPASS GRAFTING (CABG), ON PUMP, TIMES FOUR, USING LEFT INTERNAL MAMMARY ARTERY, RIGHT GREATER SAPHENOUS VEIN HARVESTED ENDOSCOPICALLY;  Surgeon: Rexene Alberts, MD;  Location: [facility];  Service: Open Heart Surgery;  Laterality: N/A;  . INTRAOPERATIVE TRANSESOPHAGEAL ECHOCARDIOGRAM N/A 12/09/2014   Procedure: INTRAOPERATIVE TRANSESOPHAGEAL ECHOCARDIOGRAM;  Surgeon: Rexene Alberts, MD;  Location: [facility];  Service: Open Heart Surgery;  Laterality: N/A;  . LEFT HEART CATHETERIZATION WITH CORONARY ANGIOGRAM N/A 12/08/2014   Procedure: LEFT HEART CATHETERIZATION WITH CORONARY ANGIOGRAM;  Surgeon: Burnell Blanks, MD;  Location: [facility] CATH LAB;  Service: Cardiovascular;  Laterality: N/A;  . WISDOM TOOTH EXTRACTION  1980's   'all 4"    Current Outpatient Medications  Medication Sig Dispense Refill  . aspirin EC 81 MG tablet Take 1 tablet (81 mg total) by mouth daily.  90 tablet 3  . carvedilol (COREG) 25 MG tablet TAKE 1 TABLET BY MOUTH 2 TIMES DAILY WITH A MEAL. 180 tablet 3  . fluticasone (FLONASE) 50 MCG/ACT nasal spray Place 1 spray into both nostrils daily as needed for allergies or rhinitis.    . hydrALAZINE (APRESOLINE) 25 MG tablet Take 1 tablet (25 mg total) by mouth 3 (three) times daily. 270 tablet 3  . JANUVIA 100 MG tablet Take 100 mg by mouth daily.    [facility] loratadine (CLARITIN) 10 MG tablet Take 10 mg by mouth every morning.     . metFORMIN (GLUCOPHAGE) 1000 MG tablet Take 1,000-1,500 mg by mouth See admin instructions. *TAKES 1000MG  IN  THE MORNING AND 1500MG  IN THE EVENING*    . nitroGLYCERIN (NITROSTAT) 0.4 MG SL tablet Place 1 tablet (0.4 mg total) under the tongue every 5 (five) minutes x 3 doses as needed for chest pain. 25 tablet 5  . quinapril (ACCUPRIL) 40 MG tablet TAKE 2 TABLETS (80 MG TOTAL) BY MOUTH DAILY. 180 tablet 3  . rosuvastatin (CRESTOR) 10 MG tablet TAKE 1 TABLET BY MOUTH ONCE DAILY. 90 tablet 3  . spironolactone (ALDACTONE) 25 MG tablet TAKE 1 TABLET BY MOUTH DAILY. 90 tablet 3   No current facility-administered medications for this visit.    No Known Allergies  Social History   Socioeconomic History  . Marital status: Single    Spouse name: Not on file  . Number of children: 2  . Years of education: Not on file  . Highest education level: Not on file  Occupational History  . Occupation: Statistician: OTHER    Employer: [employer]  Tobacco Use  . Smoking status: Former Smoker    Packs/day: 0.50    Years: 30.00    Pack years: 15.00    Types: Cigarettes  . Smokeless tobacco: Never Used  . Tobacco comment: "quit smoking cigarettes in ~ 2007"  Substance and Sexual Activity  . Alcohol use: No  . Drug use: No  . Sexual activity: Yes  Other Topics Concern  . Not on file  Social History Narrative  . Not on file   Social Determinants of Health   Financial Resource Strain:   . Difficulty of Paying Living Expenses:   Food Insecurity:   . Worried About Charity fundraiser in the Last Year:   . Arboriculturist in the Last Year:   Transportation Needs:   . Film/video editor (Medical):   [facility] Lack of Transportation (Non-Medical):   Physical Activity:   . Days of Exercise per Week:   . Minutes of Exercise per Session:   Stress:   . Feeling of Stress :   Social Connections:   . Frequency of Communication with Friends and Family:   . Frequency of Social Gatherings with Friends and Family:   . Attends Religious Services:   . Active Member of Clubs or Organizations:     . Attends Archivist Meetings:   [facility] Marital Status:   Intimate Partner Violence:   . Fear of Current or Ex-Partner:   . Emotionally Abused:   [facility] Physically Abused:   . Sexually Abused:     Family History  Problem Relation Age of Onset  . Coronary artery disease Father 55  . Hypertension Mother   . Heart attack Paternal Grandfather        x2 (MI at age 33 was his cause of death)  Review of Systems:  As stated in the HPI and otherwise negative.   BP (!) 158/88   Pulse 81   Ht 5\' 10"  (1.778 m)   Wt 226 lb (102.5 kg)   SpO2 98%   BMI 32.43 kg/m   Physical Examination: General: Well developed, well nourished, NAD  HEENT: OP clear, mucus membranes moist  SKIN: warm, dry. No rashes. Neuro: No focal deficits  Musculoskeletal: Muscle strength 5/5 all ext  Psychiatric: Mood and affect normal  Neck: No JVD, no carotid bruits, no thyromegaly, no lymphadenopathy.  Lungs:Clear bilaterally, no wheezes, rhonci, crackles Cardiovascular: Regular rate and rhythm. No murmurs, gallops or rubs. Abdomen:Soft. Bowel sounds present. Non-tender.  Extremities: No lower extremity edema. Pulses are 2 + in the bilateral DP/PT.  Echo January 2020:  - Left ventricle: The cavity size was moderately dilated. Wall  thickness was normal. Systolic function was severely reduced. The  estimated ejection fraction was in the range of 25% to 30%.  Diffuse hypokinesis. Doppler parameters are consistent with  abnormal left ventricular relaxation (grade 1 diastolic  dysfunction).  - Left atrium: The atrium was mildly dilated.  - Atrial septum: No defect or patent foramen ovale was identified.   EKG:  EKG is not ordered today. The ekg ordered today demonstrates  Recent Labs: No results found for requested labs within last 8760 hours.   Lipid Panel    Component Value Date/Time   CHOL 120 02/27/2018 0824   TRIG 128 02/27/2018 0824   HDL 37 (L) 02/27/2018 0824   CHOLHDL 3.2  02/27/2018 0824   CHOLHDL 4 02/10/2015 0834   VLDL 24.6 02/10/2015 0834   LDLCALC 57 02/27/2018 0824     Wt Readings from Last 3 Encounters:  05/18/20 226 lb (102.5 kg)  01/28/20 218 lb (98.9 kg)  01/08/19 225 lb (102.1 kg)     Other studies Reviewed: Additional studies/ records that were reviewed today include: . Review of the above records demonstrates:    Assessment and Plan:   1. CAD s/p CABG without angina: No chest pain. He is s/p CABG December 2015. Continue ASA, beta blocker and statin.    2. HYPERTENSION: BP is controlled at home. Continue current therapy  3. Ischemic cardiomyopathy: LVEF=20-25% s/p CABG December 2015. He has been seen by Dr. Lovena Le and there has been a discussion regarding an ICD but he is still working with high voltage so ICD is not recommended. Remains NYHA class 1. Continue beta blocker and Ace-inh, aldactone. I will not start Entresto since he is NYHA class 1.    4. Hyperlipidemia: Lipids followed in primary care. Continue statin.   Current medicines are reviewed at length with the patient today.  The patient does not have concerns regarding medicines.  The following changes have been made:  no change  Labs/ tests ordered today include:   No orders of the defined types were placed in this encounter.   Disposition:   F/U with me in 12 months  Signed, Lauree Chandler, MD 05/18/2020 7:37 PM    [facility] [address], [place], [place]  [address] Phone: [phone]; Fax: [phone]

## 2020-05-18 NOTE — Patient Instructions (Signed)

## 2020-05-21 MED FILL — SPIRONOLACTONE 25 MG TABS: 25 | 90 days supply | Qty: 90 | Fill #1

## 2020-05-26 MED FILL — LATANOPROST 0.005% OPTH SOL: 0.005 | 25 days supply | Qty: 3 | Fill #1

## 2020-06-02 DIAGNOSIS — E119 Type 2 diabetes mellitus without complications: Secondary | ICD-10-CM | POA: Diagnosis not present

## 2020-06-02 DIAGNOSIS — E785 Hyperlipidemia, unspecified: Secondary | ICD-10-CM | POA: Diagnosis not present

## 2020-06-02 DIAGNOSIS — I10 Essential (primary) hypertension: Secondary | ICD-10-CM | POA: Diagnosis not present

## 2020-06-02 DIAGNOSIS — E1165 Type 2 diabetes mellitus with hyperglycemia: Secondary | ICD-10-CM | POA: Diagnosis not present

## 2020-06-02 DIAGNOSIS — N189 Chronic kidney disease, unspecified: Secondary | ICD-10-CM | POA: Diagnosis not present

## 2020-06-02 DIAGNOSIS — Z0000 Encounter for general adult medical examination without abnormal findings: Secondary | ICD-10-CM | POA: Diagnosis not present

## 2020-06-05 MED FILL — FARXIGA 10 MG TABLET: 10 | 90 days supply | Qty: 90 | Fill #0

## 2020-06-16 MED FILL — ROSUVASTATIN CALCIUM 10 MG: 10 | 90 days supply | Qty: 90 | Fill #1

## 2020-06-16 MED FILL — hydrALAZINE HCL 25 MG TABS: 25 | 90 days supply | Qty: 270 | Fill #1

## 2020-06-28 MED FILL — METFORMIN HCL 1000 MG TABS: 1000 | 90 days supply | Qty: 225 | Fill #0

## 2020-07-20 MED FILL — QUINAPRIL HCL 40 MG TABS: 40 | 90 days supply | Qty: 180 | Fill #1

## 2020-07-25 MED FILL — LATANOPROST 0.005% OPTH SOL: 0.005 | 25 days supply | Qty: 3 | Fill #2

## 2020-08-22 MED FILL — SPIRONOLACTONE 25 MG TABS: 25 | 90 days supply | Qty: 90 | Fill #2

## 2020-08-29 MED FILL — FARXIGA 10 MG TABLET: 10 | 90 days supply | Qty: 90 | Fill #0

## 2020-09-05 MED FILL — CARVEDILOL 25 MG TABLET: 25 | 90 days supply | Qty: 180 | Fill #2

## 2020-09-28 MED FILL — LATANOPROST 0.005% OPTH SOL: 0.005 | 25 days supply | Qty: 3 | Fill #3

## 2020-09-28 MED FILL — ROSUVASTATIN CALCIUM 10 MG: 10 | 90 days supply | Qty: 90 | Fill #2

## 2020-09-29 DIAGNOSIS — Z23 Encounter for immunization: Secondary | ICD-10-CM | POA: Diagnosis not present

## 2020-09-29 DIAGNOSIS — R209 Unspecified disturbances of skin sensation: Secondary | ICD-10-CM | POA: Diagnosis not present

## 2020-09-29 DIAGNOSIS — E119 Type 2 diabetes mellitus without complications: Secondary | ICD-10-CM | POA: Diagnosis not present

## 2020-09-29 DIAGNOSIS — I10 Essential (primary) hypertension: Secondary | ICD-10-CM | POA: Diagnosis not present

## 2020-09-29 DIAGNOSIS — E669 Obesity, unspecified: Secondary | ICD-10-CM | POA: Diagnosis not present

## 2020-10-05 ENCOUNTER — Other Ambulatory Visit (HOSPITAL_COMMUNITY): Payer: Self-pay | Admitting: Nurse Practitioner

## 2020-10-05 MED FILL — TRULICITY 1.5 MG/0.5 ML PEN: 1.5 | 84 days supply | Qty: 6 | Fill #0

## 2020-10-05 MED FILL — METFORMIN HCL 1000 MG TABS: 1000 | 90 days supply | Qty: 225 | Fill #0

## 2020-10-18 MED FILL — QUINAPRIL HCL 40 MG TABS: 40 | 90 days supply | Qty: 180 | Fill #2

## 2020-10-23 DIAGNOSIS — H401131 Primary open-angle glaucoma, bilateral, mild stage: Secondary | ICD-10-CM | POA: Diagnosis not present

## 2020-10-31 MED FILL — HYDRALAZINE HCL 25 MG TABS: 25 | 90 days supply | Qty: 270 | Fill #2

## 2020-11-22 MED FILL — SPIRONOLACTONE 25 MG TABS: 25 | 90 days supply | Qty: 90 | Fill #3

## 2020-11-28 ENCOUNTER — Other Ambulatory Visit (HOSPITAL_COMMUNITY): Payer: Self-pay | Admitting: Nurse Practitioner

## 2020-11-28 MED FILL — FARXIGA 10 MG TABLET: 10 | 90 days supply | Qty: 90 | Fill #0

## 2020-12-04 ENCOUNTER — Other Ambulatory Visit (HOSPITAL_COMMUNITY): Payer: Self-pay | Admitting: Ophthalmology

## 2020-12-04 MED FILL — LATANOPROST 0.005% EYE DRP: 0.005 | 25 days supply | Qty: 3 | Fill #0

## 2020-12-05 ENCOUNTER — Other Ambulatory Visit (HOSPITAL_COMMUNITY): Payer: Self-pay | Admitting: Ophthalmology

## 2020-12-05 DIAGNOSIS — H18413 Arcus senilis, bilateral: Secondary | ICD-10-CM | POA: Diagnosis not present

## 2020-12-05 DIAGNOSIS — H2513 Age-related nuclear cataract, bilateral: Secondary | ICD-10-CM | POA: Diagnosis not present

## 2020-12-05 DIAGNOSIS — H2511 Age-related nuclear cataract, right eye: Secondary | ICD-10-CM | POA: Diagnosis not present

## 2020-12-05 DIAGNOSIS — H25013 Cortical age-related cataract, bilateral: Secondary | ICD-10-CM | POA: Diagnosis not present

## 2020-12-05 DIAGNOSIS — H25043 Posterior subcapsular polar age-related cataract, bilateral: Secondary | ICD-10-CM | POA: Diagnosis not present

## 2020-12-05 MED FILL — BESIVANCE 0.6% SUSP: 0.6 | 30 days supply | Qty: 5 | Fill #0

## 2020-12-05 MED FILL — GATIFLOXACIN 0.5% EYE DROPS: 0.5 | 12 days supply | Qty: 3 | Fill #0

## 2020-12-05 MED FILL — PROLENSA 0.07% EYE DROPS: 0.07 | 60 days supply | Qty: 3 | Fill #0

## 2020-12-07 ENCOUNTER — Telehealth: Payer: Self-pay | Admitting: *Deleted

## 2020-12-07 NOTE — Telephone Encounter (Signed)
   Primary Cardiologist: Lauree Chandler, MD  Chart reviewed as part of pre-operative protocol coverage. Cataract extractions are recognized in guidelines as low risk surgeries that do not typically require specific preoperative testing or holding of blood thinner therapy. Therefore, given past medical history and time since last visit, based on ACC/AHA guidelines, TAKUYA LARICCIA would be at acceptable risk for the planned procedure without further cardiovascular testing.   I will route this recommendation to the requesting party via Epic fax function and remove from pre-op pool.  Please call with questions.  Deberah Pelton, NP 12/07/2020, 12:09 PM

## 2020-12-07 NOTE — Telephone Encounter (Signed)
   [facility] Pre-operative Risk Assessment    HEARTCARE STAFF: - Please ensure there is not already an duplicate clearance open for this procedure. - Under Visit Info/Reason for Call, type in Other and utilize the format Clearance MM/DD/YY or Clearance TBD. Do not use dashes or single digits. - If request is for dental extraction, please clarify the # of teeth to be extracted.  Request for surgical clearance:  1. What type of surgery is being performed? CATARACT EXTRACTION w/INTRAOCULAR LENS IMPLANTATION OF THE RIGHT EYE TO BE DO ON 12/18/20 FOLLOWED BY THE LEFT EYE ON 01/08/21  2. When is this surgery scheduled? 12/18/20 AND 01/08/21  3. What type of clearance is required (medical clearance vs. Pharmacy clearance to hold med vs. Both)? MEDICAL  4. Are there any medications that need to be held prior to surgery and how long? NO MEDICATIONS ARE NEEDING TO BE HELD INCLUDING ANY BLOOD THINNERS PER DR. Christia Reading BEVIS   5. Practice name and name of physician performing surgery? [facility]; DR. Christia Reading BEVIS   6. What is the office phone number? [phone]   7.   What is the office fax number? [phone]  8.   Anesthesia type (None, local, MAC, general) ? TOPICAL WITH IV MEDICATION   Julaine Hua 12/07/2020, 11:16 AM  _________________________________________________________________   (provider comments below)

## 2020-12-12 MED FILL — CARVEDILOL 25 MG TABS: 25 | 90 days supply | Qty: 180 | Fill #3

## 2020-12-18 DIAGNOSIS — H2511 Age-related nuclear cataract, right eye: Secondary | ICD-10-CM | POA: Diagnosis not present

## 2020-12-19 DIAGNOSIS — H2512 Age-related nuclear cataract, left eye: Secondary | ICD-10-CM | POA: Diagnosis not present

## 2021-01-04 ENCOUNTER — Other Ambulatory Visit (HOSPITAL_COMMUNITY): Payer: Self-pay | Admitting: Nurse Practitioner

## 2021-01-04 MED FILL — METFORMIN HCL 1000 MG TABS: 1000 | 90 days supply | Qty: 225 | Fill #0

## 2021-01-04 MED FILL — ROSUVASTATIN CALCIUM 10 MG: 10 | 90 days supply | Qty: 90 | Fill #3

## 2021-01-09 ENCOUNTER — Other Ambulatory Visit (HOSPITAL_COMMUNITY): Payer: Self-pay | Admitting: Nurse Practitioner

## 2021-01-09 MED FILL — TRULICITY 1.5 MG/0.5 ML PEN: 1.5 | 84 days supply | Qty: 6 | Fill #0

## 2021-01-16 MED FILL — QUINAPRIL HCL 40 MG TABS: 40 | 90 days supply | Qty: 180 | Fill #3

## 2021-02-05 MED FILL — LATANOPROST 0.005% EYE DRP: 0.005 | 25 days supply | Qty: 3 | Fill #1

## 2021-03-16 ENCOUNTER — Other Ambulatory Visit (HOSPITAL_BASED_OUTPATIENT_CLINIC_OR_DEPARTMENT_OTHER): Payer: Self-pay

## 2021-03-20 ENCOUNTER — Other Ambulatory Visit: Payer: Self-pay | Admitting: Physician Assistant

## 2021-03-21 ENCOUNTER — Other Ambulatory Visit: Payer: Self-pay | Admitting: Cardiovascular Disease

## 2021-03-21 MED FILL — CARVEDILOL 25 MG TABS: 25 | 90 days supply | Qty: 180 | Fill #0

## 2021-03-21 MED FILL — spIRONOLACTONE 25 MG TABS: 25 | 90 days supply | Qty: 90 | Fill #0

## 2021-04-05 ENCOUNTER — Other Ambulatory Visit (HOSPITAL_COMMUNITY): Payer: Self-pay

## 2021-04-11 ENCOUNTER — Other Ambulatory Visit (HOSPITAL_COMMUNITY): Payer: Self-pay

## 2021-04-11 ENCOUNTER — Other Ambulatory Visit: Payer: Self-pay | Admitting: Cardiovascular Disease

## 2021-04-12 ENCOUNTER — Other Ambulatory Visit (HOSPITAL_COMMUNITY): Payer: Self-pay

## 2021-04-12 MED ORDER — ROSUVASTATIN CALCIUM 10 MG PO TABS
10.0000 mg | ORAL_TABLET | Freq: Every day | ORAL | 0 refills | Status: DC
  Filled 2021-04-12: qty 90, 90d supply, fill #0

## 2021-04-12 MED ORDER — METFORMIN HCL 1000 MG PO TABS
ORAL_TABLET | ORAL | 0 refills | Status: DC
  Filled 2021-04-12: qty 225, 90d supply, fill #0

## 2021-04-13 ENCOUNTER — Other Ambulatory Visit (HOSPITAL_COMMUNITY): Payer: Self-pay

## 2021-04-16 ENCOUNTER — Other Ambulatory Visit (HOSPITAL_BASED_OUTPATIENT_CLINIC_OR_DEPARTMENT_OTHER): Payer: Self-pay

## 2021-04-17 ENCOUNTER — Other Ambulatory Visit (HOSPITAL_COMMUNITY): Payer: Self-pay

## 2021-04-17 MED FILL — Dulaglutide Soln Auto-injector 1.5 MG/0.5ML: SUBCUTANEOUS | 28 days supply | Qty: 2 | Fill #0 | Status: AC

## 2021-04-19 ENCOUNTER — Other Ambulatory Visit (HOSPITAL_COMMUNITY): Payer: Self-pay

## 2021-04-24 ENCOUNTER — Other Ambulatory Visit: Payer: Self-pay | Admitting: Cardiovascular Disease

## 2021-04-25 ENCOUNTER — Other Ambulatory Visit (HOSPITAL_COMMUNITY): Payer: Self-pay

## 2021-04-25 MED ORDER — QUINAPRIL HCL 40 MG PO TABS
80.0000 mg | ORAL_TABLET | Freq: Every day | ORAL | 0 refills | Status: DC
  Filled 2021-04-25: qty 180, 90d supply, fill #0
  Filled 2021-04-26: qty 170, 85d supply, fill #0
  Filled 2021-04-26: qty 10, 5d supply, fill #0

## 2021-04-25 MED FILL — Latanoprost Ophth Soln 0.005%: OPHTHALMIC | 25 days supply | Qty: 2.5 | Fill #0 | Status: AC

## 2021-04-26 ENCOUNTER — Other Ambulatory Visit (HOSPITAL_COMMUNITY): Payer: Self-pay

## 2021-05-01 ENCOUNTER — Other Ambulatory Visit (HOSPITAL_COMMUNITY): Payer: Self-pay

## 2021-05-31 ENCOUNTER — Other Ambulatory Visit (HOSPITAL_COMMUNITY): Payer: Self-pay

## 2021-06-01 ENCOUNTER — Other Ambulatory Visit (HOSPITAL_COMMUNITY): Payer: Self-pay

## 2021-06-13 ENCOUNTER — Other Ambulatory Visit (HOSPITAL_COMMUNITY): Payer: Self-pay

## 2021-06-13 ENCOUNTER — Other Ambulatory Visit: Payer: Self-pay | Admitting: Cardiovascular Disease

## 2021-06-13 MED ORDER — SPIRONOLACTONE 25 MG PO TABS
ORAL_TABLET | ORAL | 0 refills | Status: DC
  Filled 2021-06-13: qty 90, 90d supply, fill #0

## 2021-06-13 MED ORDER — CARVEDILOL 25 MG PO TABS
ORAL_TABLET | ORAL | 0 refills | Status: DC
  Filled 2021-06-13: qty 180, 90d supply, fill #0

## 2021-06-19 ENCOUNTER — Other Ambulatory Visit: Payer: Self-pay

## 2021-06-19 ENCOUNTER — Ambulatory Visit: Payer: Medicare Other | Admitting: Physician Assistant

## 2021-06-19 ENCOUNTER — Encounter: Payer: Self-pay | Admitting: Physician Assistant

## 2021-06-19 VITALS — BP 118/82 | HR 97 | Ht 70.0 in | Wt 213.8 lb

## 2021-06-19 DIAGNOSIS — I10 Essential (primary) hypertension: Secondary | ICD-10-CM | POA: Diagnosis not present

## 2021-06-19 DIAGNOSIS — E782 Mixed hyperlipidemia: Secondary | ICD-10-CM | POA: Diagnosis not present

## 2021-06-19 DIAGNOSIS — E118 Type 2 diabetes mellitus with unspecified complications: Secondary | ICD-10-CM

## 2021-06-19 DIAGNOSIS — I5020 Unspecified systolic (congestive) heart failure: Secondary | ICD-10-CM | POA: Diagnosis not present

## 2021-06-19 DIAGNOSIS — I2510 Atherosclerotic heart disease of native coronary artery without angina pectoris: Secondary | ICD-10-CM | POA: Diagnosis not present

## 2021-06-19 NOTE — Progress Notes (Signed)
Cardiology Office Note:    Date:  06/19/2021   ID:  Domingo Sep, DOB March 24, 1951, MRN [id number]  PCP:  [name], MD   [facility] Providers Cardiologist:  Lauree Chandler, MD      Referring MD: [name], *   Chief Complaint:  Follow-up (CAD, CHF)    Patient Profile:    [facility] is a 70 y.o. male with:  Coronary artery disease  S/p MI in 2005 s/p DES to LCx and unsuccessful PCI of CTO of LAD  s/p CABG in 12/15 (HFrEF) heart failure with reduced ejection fraction Echo 12/2018: EF 25-30, GR 1 DD, mild LAE Ischemic CM EP eval in past >> not ICD candidate w NYHA I symptoms; works near high [employer]) Hypertension  Hyperlipidemia  Diabetes mellitus   Prior CV studies: Echocardiogram 01/15/2019 EF 25-30, Gr 1 DD, mild LAE  Echocardiogram 10/04/2016 EF 25-30, inferolateral/inferior/inferoseptal akinesis, grade 1 diastolic dysfunction, mildly dilated ascending aorta (41 mm), moderate LAE, mild RVE, trivial TR  History of Present Illness: Mr. Morgenthaler was last seen by Dr. Angelena Form in 5/21.  He returns for follow-up.  He is here alone.  He has overall done well without chest pain, shortness of breath, syncope, orthopnea, leg edema.  He gets lightheaded if he bends over and stands up quickly.  He is retired now from [employer].         Past Medical History:  Diagnosis Date   Chronic systolic heart failure ([place]) 09/08/2015   2/2 Ischemic CM // Echo 09/2016 - EF 25-30, inferolateral/inferior/inferoseptal akinesis, grade 1 diastolic dysfunction, mildly dilated ascending aorta (41 mm), moderate LAE, mild RVE, trivial TR // Eval by EP (Dr. Lovena Le) in 2016 - no ICD (Pt NYHA 1 and works around high voltage)// Echo 12/2018: EF 25-30, diff HK, Gr 1 DD, mild LAE, mild TR    Coronary artery disease    s/p MI in 2005 tx with DES to LCx, unsuccessful PCI to LAD CTO // s/p CABG in 11/2014 (L-LAD, S-RCA, S-OM, S-Dx)    Fatty liver     Hyperlipidemia, mixed    Hypertension    Hypothyroidism    no longer on meds-not in 15 yrs.   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   carvedilol (COREG) 25 MG tablet TAKE 1 TABLET BY MOUTH 2 TIMES DAILY WITH A MEAL (NEEDS YEARLY APPT)   Dulaglutide 1.5 MG/0.5ML SOPN INJECT 1 PEN EVERY WEEK ON SAME DAY FOR DIABETES AND HELP WITH WEIGHT.   fluticasone (FLONASE) 50 MCG/ACT nasal spray Place 1 spray into both nostrils daily as needed for allergies or rhinitis.   latanoprost (XALATAN) 0.005 % ophthalmic solution INSTILL 1 DROP INTO BOTH EYES EVERY EVENING   loratadine (CLARITIN) 10 MG tablet Take 10 mg by mouth every morning.    metFORMIN (GLUCOPHAGE) 1000 MG tablet Take 1,000-1,500 mg by mouth See admin instructions. *TAKES 1000MG  IN THE MORNING AND 1500MG  IN THE EVENING*   nitroGLYCERIN (NITROSTAT) 0.4 MG SL tablet Place 1 tablet (0.4 mg total) under the tongue every 5 (five) minutes x 3 doses as  needed for chest pain.   quinapril (ACCUPRIL) 40 MG tablet Take 2 tablets (80 mg total) by mouth daily. Please keep upcoming appt in June 2022 before anymore refills. Thank you   rosuvastatin (CRESTOR) 10 MG tablet Take 1 tablet (10 mg total) by mouth daily. Please keep upcoming appt in June 2022 before anymore refills. Thank you   spironolactone (ALDACTONE) 25 MG tablet TAKE 1 TABLET BY MOUTH ONCE A DAY (NEEDS YEARLY APPT)     Allergies:   Patient has no known allergies.   Social History   Tobacco Use   Smoking status: Former    Packs/day: 0.50    Years: 30.00    Pack years: 15.00    Types: Cigarettes   Smokeless tobacco: Never   Tobacco comments:    "quit smoking cigarettes in ~ 2007"  Vaping Use   Vaping Use:  Never used  Substance Use Topics   Alcohol use: No   Drug use: No     Family Hx: The patient's family history includes Coronary artery disease (age of onset: 57) in his father; Heart attack in his paternal grandfather; Hypertension in his mother.  Review of Systems  Gastrointestinal:  Negative for hematochezia.  Genitourinary:  Negative for hematuria.    EKGs/Labs/Other Test Reviewed:    EKG:  EKG is   ordered today.  The ekg ordered today demonstrates NSR, HR 97, inferior Q waves, T wave inversions 1, 2, aVF, V5-V6 (similar to prior tracing), QTC 480  Recent Labs: No results found for requested labs within last 8760 hours.   Recent Lipid Panel Lab Results  Component Value Date/Time   CHOL 120 02/27/2018 08:24 AM   TRIG 128 02/27/2018 08:24 AM   HDL 37 (L) 02/27/2018 08:24 AM   LDLCALC 57 02/27/2018 08:24 AM   Labs from PCP - personally reviewed and interpreted: 06/03/2020: Creatinine 0.99, K+ 4.4, ALT 32, total cholesterol 153, triglycerides 200, HDL 31, LDL 88, A1c 7.2   Risk Assessment/Calculations:      Physical Exam:    VS:  BP 118/82   Pulse 97   Ht 5\' 10"  (1.778 m)   Wt 213 lb 12.8 oz (97 kg)   SpO2 97%   BMI 30.68 kg/m     Wt Readings from Last 3 Encounters:  06/19/21 213 lb 12.8 oz (97 kg)  05/18/20 226 lb (102.5 kg)  01/28/20 218 lb (98.9 kg)     Constitutional:      Appearance: Healthy appearance. Not in distress.  Neck:     Vascular: JVD normal.  Pulmonary:     Effort: Pulmonary effort is normal.     Breath sounds: No wheezing. No rales.  Cardiovascular:     Normal rate. Regular rhythm. Normal S1. Normal S2.      Murmurs: There is no murmur.  Edema:    Peripheral edema absent.  Abdominal:     Palpations: Abdomen is soft.  Skin:    General: Skin is warm and dry.  Neurological:     General: No focal deficit present.     Mental Status: Alert and oriented to person, place and time.     Cranial Nerves: Cranial nerves are intact.          ASSESSMENT & PLAN:    1. HFrEF (heart failure with reduced ejection fraction) (HCC) EF 25-30 by echocardiogram 12/2018.  He was evaluated by EP in the past and was not felt to be an ICD candidate given his exposure to high voltage  at his job with the [employer].  He was also an NYHA I.  He is now retired.  Question if we should send him back to EP for reconsideration of ICD.  I will review further with Dr. Angelena Form.  He could not afford dapagliflozin.  He has continued class I symptoms.  He is overall doing well on his current regimen.  Therefore, continue current dose of carvedilol, quinapril, spironolactone.  2. Coronary artery disease involving native coronary artery of native heart without angina pectoris Hx of MI in 2005 tx with DES to LCx and subsequent CABG in 2015.  He is doing well without anginal symptoms.  Continue aspirin, rosuvastatin, carvedilol.  Follow-up in 1 year.  3. Essential hypertension The patient's blood pressure is controlled on his current regimen.  Continue current therapy.   4. Mixed hyperlipidemia Goal LDL <70.  Continue statin therapy.  Request most recent labs from primary care.  If LDL continues to remain above 70, consider increasing rosuvastatin dose.  5. Type 2 diabetes  He could not afford dapagliflozin.  He remains on metformin.  A1c in June 2021 was 7.2.  Continue follow-up with primary care.         Dispo:  Return in about 1 year (around 06/19/2022) for Routine Follow Up w/ Dr. Angelena Form.   Medication Adjustments/Labs and Tests Ordered: Current medicines are reviewed at length with the patient today.  Concerns regarding medicines are outlined above.  Tests Ordered: Orders Placed This Encounter  Procedures   EKG 12-Lead    Medication Changes: No orders of the defined types were placed in this encounter.   Signed, Richardson Dopp, PA-C  06/19/2021 3:16 PM    [facility] [place], [place], [place]  [address] Phone:  [phone]; Fax: [phone]

## 2021-06-19 NOTE — Patient Instructions (Signed)
Medication Instructions:  Continue current medications.   *If you need a refill on your cardiac medications before your next appointment, please call your pharmacy*   Follow-Up: At [facility], you and your health needs are our priority.  As part of our continuing mission to provide you with exceptional heart care, we have created designated Provider Care Teams.  These Care Teams include your primary Cardiologist (physician) and Advanced Practice Providers (APPs -  Physician Assistants and Nurse Practitioners) who all work together to provide you with the care you need, when you need it.  We recommend signing up for the patient portal called "MyChart".  Sign up information is provided on this After Visit Summary.  MyChart is used to connect with patients for Virtual Visits (Telemedicine).  Patients are able to view lab/test results, encounter notes, upcoming appointments, etc.  Non-urgent messages can be sent to your provider as well.   To learn more about what you can do with MyChart, go to [email or web address].    Your next appointment:   12 month(s)  The format for your next appointment:   In Person  Provider:   Lauree Chandler, MD   Other Instructions

## 2021-06-21 ENCOUNTER — Telehealth: Payer: Self-pay | Admitting: Physician Assistant

## 2021-06-21 DIAGNOSIS — I5020 Unspecified systolic (congestive) heart failure: Secondary | ICD-10-CM

## 2021-06-21 NOTE — Telephone Encounter (Signed)
Please call pt. I reviewed everything with Dr. Angelena Form.  We think he should see EP again for consideration of ICD.  He used to work with the [employer] and was around high voltage.  So, ICD was avoided because of this.  Now he is retired and may be a candidate.  He saw Dr. Lovena Le in 2016. PLAN:  Please refer back to Dr. Lovena Le for consideration of ICD. Richardson Dopp, PA-C    06/21/2021 10:01 AM

## 2021-06-21 NOTE — Telephone Encounter (Signed)
Spoke with the patient and advised him that referral will be placed for him to see EP. Patient verbalized understanding.

## 2021-06-24 MED FILL — Dulaglutide Soln Auto-injector 1.5 MG/0.5ML: SUBCUTANEOUS | 28 days supply | Qty: 2 | Fill #1 | Status: AC

## 2021-06-24 MED FILL — Latanoprost Ophth Soln 0.005%: OPHTHALMIC | 25 days supply | Qty: 2.5 | Fill #1 | Status: AC

## 2021-06-25 ENCOUNTER — Other Ambulatory Visit (HOSPITAL_COMMUNITY): Payer: Self-pay

## 2021-06-26 ENCOUNTER — Other Ambulatory Visit (HOSPITAL_COMMUNITY): Payer: Self-pay

## 2021-06-27 ENCOUNTER — Other Ambulatory Visit (HOSPITAL_COMMUNITY): Payer: Self-pay

## 2021-06-28 ENCOUNTER — Other Ambulatory Visit: Payer: Self-pay | Admitting: *Deleted

## 2021-06-28 ENCOUNTER — Telehealth: Payer: Self-pay | Admitting: *Deleted

## 2021-06-28 DIAGNOSIS — E785 Hyperlipidemia, unspecified: Secondary | ICD-10-CM

## 2021-06-28 NOTE — Telephone Encounter (Signed)
Lvm for pt to call office to set up fasting lab work in the next several weeks. Orders placed in system pt just needs to set up date and labs need to be linked.

## 2021-06-28 NOTE — Telephone Encounter (Signed)
-----   Message from Liliane Shi, PA-C sent at 06/27/2021  5:43 PM EDT ----- Labs received from primary care.  Last set of lipids were performed 05/2020.  Please arrange fasting lipids in the next several weeks. Richardson Dopp, PA-C    06/27/2021 5:44 PM

## 2021-07-06 NOTE — Telephone Encounter (Signed)
S/w pt is coming in for fasting lipid next week.  Appt had already been but never received a message, orders linked.

## 2021-07-11 ENCOUNTER — Other Ambulatory Visit: Payer: Self-pay | Admitting: Cardiovascular Disease

## 2021-07-12 ENCOUNTER — Other Ambulatory Visit (HOSPITAL_COMMUNITY): Payer: Self-pay

## 2021-07-12 MED ORDER — ROSUVASTATIN CALCIUM 10 MG PO TABS
10.0000 mg | ORAL_TABLET | Freq: Every day | ORAL | 3 refills | Status: DC
  Filled 2021-07-12: qty 90, 90d supply, fill #0
  Filled 2021-10-12: qty 90, 90d supply, fill #1
  Filled 2022-01-14: qty 90, 90d supply, fill #2
  Filled 2022-04-15: qty 90, 90d supply, fill #3

## 2021-07-12 MED ORDER — QUINAPRIL HCL 40 MG PO TABS
80.0000 mg | ORAL_TABLET | Freq: Every day | ORAL | 3 refills | Status: DC
  Filled 2021-07-12 – 2021-07-14 (×2): qty 180, 90d supply, fill #0
  Filled 2021-10-18: qty 90, 45d supply, fill #1
  Filled 2021-12-08: qty 90, 45d supply, fill #2

## 2021-07-13 ENCOUNTER — Other Ambulatory Visit: Payer: Self-pay

## 2021-07-13 ENCOUNTER — Other Ambulatory Visit: Payer: Medicare Other | Admitting: *Deleted

## 2021-07-13 DIAGNOSIS — E785 Hyperlipidemia, unspecified: Secondary | ICD-10-CM

## 2021-07-13 LAB — LIPID PANEL
Chol/HDL Ratio: 3.5 ratio (ref 0.0–5.0)
Cholesterol, Total: 111 mg/dL (ref 100–199)
HDL: 32 mg/dL — ABNORMAL LOW (ref >39)
LDL Chol Calc (NIH): 53 mg/dL (ref 0–99)
Triglycerides: 148 mg/dL (ref 0–149)
VLDL Cholesterol Cal: 26 mg/dL (ref 5–40)

## 2021-07-14 ENCOUNTER — Other Ambulatory Visit (HOSPITAL_COMMUNITY): Payer: Self-pay

## 2021-07-17 ENCOUNTER — Other Ambulatory Visit (HOSPITAL_COMMUNITY): Payer: Self-pay

## 2021-07-19 ENCOUNTER — Other Ambulatory Visit (HOSPITAL_COMMUNITY): Payer: Self-pay

## 2021-07-19 MED ORDER — METFORMIN HCL 1000 MG PO TABS
ORAL_TABLET | ORAL | 0 refills | Status: DC
  Filled 2021-07-19: qty 225, 90d supply, fill #0

## 2021-07-23 ENCOUNTER — Other Ambulatory Visit (HOSPITAL_COMMUNITY): Payer: Self-pay

## 2021-07-23 MED FILL — Dulaglutide Soln Auto-injector 1.5 MG/0.5ML: SUBCUTANEOUS | 28 days supply | Qty: 2 | Fill #2 | Status: AC

## 2021-08-03 ENCOUNTER — Other Ambulatory Visit: Payer: Self-pay

## 2021-08-03 ENCOUNTER — Ambulatory Visit: Payer: Medicare Other | Admitting: Internal Medicine

## 2021-08-03 VITALS — BP 146/86 | HR 83 | Ht 70.0 in | Wt 213.8 lb

## 2021-08-03 DIAGNOSIS — I5022 Chronic systolic (congestive) heart failure: Secondary | ICD-10-CM

## 2021-08-03 NOTE — Patient Instructions (Addendum)
Medication Instructions:  Your physician recommends that you continue on your current medications as directed. Please refer to the Current Medication list given to you today.  Labwork: None ordered.  Testing/Procedures: Your physician has recommended that you have a defibrillator inserted. An implantable cardioverter defibrillator (ICD) is a small device that is placed in your chest or, in rare cases, your abdomen. This device uses electrical pulses or shocks to help control life-threatening, irregular heartbeats that could lead the heart to suddenly stop beating (sudden cardiac arrest). Leads are attached to the ICD that goes into your heart. This is done in the hospital and usually requires an overnight stay. Please see the instruction sheet given to you today for more information.  Follow-Up:  Give me a call when you would like to schedule your procedure.  Sonia Baller RN  Any Other Special Instructions Will Be Listed Below (If Applicable).  If you need a refill on your cardiac medications before your next appointment, please call your pharmacy.

## 2021-08-03 NOTE — Progress Notes (Signed)
HPI [facility] presents today for evaluation of his chronic systolic heart failure and ischemic CM, to discuss ICD insertion. He has an EF of 25% dating back over 10 years. I saw him in 2016 when he was working for [employer] and ultimately decided not to proceed with ICD as there was concern about the need to retire out of concern for the high voltage. He remains with class 1 symptoms. He has not had syncope. He notes that he is separated from his wife and will likely be divorcing. He denies peripheral edema. No Known Allergies   Current Outpatient Medications  Medication Sig Dispense Refill   aspirin EC 81 MG tablet Take 1 tablet (81 mg total) by mouth daily. 90 tablet 3   carvedilol (COREG) 25 MG tablet TAKE 1 TABLET BY MOUTH 2 TIMES DAILY WITH A MEAL (NEEDS YEARLY APPT) 180 tablet 0   Dulaglutide 1.5 MG/0.5ML SOPN INJECT 1 PEN EVERY WEEK ON SAME DAY FOR DIABETES AND HELP WITH WEIGHT. 6 mL 1   fluticasone (FLONASE) 50 MCG/ACT nasal spray Place 1 spray into both nostrils daily as needed for allergies or rhinitis.     latanoprost (XALATAN) 0.005 % ophthalmic solution INSTILL 1 DROP INTO BOTH EYES EVERY EVENING 2.5 mL 3   loratadine (CLARITIN) 10 MG tablet Take 10 mg by mouth every morning.      metFORMIN (GLUCOPHAGE) 1000 MG tablet Take 1,000-1,500 mg by mouth See admin instructions. *TAKES '1000MG'$  IN THE MORNING AND '1500MG'$  IN THE EVENING*     metFORMIN (GLUCOPHAGE) 1000 MG tablet Take 1 tablet by mouth with breakfast, and 1 and 1/2 tablets by mouth with supper. 225 tablet 0   nitroGLYCERIN (NITROSTAT) 0.4 MG SL tablet Place 1 tablet (0.4 mg total) under the tongue every 5 (five) minutes x 3 doses as needed for chest pain. 25 tablet 5   quinapril (ACCUPRIL) 40 MG tablet Take 2 tablets (80 mg total) by mouth daily. 180 tablet 3   rosuvastatin (CRESTOR) 10 MG tablet Take 1 tablet (10 mg total) by mouth daily. 90 tablet 3   spironolactone (ALDACTONE) 25 MG tablet TAKE 1 TABLET BY MOUTH ONCE  A DAY (NEEDS YEARLY APPT) 90 tablet 0   No current facility-administered medications for this visit.     Past Medical History:  Diagnosis Date   Chronic systolic heart failure ([facility]) 09/08/2015   2/2 Ischemic CM // Echo 09/2016 - EF 25-30, inferolateral/inferior/inferoseptal akinesis, grade 1 diastolic dysfunction, mildly dilated ascending aorta (41 mm), moderate LAE, mild RVE, trivial TR // Eval by EP (Dr. Lovena Ellis) in 2016 - no ICD (Pt NYHA 1 and works around high voltage)// Echo 12/2018: EF 25-30, diff HK, Gr 1 DD, mild LAE, mild TR    Coronary artery disease    s/p MI in 2005 tx with DES to LCx, unsuccessful PCI to LAD CTO // s/p CABG in 11/2014 (L-LAD, S-RCA, S-OM, S-Dx)    Fatty liver    Hyperlipidemia, mixed    Hypertension    Hypothyroidism    no longer on meds-not in 15 yrs.   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   Past Surgical  History:  Procedure Laterality Date   CARDIAC CATHETERIZATION  12/07/2014   "recommending about [facility]"   COLONOSCOPY WITH PROPOFOL N/A 03/29/2016   Procedure: COLONOSCOPY WITH PROPOFOL;  Surgeon: Zachary Stabler, MD;  Location: [facility];  Service: Gastroenterology;  Laterality: N/A;   CORONARY ANGIOPLASTY  01/2004   failed LAD PCI   CORONARY ANGIOPLASTY WITH STENT PLACEMENT  12/2003   CFX   CORONARY ARTERY BYPASS GRAFT N/A 12/09/2014   Procedure: CORONARY ARTERY BYPASS GRAFTING (CABG), ON PUMP, TIMES FOUR, USING LEFT INTERNAL MAMMARY ARTERY, RIGHT GREATER SAPHENOUS VEIN HARVESTED ENDOSCOPICALLY;  Surgeon: Zachary Alberts, MD;  Location: [facility];  Service: Open Heart Surgery;  Laterality: N/A;   INTRAOPERATIVE TRANSESOPHAGEAL ECHOCARDIOGRAM N/A 12/09/2014   Procedure: INTRAOPERATIVE TRANSESOPHAGEAL ECHOCARDIOGRAM;  Surgeon:  Zachary Alberts, MD;  Location: [facility];  Service: Open Heart Surgery;  Laterality: N/A;   LEFT HEART CATHETERIZATION WITH CORONARY ANGIOGRAM N/A 12/07/2014   Procedure: LEFT HEART CATHETERIZATION WITH CORONARY ANGIOGRAM;  Surgeon: Zachary Blanks, MD;  Location: [facility];  Service: Cardiovascular;  Laterality: N/A;   WISDOM TOOTH EXTRACTION  1980's   'all 4"     Family History  Problem Relation Age of Onset   Coronary artery disease Father 14   Hypertension Mother    Heart attack Paternal Grandfather        x2 (MI at age 70 was his cause of death)     Social History   Socioeconomic History   Marital status: Single    Spouse name: Not on file   Number of children: 2   Years of education: Not on file   Highest education level: Not on file  Occupational History   Occupation: [employer]: OTHER    Employer: [employer]  Tobacco Use   Smoking status: Former    Packs/day: 0.50    Years: 30.00    Pack years: 15.00    Types: Cigarettes   Smokeless tobacco: Never   Tobacco comments:    "quit smoking cigarettes in ~ 2007"  Vaping Use   Vaping Use: Never used  Substance and Sexual Activity   Alcohol use: No   Drug use: No   Sexual activity: Yes  Other Topics Concern   Not on file  Social History Narrative   Not on file   Social Determinants of Health   Financial Resource Strain: Not on file  Food Insecurity: Not on file  Transportation Needs: Not on file  Physical Activity: Not on file  Stress: Not on file  Social Connections: Not on file  Intimate Partner Violence: Not on file     BP (!) 146/86   Pulse 83   Ht '5\' 10"'$  (1.778 m)   Wt 213 lb 12.8 oz (97 kg)   SpO2 96%   BMI 30.68 kg/m   Physical Exam:  Well appearing NAD HEENT: Unremarkable Neck:  No JVD, no thyromegally Lymphatics:  No adenopathy Back:  No CVA tenderness Lungs:  Clear HEART:  Regular rate rhythm, no murmurs, no rubs, no clicks Abd:  soft, positive bowel  sounds, no organomegally, no rebound, no guarding Ext:  2 plus pulses, no edema, no cyanosis, no clubbing Skin:  No rashes no nodules Neuro:  CN II through XII intact, motor grossly intact  EKG - nsr  Assess/Plan:  ICM - he has a MADIT 2 indication for ICD insertion and I have discussed the risk/benefits/goals/expectations of the procedure and he will call us if  he wishes to proceed. Chronic systolic heart failure -despite an EF of 25% and maximal guideline directed medical therapy, he has class 1 symptoms. He will continue his current meds.  Carleene Overlie Kenyon Eichelberger,MD

## 2021-08-20 ENCOUNTER — Other Ambulatory Visit (HOSPITAL_COMMUNITY): Payer: Self-pay

## 2021-08-20 MED ORDER — TRULICITY 3 MG/0.5ML ~~LOC~~ SOAJ
SUBCUTANEOUS | 0 refills | Status: DC
  Filled 2021-08-20: qty 2, 28d supply, fill #0
  Filled 2021-09-16 – 2021-10-18 (×2): qty 2, 28d supply, fill #1
  Filled 2021-11-12: qty 2, 28d supply, fill #2

## 2021-08-21 ENCOUNTER — Other Ambulatory Visit (HOSPITAL_COMMUNITY): Payer: Self-pay

## 2021-08-23 ENCOUNTER — Other Ambulatory Visit (HOSPITAL_COMMUNITY): Payer: Self-pay

## 2021-08-23 MED ORDER — LATANOPROST 0.005 % OP SOLN
OPHTHALMIC | 3 refills | Status: DC
  Filled 2021-08-23: qty 2.5, 25d supply, fill #0
  Filled 2021-10-18: qty 2.5, 25d supply, fill #1
  Filled 2021-12-24: qty 2.5, 25d supply, fill #2
  Filled 2022-02-23: qty 2.5, 25d supply, fill #3

## 2021-08-24 ENCOUNTER — Other Ambulatory Visit (HOSPITAL_COMMUNITY): Payer: Self-pay

## 2021-08-28 ENCOUNTER — Other Ambulatory Visit (HOSPITAL_COMMUNITY): Payer: Self-pay

## 2021-09-16 ENCOUNTER — Other Ambulatory Visit: Payer: Self-pay | Admitting: Cardiovascular Disease

## 2021-09-17 ENCOUNTER — Other Ambulatory Visit (HOSPITAL_COMMUNITY): Payer: Self-pay

## 2021-09-17 MED ORDER — CARVEDILOL 25 MG PO TABS
ORAL_TABLET | ORAL | 3 refills | Status: DC
  Filled 2021-09-17: qty 180, 90d supply, fill #0
  Filled 2021-12-22: qty 180, 90d supply, fill #1
  Filled 2022-03-24: qty 180, 90d supply, fill #2
  Filled 2022-06-25: qty 180, 90d supply, fill #3

## 2021-09-17 MED ORDER — SPIRONOLACTONE 25 MG PO TABS
ORAL_TABLET | ORAL | 3 refills | Status: DC
  Filled 2021-09-17: qty 90, 90d supply, fill #0
  Filled 2021-12-08: qty 90, 90d supply, fill #1
  Filled 2022-03-10: qty 90, 90d supply, fill #2
  Filled 2022-06-18: qty 90, 90d supply, fill #3

## 2021-09-18 ENCOUNTER — Other Ambulatory Visit (HOSPITAL_COMMUNITY): Payer: Self-pay

## 2021-10-12 ENCOUNTER — Other Ambulatory Visit (HOSPITAL_COMMUNITY): Payer: Self-pay

## 2021-10-12 MED ORDER — METFORMIN HCL 1000 MG PO TABS
ORAL_TABLET | ORAL | 0 refills | Status: DC
  Filled 2021-10-12: qty 225, 90d supply, fill #0

## 2021-10-16 ENCOUNTER — Other Ambulatory Visit (HOSPITAL_COMMUNITY): Payer: Self-pay

## 2021-10-18 ENCOUNTER — Other Ambulatory Visit (HOSPITAL_COMMUNITY): Payer: Self-pay

## 2021-11-06 ENCOUNTER — Other Ambulatory Visit (HOSPITAL_COMMUNITY): Payer: Self-pay

## 2021-11-12 ENCOUNTER — Other Ambulatory Visit (HOSPITAL_COMMUNITY): Payer: Self-pay

## 2021-12-08 ENCOUNTER — Other Ambulatory Visit (HOSPITAL_COMMUNITY): Payer: Self-pay

## 2021-12-10 ENCOUNTER — Other Ambulatory Visit (HOSPITAL_COMMUNITY): Payer: Self-pay

## 2021-12-10 ENCOUNTER — Telehealth: Payer: Self-pay | Admitting: Cardiovascular Disease

## 2021-12-10 ENCOUNTER — Other Ambulatory Visit: Payer: Self-pay | Admitting: Physician Assistant

## 2021-12-10 MED ORDER — QUINAPRIL HCL 40 MG PO TABS: 80.0000 mg | ORAL_TABLET | Freq: Every day | ORAL | 3 refills | Status: DC

## 2021-12-10 NOTE — Telephone Encounter (Signed)
Reviewed with PharmD who was not aware of a shortage with plain Accupril.  Called patient.  He also uses [facility] in [place].  Sent prescription for accupril there.  He will check with them to make sure they have a supply and will call back if anything further is needed.

## 2021-12-10 NOTE — Telephone Encounter (Signed)
Pt c/o medication issue:  1. Name of Medication: quinapril (ACCUPRIL) 40 MG tablet  2. How are you currently taking this medication (dosage and times per day)? 2 tablets daily  3. Are you having a reaction (difficulty breathing--STAT)? no  4. What is your medication issue? Patient states his pharmacy told them the medication is on back order and they do not know when they will be able to get it. He states he is completely out of medication.

## 2021-12-18 ENCOUNTER — Other Ambulatory Visit (HOSPITAL_COMMUNITY): Payer: Self-pay

## 2021-12-18 MED ORDER — DULAGLUTIDE 3 MG/0.5ML ~~LOC~~ SOAJ
SUBCUTANEOUS | 0 refills | Status: DC
  Filled 2021-12-18: qty 2, 28d supply, fill #0
  Filled 2022-01-17: qty 2, 28d supply, fill #1
  Filled 2022-02-13: qty 2, 28d supply, fill #2

## 2021-12-23 ENCOUNTER — Other Ambulatory Visit (HOSPITAL_COMMUNITY): Payer: Self-pay

## 2021-12-24 ENCOUNTER — Other Ambulatory Visit (HOSPITAL_COMMUNITY): Payer: Self-pay

## 2022-01-08 ENCOUNTER — Other Ambulatory Visit (HOSPITAL_COMMUNITY): Payer: Self-pay

## 2022-01-08 MED ORDER — PREDNISOLONE ACETATE 1 % OP SUSP
OPHTHALMIC | 0 refills | Status: DC
  Filled 2022-01-08: qty 5, 22d supply, fill #0

## 2022-01-14 ENCOUNTER — Other Ambulatory Visit (HOSPITAL_COMMUNITY): Payer: Self-pay

## 2022-01-15 ENCOUNTER — Other Ambulatory Visit (HOSPITAL_COMMUNITY): Payer: Self-pay

## 2022-01-15 MED ORDER — METFORMIN HCL 1000 MG PO TABS
ORAL_TABLET | ORAL | 0 refills | Status: DC
  Filled 2022-01-15: qty 225, 90d supply, fill #0

## 2022-01-17 ENCOUNTER — Other Ambulatory Visit (HOSPITAL_COMMUNITY): Payer: Self-pay

## 2022-01-21 ENCOUNTER — Other Ambulatory Visit (HOSPITAL_COMMUNITY): Payer: Self-pay

## 2022-01-25 ENCOUNTER — Other Ambulatory Visit (HOSPITAL_COMMUNITY): Payer: Self-pay

## 2022-01-25 ENCOUNTER — Telehealth: Payer: Self-pay

## 2022-01-25 MED ORDER — TAMSULOSIN HCL 0.4 MG PO CAPS
ORAL_CAPSULE | ORAL | 5 refills | Status: DC
  Filled 2022-01-25: qty 30, 30d supply, fill #0
  Filled 2022-03-10: qty 30, 30d supply, fill #1
  Filled 2022-04-11: qty 30, 30d supply, fill #2
  Filled 2022-05-13: qty 30, 30d supply, fill #3
  Filled 2022-06-13: qty 30, 30d supply, fill #4
  Filled 2022-07-15: qty 30, 30d supply, fill #5

## 2022-01-25 MED ORDER — LISINOPRIL 40 MG PO TABS
40.0000 mg | ORAL_TABLET | Freq: Every day | ORAL | 3 refills | Status: DC
  Filled 2022-01-25: qty 90, 90d supply, fill #0
  Filled 2022-04-15: qty 90, 90d supply, fill #1
  Filled 2022-07-24: qty 90, 90d supply, fill #2
  Filled 2022-10-17: qty 90, 90d supply, fill #3

## 2022-01-25 MED ORDER — SULFAMETHOXAZOLE-TRIMETHOPRIM 800-160 MG PO TABS
ORAL_TABLET | ORAL | 0 refills | Status: DC
  Filled 2022-01-25: qty 20, 10d supply, fill #0

## 2022-01-25 NOTE — Telephone Encounter (Signed)
Pt called into the refill department stating that he is having trouble getting refills on his Quinapril. He states that none of the pharmacy's have the medication in stock and he only has enough left to last him till tomorrow. Please advise

## 2022-01-25 NOTE — Telephone Encounter (Signed)
Pt unable to find quinapril.  Discussed with pharmacy.  Substitute is lisinopril 40 mg PO daily.  Pt advised and ok with switch.  Sent new prescription as requested.

## 2022-01-28 ENCOUNTER — Other Ambulatory Visit (HOSPITAL_COMMUNITY): Payer: Self-pay

## 2022-01-28 MED ORDER — CIPROFLOXACIN HCL 500 MG PO TABS
ORAL_TABLET | ORAL | 0 refills | Status: DC
  Filled 2022-01-28: qty 20, 10d supply, fill #0

## 2022-02-13 ENCOUNTER — Other Ambulatory Visit (HOSPITAL_COMMUNITY): Payer: Self-pay

## 2022-02-15 ENCOUNTER — Other Ambulatory Visit (HOSPITAL_COMMUNITY): Payer: Self-pay

## 2022-02-18 ENCOUNTER — Other Ambulatory Visit (HOSPITAL_COMMUNITY): Payer: Self-pay

## 2022-02-23 ENCOUNTER — Other Ambulatory Visit (HOSPITAL_COMMUNITY): Payer: Self-pay

## 2022-02-27 ENCOUNTER — Other Ambulatory Visit (HOSPITAL_COMMUNITY): Payer: Self-pay

## 2022-03-11 ENCOUNTER — Other Ambulatory Visit (HOSPITAL_COMMUNITY): Payer: Self-pay

## 2022-03-12 ENCOUNTER — Other Ambulatory Visit (HOSPITAL_COMMUNITY): Payer: Self-pay

## 2022-03-19 ENCOUNTER — Other Ambulatory Visit (HOSPITAL_COMMUNITY): Payer: Self-pay

## 2022-03-20 ENCOUNTER — Other Ambulatory Visit (HOSPITAL_COMMUNITY): Payer: Self-pay

## 2022-03-20 MED ORDER — TRULICITY 3 MG/0.5ML ~~LOC~~ SOAJ
SUBCUTANEOUS | 1 refills | Status: DC
  Filled 2022-03-20: qty 2, 28d supply, fill #0
  Filled 2022-04-22: qty 2, 28d supply, fill #1
  Filled 2022-05-22: qty 2, 28d supply, fill #2

## 2022-03-22 ENCOUNTER — Other Ambulatory Visit (HOSPITAL_COMMUNITY): Payer: Self-pay

## 2022-03-24 ENCOUNTER — Other Ambulatory Visit (HOSPITAL_COMMUNITY): Payer: Self-pay

## 2022-03-25 ENCOUNTER — Other Ambulatory Visit (HOSPITAL_COMMUNITY): Payer: Self-pay

## 2022-04-11 ENCOUNTER — Other Ambulatory Visit (HOSPITAL_COMMUNITY): Payer: Self-pay

## 2022-04-15 ENCOUNTER — Other Ambulatory Visit (HOSPITAL_COMMUNITY): Payer: Self-pay

## 2022-04-15 MED ORDER — METFORMIN HCL 1000 MG PO TABS
ORAL_TABLET | ORAL | 0 refills | Status: DC
  Filled 2022-04-15: qty 225, 90d supply, fill #0

## 2022-04-22 ENCOUNTER — Other Ambulatory Visit (HOSPITAL_COMMUNITY): Payer: Self-pay

## 2022-04-24 ENCOUNTER — Other Ambulatory Visit (HOSPITAL_COMMUNITY): Payer: Self-pay

## 2022-04-25 ENCOUNTER — Other Ambulatory Visit (HOSPITAL_COMMUNITY): Payer: Self-pay

## 2022-04-29 ENCOUNTER — Other Ambulatory Visit (HOSPITAL_COMMUNITY): Payer: Self-pay

## 2022-04-29 MED ORDER — LATANOPROST 0.005 % OP SOLN
OPHTHALMIC | 2 refills | Status: DC
  Filled 2022-04-29: qty 2.5, 25d supply, fill #0
  Filled 2022-06-21: qty 2.5, 25d supply, fill #1
  Filled 2022-08-16: qty 2.5, 25d supply, fill #2

## 2022-04-30 ENCOUNTER — Other Ambulatory Visit (HOSPITAL_COMMUNITY): Payer: Self-pay

## 2022-05-13 ENCOUNTER — Other Ambulatory Visit (HOSPITAL_COMMUNITY): Payer: Self-pay

## 2022-05-22 ENCOUNTER — Other Ambulatory Visit (HOSPITAL_COMMUNITY): Payer: Self-pay

## 2022-05-29 ENCOUNTER — Other Ambulatory Visit (HOSPITAL_COMMUNITY): Payer: Self-pay

## 2022-05-29 MED ORDER — VICTOZA 18 MG/3ML ~~LOC~~ SOPN
PEN_INJECTOR | SUBCUTANEOUS | 0 refills | Status: DC
  Filled 2022-05-29: qty 3, 30d supply, fill #0

## 2022-05-31 ENCOUNTER — Other Ambulatory Visit (HOSPITAL_COMMUNITY): Payer: Self-pay

## 2022-06-13 ENCOUNTER — Other Ambulatory Visit (HOSPITAL_COMMUNITY): Payer: Self-pay

## 2022-06-18 ENCOUNTER — Other Ambulatory Visit (HOSPITAL_COMMUNITY): Payer: Self-pay

## 2022-06-21 ENCOUNTER — Other Ambulatory Visit (HOSPITAL_COMMUNITY): Payer: Self-pay

## 2022-06-22 ENCOUNTER — Other Ambulatory Visit (HOSPITAL_COMMUNITY): Payer: Self-pay

## 2022-06-22 MED ORDER — VICTOZA 18 MG/3ML ~~LOC~~ SOPN
1.8000 mg | PEN_INJECTOR | Freq: Every day | SUBCUTANEOUS | 0 refills | Status: DC
  Filled 2022-06-22: qty 3, 10d supply, fill #0

## 2022-06-26 ENCOUNTER — Other Ambulatory Visit (HOSPITAL_COMMUNITY): Payer: Self-pay

## 2022-06-28 ENCOUNTER — Other Ambulatory Visit (HOSPITAL_COMMUNITY): Payer: Self-pay

## 2022-07-02 ENCOUNTER — Other Ambulatory Visit (HOSPITAL_BASED_OUTPATIENT_CLINIC_OR_DEPARTMENT_OTHER): Payer: Self-pay

## 2022-07-10 ENCOUNTER — Other Ambulatory Visit (HOSPITAL_COMMUNITY): Payer: Self-pay

## 2022-07-10 ENCOUNTER — Other Ambulatory Visit: Payer: Self-pay | Admitting: Physician Assistant

## 2022-07-10 MED ORDER — ROSUVASTATIN CALCIUM 10 MG PO TABS
10.0000 mg | ORAL_TABLET | Freq: Every day | ORAL | 0 refills | Status: DC
  Filled 2022-07-10: qty 30, 30d supply, fill #0

## 2022-07-15 ENCOUNTER — Other Ambulatory Visit (HOSPITAL_COMMUNITY): Payer: Self-pay

## 2022-07-15 MED ORDER — PIOGLITAZONE HCL 15 MG PO TABS
ORAL_TABLET | ORAL | 0 refills | Status: DC
  Filled 2022-07-15: qty 90, 90d supply, fill #0

## 2022-07-16 ENCOUNTER — Other Ambulatory Visit (HOSPITAL_COMMUNITY): Payer: Self-pay

## 2022-07-16 MED ORDER — METFORMIN HCL 1000 MG PO TABS
ORAL_TABLET | ORAL | 0 refills | Status: DC
  Filled 2022-07-16: qty 225, 90d supply, fill #0

## 2022-07-17 ENCOUNTER — Other Ambulatory Visit (HOSPITAL_COMMUNITY): Payer: Self-pay

## 2022-07-24 ENCOUNTER — Other Ambulatory Visit (HOSPITAL_COMMUNITY): Payer: Self-pay

## 2022-07-26 ENCOUNTER — Other Ambulatory Visit (HOSPITAL_COMMUNITY): Payer: Self-pay

## 2022-07-26 DIAGNOSIS — R351 Nocturia: Secondary | ICD-10-CM | POA: Diagnosis not present

## 2022-07-26 DIAGNOSIS — N401 Enlarged prostate with lower urinary tract symptoms: Secondary | ICD-10-CM | POA: Diagnosis not present

## 2022-07-26 DIAGNOSIS — R972 Elevated prostate specific antigen [PSA]: Secondary | ICD-10-CM | POA: Diagnosis not present

## 2022-07-26 MED ORDER — TAMSULOSIN HCL 0.4 MG PO CAPS
ORAL_CAPSULE | ORAL | 3 refills | Status: DC
  Filled 2022-07-26: qty 90, 90d supply, fill #0
  Filled 2022-11-09: qty 90, 90d supply, fill #1
  Filled 2023-02-11: qty 90, 90d supply, fill #2
  Filled 2023-05-02: qty 90, 90d supply, fill #3

## 2022-07-29 ENCOUNTER — Encounter: Payer: Self-pay | Admitting: Cardiovascular Disease

## 2022-07-29 ENCOUNTER — Ambulatory Visit: Payer: Medicare Other | Admitting: Cardiovascular Disease

## 2022-07-29 VITALS — BP 146/86 | HR 83 | Ht 70.0 in | Wt 219.2 lb

## 2022-07-29 DIAGNOSIS — I5020 Unspecified systolic (congestive) heart failure: Secondary | ICD-10-CM

## 2022-07-29 DIAGNOSIS — I2510 Atherosclerotic heart disease of native coronary artery without angina pectoris: Secondary | ICD-10-CM | POA: Diagnosis not present

## 2022-07-29 DIAGNOSIS — I10 Essential (primary) hypertension: Secondary | ICD-10-CM

## 2022-07-29 DIAGNOSIS — E782 Mixed hyperlipidemia: Secondary | ICD-10-CM | POA: Diagnosis not present

## 2022-07-29 NOTE — Patient Instructions (Signed)
Medication Instructions:  No changes *If you need a refill on your cardiac medications before your next appointment, please call your pharmacy*   Lab Work: none If you have labs (blood work) drawn today and your tests are completely normal, you will receive your results only by: [place] (if you have MyChart) OR A paper copy in the mail If you have any lab test that is abnormal or we need to change your treatment, we will call you to review the results.   Testing/Procedures: none   Follow-Up: At [facility], you and your health needs are our priority.  As part of our continuing mission to provide you with exceptional heart care, we have created designated Provider Care Teams.  These Care Teams include your primary Cardiologist (physician) and Advanced Practice Providers (APPs -  Physician Assistants and Nurse Practitioners) who all work together to provide you with the care you need, when you need it.  We recommend signing up for the patient portal called "MyChart".  Sign up information is provided on this After Visit Summary.  MyChart is used to connect with patients for Virtual Visits (Telemedicine).  Patients are able to view lab/test results, encounter notes, upcoming appointments, etc.  Non-urgent messages can be sent to your provider as well.   To learn more about what you can do with MyChart, go to [email or web address].    Your next appointment:   12 month(s)  The format for your next appointment:   In Person  Provider:   Lauree Chandler, MD     Important Information About Sugar

## 2022-07-29 NOTE — Progress Notes (Signed)
Chief Complaint  Patient presents with   Follow-up    CAD    History of Present Illness: 71 yo male with history of CAD s/p 4V CABG, HTN, HLD, DM here today for cardiac follow up. 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 There was an unsuccessful attempt at PCI of a chronically occluded LAD. He was admitted to [facility] December 2015 with unstable angina and CHF. Cardiac cath demonstrated severe three vessel CAD. He underwent 4V CABG (LIMA to LAD, SVG to RCA, SVG to OM and SVG to Diagonal). LVEF was 35% by cath in 2015. Echo in March 2016 with LVEF 20-25%. He has been seen in the EP clinic by Dr. Lovena Le but no ICD placed as he has had NYHA class 1 symptoms. 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 Echo January 2020 with LVEF=25-30%, no valve disease. He was seen again by Dr. Lovena Le in the EP clinic in August 2022. ICD was offered as he is now retired from his job with high voltage. He elected not to have an ICD placed.   He is here today for follow up. The patient denies any chest pain, dyspnea, palpitations, lower extremity edema, orthopnea, PND, dizziness, near syncope or syncope.    Primary Care Physician: [name], MD  Past Medical History:  Diagnosis Date   Chronic systolic heart failure ([place]) 09/08/2015   2/2 Ischemic CM // Echo 09/2016 - EF 25-30, inferolateral/inferior/inferoseptal akinesis, grade 1 diastolic dysfunction, mildly dilated ascending aorta (41 mm), moderate LAE, mild RVE, trivial TR // Eval by EP (Dr. Lovena Le) in 2016 - no ICD (Pt NYHA 1 and works around high voltage)// Echo 12/2018: EF 25-30, diff HK, Gr 1 DD, mild LAE, mild TR    Coronary artery disease    s/p MI in 2005 tx with DES to LCx, unsuccessful PCI to LAD CTO // s/p CABG in 11/2014 (L-LAD, S-RCA, S-OM, S-Dx)    Fatty liver    Hyperlipidemia, mixed    Hypertension    Hypothyroidism    no longer on meds-not in 15 yrs.   Kidney stones     "passed on my own" (12-22-2014)   S/P CABG x 4 12/09/2014   LIMA to LAD, SVG to Diag, SVG to OM, SVG to RCA w/ EVH via right thigh and leg   Type II diabetes mellitus ([place])     Past Surgical History:  Procedure Laterality Date   CARDIAC CATHETERIZATION  12/22/2014   "recommending about [facility]"   COLONOSCOPY WITH PROPOFOL N/A 03/29/2016   Procedure: COLONOSCOPY WITH PROPOFOL;  Surgeon: Doran Stabler, MD;  Location: [facility];  Service: Gastroenterology;  Laterality: N/A;   CORONARY ANGIOPLASTY  01/2004   failed LAD PCI   CORONARY ANGIOPLASTY WITH STENT PLACEMENT  12/2003   CFX   CORONARY ARTERY BYPASS GRAFT N/A 12/09/2014   Procedure: CORONARY ARTERY BYPASS GRAFTING (CABG), ON PUMP, TIMES FOUR, USING LEFT INTERNAL MAMMARY ARTERY, RIGHT GREATER SAPHENOUS VEIN HARVESTED ENDOSCOPICALLY;  Surgeon: Rexene Alberts, MD;  Location: [place];  Service: Open Heart Surgery;  Laterality: N/A;   INTRAOPERATIVE TRANSESOPHAGEAL ECHOCARDIOGRAM N/A 12/09/2014   Procedure: INTRAOPERATIVE TRANSESOPHAGEAL ECHOCARDIOGRAM;  Surgeon: Rexene Alberts, MD;  Location: [place];  Service: Open Heart Surgery;  Laterality: N/A;   LEFT HEART CATHETERIZATION WITH CORONARY ANGIOGRAM N/A Dec 22, 2014   Procedure: LEFT HEART CATHETERIZATION WITH CORONARY ANGIOGRAM;  Surgeon: Burnell Blanks, MD;  Location: [facility]  CATH LAB;  Service: Cardiovascular;  Laterality: N/A;   WISDOM TOOTH EXTRACTION  1980's   'all 4"    Current Outpatient Medications  Medication Sig Dispense Refill   aspirin EC 81 MG tablet Take 1 tablet (81 mg total) by mouth daily. 90 tablet 3   carvedilol (COREG) 25 MG tablet TAKE 1 TABLET BY MOUTH 2 TIMES DAILY WITH A MEAL (NEEDS YEARLY APPT) 180 tablet 3   ciprofloxacin (CIPRO) 500 MG tablet Take 1 tablet by mouth 2 times a day. 20 tablet 0   fluticasone (FLONASE) 50 MCG/ACT nasal spray Place 1 spray into both nostrils daily as needed for allergies or rhinitis.     latanoprost (XALATAN) 0.005 % ophthalmic  solution INSTILL 1 DROP INTO BOTH EYES EVERY EVENING 2.5 mL 2   lisinopril (ZESTRIL) 40 MG tablet Take 1 tablet (40 mg total) by mouth daily. 90 tablet 3   loratadine (CLARITIN) 10 MG tablet Take 10 mg by mouth every morning.      metFORMIN (GLUCOPHAGE) 1000 MG tablet Take 1,000-1,500 mg by mouth See admin instructions. *TAKES '1000MG'$  IN THE MORNING AND '1500MG'$  IN THE EVENING*     metFORMIN (GLUCOPHAGE) 1000 MG tablet Take 1 tablet by mouth with breakfast, and 1 & 1/2 tablets by mouth with supper. 225 tablet 0   nitroGLYCERIN (NITROSTAT) 0.4 MG SL tablet Place 1 tablet (0.4 mg total) under the tongue every 5 (five) minutes x 3 doses as needed for chest pain. 25 tablet 5   pioglitazone (ACTOS) 15 MG tablet Take 1 tablet by mouth daily for blood sugar. 90 tablet 0   prednisoLONE acetate (PRED FORTE) 1 % ophthalmic suspension Instill 1 drop into affected eye 4 times a day for 5 days, then stop. 5 mL 0   rosuvastatin (CRESTOR) 10 MG tablet Take 1 tablet (10 mg total) by mouth daily. 30 tablet 0   spironolactone (ALDACTONE) 25 MG tablet TAKE 1 TABLET BY MOUTH ONCE A DAY (NEEDS YEARLY APPT) 90 tablet 3   sulfamethoxazole-trimethoprim (BACTRIM DS) 800-160 MG tablet Take 1 tablet by mouth 2 times a day 20 tablet 0   tamsulosin (FLOMAX) 0.4 MG CAPS capsule Take 1 capsule by mouth every night at bedtime. 90 capsule 3   Dulaglutide (TRULICITY) 3 UX/3.2TF SOPN Inject '3mg'$  subcutaneously every week. (Patient not taking: Reported on 07/29/2022) 6 mL 0   Dulaglutide (TRULICITY) 3 TD/3.2KG SOPN Inject '3mg'$  subcutaneously every week. (Patient not taking: Reported on 07/29/2022) 12 mL 1   liraglutide (VICTOZA) 18 MG/3ML SOPN Inject 0.'6mg'$  under the skin once daily x 1 week then 1.'2mg'$  daily x 1 week then 1.'8mg'$  once daily (Patient not taking: Reported on 07/29/2022) 3 mL 0   liraglutide (VICTOZA) 18 MG/3ML SOPN Inject 1.8 mg into the skin daily. (Patient not taking: Reported on 07/29/2022) 3 mL 0   No current facility-administered  medications for this visit.    No Known Allergies  Social History   Socioeconomic History   Marital status: Single    Spouse name: Not on file   Number of children: 2   Years of education: Not on file   Highest education level: Not on file  Occupational History   Occupation: Nurse, [employer]    Employer: OTHER    Employer: [employer]  Tobacco Use   Smoking status: Former    Packs/day: 0.50    Years: 30.00    Total pack years: 15.00    Types: Cigarettes   Smokeless tobacco: Never   Tobacco comments:    "  quit smoking cigarettes in ~ 2007"  Vaping Use   Vaping Use: Never used  Substance and Sexual Activity   Alcohol use: No   Drug use: No   Sexual activity: Yes  Other Topics Concern   Not on file  Social History Narrative   Not on file   Social Determinants of Health   Financial Resource Strain: Not on file  Food Insecurity: Not on file  Transportation Needs: Not on file  Physical Activity: Not on file  Stress: Not on file  Social Connections: Not on file  Intimate Partner Violence: Not on file    Family History  Problem Relation Age of Onset   Coronary artery disease Father 7   Hypertension Mother    Heart attack Paternal Grandfather        x2 (MI at age 68 was his cause of death)    Review of Systems:  As stated in the HPI and otherwise negative.   BP (!) 146/86   Pulse 83   Ht '5\' 10"'$  (1.778 m)   Wt 219 lb 3.2 oz (99.4 kg)   SpO2 99%   BMI 31.45 kg/m   Physical Examination: General: Well developed, well nourished, NAD  HEENT: OP clear, mucus membranes moist  SKIN: warm, dry. No rashes. Neuro: No focal deficits  Musculoskeletal: Muscle strength 5/5 all ext  Psychiatric: Mood and affect normal  Neck: No JVD, no carotid bruits, no thyromegaly, no lymphadenopathy.  Lungs:Clear bilaterally, no wheezes, rhonci, crackles Cardiovascular: Regular rate and rhythm. No murmurs, gallops or rubs. Abdomen:Soft. Bowel sounds present. Non-tender.   Extremities: No lower extremity edema. Pulses are 2 + in the bilateral DP/PT.  Echo January 2020:  - Left ventricle: The cavity size was moderately dilated. Wall    thickness was normal. Systolic function was severely reduced. The    estimated ejection fraction was in the range of 25% to 30%.    Diffuse hypokinesis. Doppler parameters are consistent with    abnormal left ventricular relaxation (grade 1 diastolic    dysfunction).  - Left atrium: The atrium was mildly dilated.  - Atrial septum: No defect or patent foramen ovale was identified.   EKG:  EKG is ordered today. The ekg ordered today demonstrates sinus, Inferior and lateral T wave abn, unchanged.   Recent Labs: No results found for requested labs within last 365 days.   Lipid Panel    Component Value Date/Time   CHOL 111 07/13/2021 0840   TRIG 148 07/13/2021 0840   HDL 32 (L) 07/13/2021 0840   CHOLHDL 3.5 07/13/2021 0840   CHOLHDL 4 02/10/2015 0834   VLDL 24.6 02/10/2015 0834   LDLCALC 53 07/13/2021 0840     Wt Readings from Last 3 Encounters:  07/29/22 219 lb 3.2 oz (99.4 kg)  08/03/21 213 lb 12.8 oz (97 kg)  06/19/21 213 lb 12.8 oz (97 kg)     Assessment and Plan:   1. CAD s/p CABG without angina: He has no chest pain. He is s/p CABG December 2015. Will continue ASA, statin and beta blocker.     2. HYPERTENSION: BP is well controlled at. No changes today  3. Ischemic cardiomyopathy: LVEF=20-25% s/p CABG December 2015 and unchanged by echo in 2020. He has been seen by EP but does not wish to have an ICD placed. Will continue beta blocker, Ace-inh and aldactone. He remains NYHA class 1.   4. Hyperlipidemia: Lipids followed in primary care.  LDL at goal in 2022. He has  lab work with Dr. Arelia Sneddon next week. Continue statin.    Labs/ tests ordered today include:   Orders Placed This Encounter  Procedures   EKG 12-Lead    Disposition:   F/U with me in 12 months  Signed, Lauree Chandler, MD 07/29/2022  11:48 AM    [facility] [address], [place], [place]  [address] Phone: [phone]; Fax: [phone]

## 2022-08-05 DIAGNOSIS — I10 Essential (primary) hypertension: Secondary | ICD-10-CM | POA: Diagnosis not present

## 2022-08-05 DIAGNOSIS — E785 Hyperlipidemia, unspecified: Secondary | ICD-10-CM | POA: Diagnosis not present

## 2022-08-05 DIAGNOSIS — E119 Type 2 diabetes mellitus without complications: Secondary | ICD-10-CM | POA: Diagnosis not present

## 2022-08-05 DIAGNOSIS — Z125 Encounter for screening for malignant neoplasm of prostate: Secondary | ICD-10-CM | POA: Diagnosis not present

## 2022-08-07 ENCOUNTER — Other Ambulatory Visit (HOSPITAL_COMMUNITY): Payer: Self-pay

## 2022-08-07 ENCOUNTER — Other Ambulatory Visit: Payer: Self-pay | Admitting: Cardiovascular Disease

## 2022-08-07 MED ORDER — ROSUVASTATIN CALCIUM 10 MG PO TABS
10.0000 mg | ORAL_TABLET | Freq: Every day | ORAL | 3 refills | Status: DC
  Filled 2022-08-07: qty 90, 90d supply, fill #0
  Filled 2022-11-09: qty 90, 90d supply, fill #1
  Filled 2023-02-11: qty 90, 90d supply, fill #2
  Filled 2023-05-09: qty 90, 90d supply, fill #3

## 2022-08-09 DIAGNOSIS — E119 Type 2 diabetes mellitus without complications: Secondary | ICD-10-CM | POA: Diagnosis not present

## 2022-08-09 DIAGNOSIS — R972 Elevated prostate specific antigen [PSA]: Secondary | ICD-10-CM | POA: Diagnosis not present

## 2022-08-09 DIAGNOSIS — E875 Hyperkalemia: Secondary | ICD-10-CM | POA: Diagnosis not present

## 2022-08-16 ENCOUNTER — Other Ambulatory Visit (HOSPITAL_COMMUNITY): Payer: Self-pay

## 2022-08-21 DIAGNOSIS — H401131 Primary open-angle glaucoma, bilateral, mild stage: Secondary | ICD-10-CM | POA: Diagnosis not present

## 2022-09-18 ENCOUNTER — Other Ambulatory Visit (HOSPITAL_COMMUNITY): Payer: Self-pay

## 2022-09-18 ENCOUNTER — Other Ambulatory Visit: Payer: Self-pay | Admitting: Cardiovascular Disease

## 2022-09-18 MED ORDER — SPIRONOLACTONE 25 MG PO TABS
25.0000 mg | ORAL_TABLET | Freq: Every day | ORAL | 3 refills | Status: DC
  Filled 2022-09-18: qty 90, 90d supply, fill #0
  Filled 2022-12-26: qty 90, 90d supply, fill #1
  Filled 2023-03-28 – 2023-04-02 (×2): qty 90, 90d supply, fill #2
  Filled 2023-06-28 (×3): qty 90, 90d supply, fill #3

## 2022-09-25 ENCOUNTER — Other Ambulatory Visit: Payer: Self-pay | Admitting: Cardiovascular Disease

## 2022-09-25 ENCOUNTER — Other Ambulatory Visit (HOSPITAL_COMMUNITY): Payer: Self-pay

## 2022-09-25 MED ORDER — CARVEDILOL 25 MG PO TABS
25.0000 mg | ORAL_TABLET | Freq: Two times a day (BID) | ORAL | 3 refills | Status: DC
Start: 2022-09-25 — End: 2023-09-05
  Filled 2022-09-25: qty 180, 90d supply, fill #0
  Filled 2022-12-26: qty 180, 90d supply, fill #1
  Filled 2023-03-28: qty 180, 90d supply, fill #2
  Filled 2023-06-28 (×3): qty 180, 90d supply, fill #3

## 2022-10-09 ENCOUNTER — Other Ambulatory Visit (HOSPITAL_COMMUNITY): Payer: Self-pay

## 2022-10-09 MED ORDER — PIOGLITAZONE HCL 15 MG PO TABS
15.0000 mg | ORAL_TABLET | Freq: Every day | ORAL | 1 refills | Status: DC
  Filled 2022-10-09: qty 90, 90d supply, fill #0
  Filled 2023-01-10: qty 90, 90d supply, fill #1

## 2022-10-11 ENCOUNTER — Other Ambulatory Visit (HOSPITAL_COMMUNITY): Payer: Self-pay

## 2022-10-14 ENCOUNTER — Other Ambulatory Visit (HOSPITAL_COMMUNITY): Payer: Self-pay

## 2022-10-14 MED ORDER — LATANOPROST 0.005 % OP SOLN
1.0000 [drp] | Freq: Every evening | OPHTHALMIC | 2 refills | Status: DC
  Filled 2022-10-14: qty 2.5, 25d supply, fill #0
  Filled 2022-12-12: qty 2.5, 25d supply, fill #1
  Filled 2023-01-28: qty 2.5, 25d supply, fill #2

## 2022-10-17 ENCOUNTER — Other Ambulatory Visit (HOSPITAL_COMMUNITY): Payer: Self-pay

## 2022-10-18 ENCOUNTER — Other Ambulatory Visit (HOSPITAL_COMMUNITY): Payer: Self-pay

## 2022-10-18 MED ORDER — METFORMIN HCL 1000 MG PO TABS
ORAL_TABLET | ORAL | 1 refills | Status: DC
  Filled 2022-10-18: qty 225, 90d supply, fill #0
  Filled 2023-01-17: qty 225, 90d supply, fill #1

## 2022-11-04 DIAGNOSIS — E119 Type 2 diabetes mellitus without complications: Secondary | ICD-10-CM | POA: Diagnosis not present

## 2022-11-09 ENCOUNTER — Other Ambulatory Visit (HOSPITAL_COMMUNITY): Payer: Self-pay

## 2022-11-12 ENCOUNTER — Other Ambulatory Visit (HOSPITAL_COMMUNITY): Payer: Self-pay

## 2022-12-13 ENCOUNTER — Other Ambulatory Visit: Payer: Self-pay

## 2022-12-26 ENCOUNTER — Other Ambulatory Visit (HOSPITAL_COMMUNITY): Payer: Self-pay

## 2023-01-10 ENCOUNTER — Other Ambulatory Visit (HOSPITAL_COMMUNITY): Payer: Self-pay

## 2023-01-17 ENCOUNTER — Other Ambulatory Visit (HOSPITAL_COMMUNITY): Payer: Self-pay

## 2023-01-25 ENCOUNTER — Other Ambulatory Visit: Payer: Self-pay | Admitting: Cardiovascular Disease

## 2023-01-27 ENCOUNTER — Other Ambulatory Visit (HOSPITAL_COMMUNITY): Payer: Self-pay

## 2023-01-27 MED ORDER — LISINOPRIL 40 MG PO TABS
40.0000 mg | ORAL_TABLET | Freq: Every day | ORAL | 2 refills | Status: DC
  Filled 2023-01-27: qty 90, 90d supply, fill #0
  Filled 2023-05-02: qty 90, 90d supply, fill #1
  Filled 2023-08-02: qty 90, 90d supply, fill #2

## 2023-01-28 ENCOUNTER — Other Ambulatory Visit (HOSPITAL_COMMUNITY): Payer: Self-pay

## 2023-01-28 ENCOUNTER — Other Ambulatory Visit: Payer: Self-pay

## 2023-01-29 ENCOUNTER — Other Ambulatory Visit (HOSPITAL_COMMUNITY): Payer: Self-pay

## 2023-02-11 ENCOUNTER — Other Ambulatory Visit (HOSPITAL_COMMUNITY): Payer: Self-pay

## 2023-02-24 DIAGNOSIS — H401131 Primary open-angle glaucoma, bilateral, mild stage: Secondary | ICD-10-CM | POA: Diagnosis not present

## 2023-03-14 ENCOUNTER — Other Ambulatory Visit (HOSPITAL_COMMUNITY): Payer: Self-pay

## 2023-03-14 MED ORDER — LATANOPROST 0.005 % OP SOLN
1.0000 [drp] | Freq: Every evening | OPHTHALMIC | 2 refills | Status: DC
  Filled 2023-03-14: qty 2.5, 25d supply, fill #0
  Filled 2023-05-05: qty 2.5, 25d supply, fill #1
  Filled 2023-06-28 (×3): qty 2.5, 25d supply, fill #2

## 2023-03-28 ENCOUNTER — Other Ambulatory Visit: Payer: Self-pay

## 2023-03-28 ENCOUNTER — Other Ambulatory Visit (HOSPITAL_COMMUNITY): Payer: Self-pay

## 2023-04-02 ENCOUNTER — Other Ambulatory Visit (HOSPITAL_COMMUNITY): Payer: Self-pay

## 2023-04-18 ENCOUNTER — Other Ambulatory Visit (HOSPITAL_COMMUNITY): Payer: Self-pay

## 2023-04-18 MED ORDER — METFORMIN HCL 1000 MG PO TABS
ORAL_TABLET | ORAL | 0 refills | Status: DC
  Filled 2023-04-18 (×2): qty 225, 90d supply, fill #0

## 2023-04-18 MED ORDER — PIOGLITAZONE HCL 15 MG PO TABS
15.0000 mg | ORAL_TABLET | Freq: Every day | ORAL | 0 refills | Status: DC
  Filled 2023-04-18 (×2): qty 90, 90d supply, fill #0

## 2023-04-19 ENCOUNTER — Other Ambulatory Visit (HOSPITAL_COMMUNITY): Payer: Self-pay

## 2023-04-22 DIAGNOSIS — E119 Type 2 diabetes mellitus without complications: Secondary | ICD-10-CM | POA: Diagnosis not present

## 2023-04-22 DIAGNOSIS — R972 Elevated prostate specific antigen [PSA]: Secondary | ICD-10-CM | POA: Diagnosis not present

## 2023-04-22 DIAGNOSIS — E785 Hyperlipidemia, unspecified: Secondary | ICD-10-CM | POA: Diagnosis not present

## 2023-04-22 DIAGNOSIS — I10 Essential (primary) hypertension: Secondary | ICD-10-CM | POA: Diagnosis not present

## 2023-05-02 ENCOUNTER — Other Ambulatory Visit (HOSPITAL_COMMUNITY): Payer: Self-pay

## 2023-05-05 ENCOUNTER — Other Ambulatory Visit (HOSPITAL_COMMUNITY): Payer: Self-pay

## 2023-05-05 DIAGNOSIS — E669 Obesity, unspecified: Secondary | ICD-10-CM | POA: Diagnosis not present

## 2023-05-05 DIAGNOSIS — R7301 Impaired fasting glucose: Secondary | ICD-10-CM | POA: Diagnosis not present

## 2023-05-05 DIAGNOSIS — E785 Hyperlipidemia, unspecified: Secondary | ICD-10-CM | POA: Diagnosis not present

## 2023-05-05 DIAGNOSIS — I10 Essential (primary) hypertension: Secondary | ICD-10-CM | POA: Diagnosis not present

## 2023-05-09 ENCOUNTER — Other Ambulatory Visit: Payer: Self-pay

## 2023-05-21 DIAGNOSIS — I10 Essential (primary) hypertension: Secondary | ICD-10-CM | POA: Diagnosis not present

## 2023-05-21 DIAGNOSIS — R7309 Other abnormal glucose: Secondary | ICD-10-CM | POA: Diagnosis not present

## 2023-05-21 DIAGNOSIS — E785 Hyperlipidemia, unspecified: Secondary | ICD-10-CM | POA: Diagnosis not present

## 2023-06-02 DIAGNOSIS — H401131 Primary open-angle glaucoma, bilateral, mild stage: Secondary | ICD-10-CM | POA: Diagnosis not present

## 2023-06-20 DIAGNOSIS — N401 Enlarged prostate with lower urinary tract symptoms: Secondary | ICD-10-CM | POA: Diagnosis not present

## 2023-06-20 DIAGNOSIS — R351 Nocturia: Secondary | ICD-10-CM | POA: Diagnosis not present

## 2023-06-28 ENCOUNTER — Other Ambulatory Visit (HOSPITAL_COMMUNITY): Payer: Self-pay

## 2023-06-30 ENCOUNTER — Other Ambulatory Visit (HOSPITAL_COMMUNITY): Payer: Self-pay

## 2023-06-30 ENCOUNTER — Other Ambulatory Visit: Payer: Self-pay

## 2023-07-02 ENCOUNTER — Other Ambulatory Visit (HOSPITAL_COMMUNITY): Payer: Self-pay

## 2023-07-18 ENCOUNTER — Other Ambulatory Visit (HOSPITAL_COMMUNITY): Payer: Self-pay

## 2023-07-18 MED ORDER — METFORMIN HCL 1000 MG PO TABS
ORAL_TABLET | ORAL | 1 refills | Status: DC
  Filled 2023-07-18: qty 225, 90d supply, fill #0
  Filled 2023-10-25: qty 225, 90d supply, fill #1

## 2023-07-18 MED ORDER — PIOGLITAZONE HCL 15 MG PO TABS
15.0000 mg | ORAL_TABLET | Freq: Every day | ORAL | 1 refills | Status: AC
  Filled 2023-07-18: qty 90, 90d supply, fill #0
  Filled 2023-10-25: qty 90, 90d supply, fill #1

## 2023-08-02 ENCOUNTER — Other Ambulatory Visit (HOSPITAL_COMMUNITY): Payer: Self-pay

## 2023-08-04 ENCOUNTER — Other Ambulatory Visit (HOSPITAL_COMMUNITY): Payer: Self-pay

## 2023-08-04 MED ORDER — TAMSULOSIN HCL 0.4 MG PO CAPS
0.4000 mg | ORAL_CAPSULE | Freq: Every day | ORAL | 3 refills | Status: DC
  Filled 2023-08-04: qty 90, 90d supply, fill #0
  Filled 2023-11-02: qty 90, 90d supply, fill #1
  Filled 2024-02-03: qty 90, 90d supply, fill #2
  Filled 2024-04-26: qty 90, 90d supply, fill #3

## 2023-08-08 ENCOUNTER — Other Ambulatory Visit (HOSPITAL_COMMUNITY): Payer: Self-pay

## 2023-08-08 ENCOUNTER — Other Ambulatory Visit: Payer: Self-pay

## 2023-08-08 MED ORDER — LEVOFLOXACIN 750 MG PO TABS
750.0000 mg | ORAL_TABLET | ORAL | 0 refills | Status: DC
  Filled 2023-08-08: qty 1, 1d supply, fill #0

## 2023-08-09 ENCOUNTER — Other Ambulatory Visit: Payer: Self-pay | Admitting: Cardiovascular Disease

## 2023-08-11 ENCOUNTER — Other Ambulatory Visit (HOSPITAL_COMMUNITY): Payer: Self-pay

## 2023-08-11 ENCOUNTER — Other Ambulatory Visit: Payer: Self-pay

## 2023-08-11 MED ORDER — ROSUVASTATIN CALCIUM 10 MG PO TABS
10.0000 mg | ORAL_TABLET | Freq: Every day | ORAL | 0 refills | Status: DC
  Filled 2023-08-11: qty 30, 30d supply, fill #0

## 2023-08-12 ENCOUNTER — Other Ambulatory Visit: Payer: Self-pay

## 2023-08-13 ENCOUNTER — Other Ambulatory Visit (HOSPITAL_COMMUNITY): Payer: Self-pay

## 2023-08-18 DIAGNOSIS — R972 Elevated prostate specific antigen [PSA]: Secondary | ICD-10-CM | POA: Diagnosis not present

## 2023-08-18 DIAGNOSIS — C61 Malignant neoplasm of prostate: Secondary | ICD-10-CM | POA: Diagnosis not present

## 2023-08-21 ENCOUNTER — Other Ambulatory Visit (HOSPITAL_COMMUNITY): Payer: Self-pay

## 2023-08-21 MED ORDER — LATANOPROST 0.005 % OP SOLN
1.0000 [drp] | Freq: Every evening | OPHTHALMIC | 1 refills | Status: DC
  Filled 2023-08-21 – 2023-08-28 (×2): qty 2.5, 25d supply, fill #0
  Filled 2023-11-02: qty 2.5, 25d supply, fill #1

## 2023-08-22 ENCOUNTER — Other Ambulatory Visit (HOSPITAL_COMMUNITY): Payer: Self-pay

## 2023-08-28 ENCOUNTER — Other Ambulatory Visit (HOSPITAL_COMMUNITY): Payer: Self-pay

## 2023-08-28 ENCOUNTER — Other Ambulatory Visit: Payer: Self-pay

## 2023-09-01 DIAGNOSIS — C61 Malignant neoplasm of prostate: Secondary | ICD-10-CM | POA: Diagnosis not present

## 2023-09-04 ENCOUNTER — Other Ambulatory Visit (HOSPITAL_COMMUNITY): Payer: Self-pay

## 2023-09-04 DIAGNOSIS — H401131 Primary open-angle glaucoma, bilateral, mild stage: Secondary | ICD-10-CM | POA: Diagnosis not present

## 2023-09-04 NOTE — Progress Notes (Addendum)
Cardiology Office Note:    Date:  09/05/2023  ID:  Fay Records, DOB 1951/06/23, MRN [id number] PCP: Kaleen Mask, MD  [facility] Providers Cardiologist:  Verne Carrow, MD       Patient Profile:      Coronary artery disease  S/p MI in 2005 s/p DES to LCx and unsuccessful PCI of CTO of LAD  s/p CABG in 12/15 (HFrEF) heart failure with reduced ejection fraction TTE 09/2016: EF 25-30 Echo 12/2018: EF 25-30, GR 1 DD, mild LAE Ischemic CM EP eval in past >> not ICD candidate w NYHA I symptoms; works near high [employer]) Repeat EP eval after retirement - pt declined ICD Hypertension  Hyperlipidemia  Diabetes mellitus          History of Present Illness:  Discussed the use of AI scribe software for clinical note transcription with the patient, who gave verbal consent to proceed.    Zachary Ellis is a 72 y.o. male who returns for follow up of CAD, CHF. He was last seen by Dr. Clifton James in 07/2022.  He is here alone.  He has not had chest pain or shortness of breath, orthopnea, peripheral edema, syncope.  He notes a recent diagnosis of prostate cancer.  He is not certain if he will need surgery.     ROS:  See HPI No claudication    Studies Reviewed:   EKG Interpretation Date/Time:  Friday September 05 2023 09:57:49 EDT Ventricular Rate:  74 PR Interval:  178 QRS Duration:  116 QT Interval:  410 QTC Calculation: 455 R Axis:   -46  Text Interpretation: Normal sinus rhythm Left anterior fascicular block Minimal voltage criteria for LVH, may be normal variant ( R in aVL ) Anterolateral infarct , age undetermined Lateral TW inversions Confirmed by Tereso Newcomer [phone]) on 09/05/2023 10:13:13 AM    Risk Assessment/Calculations:             Physical Exam:   VS:  BP 130/70   Pulse 74   Ht 5\' 11"  (1.803 m)   Wt 224 lb 6.4 oz (101.8 kg)   SpO2 96%   BMI 31.30 kg/m    Wt Readings from Last 3 Encounters:  09/05/23 224 lb 6.4 oz (101.8 kg)   07/29/22 219 lb 3.2 oz (99.4 kg)  08/03/21 213 lb 12.8 oz (97 kg)    Constitutional:      Appearance: Healthy appearance. Not in distress.  Neck:     Vascular: No carotid bruit. JVD normal.  Pulmonary:     Breath sounds: Normal breath sounds. No wheezing. No rales.  Cardiovascular:     Normal rate. Regular rhythm.     Murmurs: There is no murmur.  Edema:    Peripheral edema absent.  Abdominal:     Palpations: Abdomen is soft.        Assessment and Plan:     Coronary Artery Disease S/p myocardial infarction in 2005, treated with drug-eluting stent to LCx and unsuccessful PCI of the LAD, which was chronically occluded.  He subsequently underwent CABG in 2015. Currently asymptomatic without chest pain to suggest angina. -Continue Aspirin 81mg  daily, Crestor 10mg  daily, and Nitroglycerin PRN for chest pain. -Follow-up 1 year  Heart Failure with Reduced Ejection Fraction Ischemic cardiomyopathy with EF 25-30%, NYHA class II. Volume status stable.  He has not been able to take SGLT2 inhibitor due to cost. -Continue Carvedilol 25mg  twice daily, Lisinopril 40mg  daily, Spironolactone 25mg  daily. -CMET today  Hyperlipidemia Recent LDL above goal (63 in May 4098). -Obtain repeat fasting CMET and lipids today.  -If LDL still above 55, increase Crestor to 20mg  daily.  Hypertension Controlled. -Continue Carvedilol 25mg  twice daily, Lisinopril 40mg  daily, Spironolactone 25mg  daily.  Prostate Cancer He recently had prostate biopsy detecting cancer.  He has follow-up with urology.  No immediate plans for surgery.  He does not have any unstable cardiac conditions.  He is able to achieve more than 4 METS without symptoms. -If surgery is planned within the next 3 months, the patient may proceed at acceptable CV risk without further testing.  Aspirin can be held for 7 days prior to surgery if bleeding risk is too great and resume postop safe.          Dispo:  Return in about 1 year (around  09/04/2024) for Routine Follow Up with Dr. Clifton James.  Signed, Tereso Newcomer, PA-C

## 2023-09-05 ENCOUNTER — Ambulatory Visit: Payer: Medicare Other | Attending: Physician Assistant | Admitting: Physician Assistant

## 2023-09-05 ENCOUNTER — Other Ambulatory Visit (HOSPITAL_COMMUNITY): Payer: Self-pay

## 2023-09-05 ENCOUNTER — Other Ambulatory Visit: Payer: Self-pay

## 2023-09-05 ENCOUNTER — Encounter: Payer: Self-pay | Admitting: Physician Assistant

## 2023-09-05 VITALS — BP 130/70 | HR 74 | Ht 71.0 in | Wt 224.4 lb

## 2023-09-05 DIAGNOSIS — C61 Malignant neoplasm of prostate: Secondary | ICD-10-CM | POA: Insufficient documentation

## 2023-09-05 DIAGNOSIS — I5020 Unspecified systolic (congestive) heart failure: Secondary | ICD-10-CM

## 2023-09-05 DIAGNOSIS — Z9861 Coronary angioplasty status: Secondary | ICD-10-CM | POA: Diagnosis not present

## 2023-09-05 DIAGNOSIS — I2510 Atherosclerotic heart disease of native coronary artery without angina pectoris: Secondary | ICD-10-CM

## 2023-09-05 DIAGNOSIS — I10 Essential (primary) hypertension: Secondary | ICD-10-CM | POA: Diagnosis not present

## 2023-09-05 DIAGNOSIS — E782 Mixed hyperlipidemia: Secondary | ICD-10-CM

## 2023-09-05 MED ORDER — SPIRONOLACTONE 25 MG PO TABS
25.0000 mg | ORAL_TABLET | Freq: Every day | ORAL | 3 refills | Status: DC
  Filled 2023-09-05 – 2023-10-04 (×2): qty 90, 90d supply, fill #0
  Filled 2024-01-12: qty 90, 90d supply, fill #1
  Filled 2024-04-14: qty 90, 90d supply, fill #2
  Filled 2024-07-20 – 2024-07-21 (×2): qty 90, 90d supply, fill #3

## 2023-09-05 MED ORDER — ROSUVASTATIN CALCIUM 10 MG PO TABS
10.0000 mg | ORAL_TABLET | Freq: Every day | ORAL | 0 refills | Status: DC
  Filled 2023-09-05: qty 30, 30d supply, fill #0

## 2023-09-05 MED ORDER — NITROGLYCERIN 0.4 MG SL SUBL
0.4000 mg | SUBLINGUAL_TABLET | SUBLINGUAL | 5 refills | Status: AC | PRN
  Filled 2023-09-05: qty 25, 8d supply, fill #0

## 2023-09-05 MED ORDER — LISINOPRIL 40 MG PO TABS
40.0000 mg | ORAL_TABLET | Freq: Every day | ORAL | 2 refills | Status: DC
  Filled 2023-09-05 – 2023-11-15 (×2): qty 90, 90d supply, fill #0
  Filled 2024-02-17: qty 90, 90d supply, fill #1
  Filled 2024-05-18: qty 90, 90d supply, fill #2

## 2023-09-05 MED ORDER — CARVEDILOL 25 MG PO TABS
25.0000 mg | ORAL_TABLET | Freq: Two times a day (BID) | ORAL | 3 refills | Status: DC
  Filled 2023-09-05 – 2023-10-04 (×2): qty 180, 90d supply, fill #0
  Filled 2024-01-05: qty 180, 90d supply, fill #1
  Filled 2024-04-05: qty 180, 90d supply, fill #2
  Filled 2024-07-07: qty 180, 90d supply, fill #3

## 2023-09-05 NOTE — Patient Instructions (Signed)
Medication Instructions:  Your physician recommends that you continue on your current medications as directed. Please refer to the Current Medication list given to you today.  *If you need a refill on your cardiac medications before your next appointment, please call your pharmacy*   Lab Work: TODAY:  CMET & LIPID  If you have labs (blood work) drawn today and your tests are completely normal, you will receive your results only by: MyChart Message (if you have MyChart) OR A paper copy in the mail If you have any lab test that is abnormal or we need to change your treatment, we will call you to review the results.   Testing/Procedures: None ordered   Follow-Up: At [facility], you and your health needs are our priority.  As part of our continuing mission to provide you with exceptional heart care, we have created designated Provider Care Teams.  These Care Teams include your primary Cardiologist (physician) and Advanced Practice Providers (APPs -  Physician Assistants and Nurse Practitioners) who all work together to provide you with the care you need, when you need it.  We recommend signing up for the patient portal called "MyChart".  Sign up information is provided on this After Visit Summary.  MyChart is used to connect with patients for Virtual Visits (Telemedicine).  Patients are able to view lab/test results, encounter notes, upcoming appointments, etc.  Non-urgent messages can be sent to your provider as well.   To learn more about what you can do with MyChart, go to [email or web address].    Your next appointment:   1 year(s)  Provider:   Verne Carrow, MD     Other Instructions

## 2023-09-06 ENCOUNTER — Other Ambulatory Visit: Payer: Self-pay | Admitting: Physician Assistant

## 2023-09-06 LAB — COMPREHENSIVE METABOLIC PANEL
ALT: 14 IU/L (ref 0–44)
AST: 14 IU/L (ref 0–40)
Albumin: 4.5 g/dL (ref 3.9–4.9)
Alkaline Phosphatase: 65 IU/L (ref 44–121)
BUN/Creatinine Ratio: 17 (ref 10–24)
BUN: 19 mg/dL (ref 8–27)
Bilirubin Total: 0.7 mg/dL (ref 0.0–1.2)
CO2: 22 mmol/L (ref 20–29)
Calcium: 9.1 mg/dL (ref 8.6–10.2)
Chloride: 101 mmol/L (ref 96–106)
Creatinine, Ser: 1.14 mg/dL (ref 0.76–1.27)
Globulin, Total: 2.4 g/dL (ref 1.5–4.5)
Glucose: 139 mg/dL — ABNORMAL HIGH (ref 70–99)
Potassium: 4.6 mmol/L (ref 3.5–5.2)
Sodium: 138 mmol/L (ref 134–144)
Total Protein: 6.9 g/dL (ref 6.0–8.5)
eGFR: 70 mL/min/{1.73_m2} (ref >59)

## 2023-09-06 LAB — LIPID PANEL
Chol/HDL Ratio: 3.6 ratio (ref 0.0–5.0)
Cholesterol, Total: 107 mg/dL (ref 100–199)
HDL: 30 mg/dL — ABNORMAL LOW (ref >39)
LDL Chol Calc (NIH): 54 mg/dL (ref 0–99)
Triglycerides: 131 mg/dL (ref 0–149)
VLDL Cholesterol Cal: 23 mg/dL (ref 5–40)

## 2023-09-08 ENCOUNTER — Other Ambulatory Visit (HOSPITAL_COMMUNITY): Payer: Self-pay

## 2023-09-08 MED ORDER — ROSUVASTATIN CALCIUM 10 MG PO TABS
10.0000 mg | ORAL_TABLET | Freq: Every day | ORAL | 3 refills | Status: DC
  Filled 2023-09-08 – 2023-10-04 (×2): qty 90, 90d supply, fill #0
  Filled 2024-01-05: qty 90, 90d supply, fill #1
  Filled 2024-04-05: qty 90, 90d supply, fill #2
  Filled 2024-07-07: qty 90, 90d supply, fill #3

## 2023-09-08 NOTE — Progress Notes (Signed)
Pt has been made aware of normal result and verbalized understanding.  jw

## 2023-10-04 ENCOUNTER — Other Ambulatory Visit (HOSPITAL_COMMUNITY): Payer: Self-pay

## 2023-10-06 ENCOUNTER — Other Ambulatory Visit (HOSPITAL_COMMUNITY): Payer: Self-pay

## 2023-10-21 DIAGNOSIS — C61 Malignant neoplasm of prostate: Secondary | ICD-10-CM | POA: Diagnosis not present

## 2023-10-25 ENCOUNTER — Other Ambulatory Visit (HOSPITAL_COMMUNITY): Payer: Self-pay

## 2023-10-30 ENCOUNTER — Other Ambulatory Visit (HOSPITAL_COMMUNITY): Payer: Self-pay

## 2023-11-03 ENCOUNTER — Other Ambulatory Visit (HOSPITAL_COMMUNITY): Payer: Self-pay

## 2023-11-03 ENCOUNTER — Other Ambulatory Visit: Payer: Self-pay

## 2023-11-16 ENCOUNTER — Other Ambulatory Visit: Payer: Self-pay

## 2023-12-01 ENCOUNTER — Other Ambulatory Visit (HOSPITAL_COMMUNITY): Payer: Self-pay

## 2023-12-01 DIAGNOSIS — E119 Type 2 diabetes mellitus without complications: Secondary | ICD-10-CM | POA: Diagnosis not present

## 2023-12-01 MED ORDER — LATANOPROST 0.005 % OP SOLN
1.0000 [drp] | Freq: Every day | OPHTHALMIC | 3 refills | Status: DC
  Filled 2023-12-01: qty 2.5, 25d supply, fill #0
  Filled 2024-02-17: qty 2.5, 25d supply, fill #1
  Filled 2024-04-05: qty 2.5, 25d supply, fill #2
  Filled 2024-06-06: qty 2.5, 25d supply, fill #3

## 2023-12-02 ENCOUNTER — Other Ambulatory Visit: Payer: Self-pay

## 2024-01-05 ENCOUNTER — Other Ambulatory Visit (HOSPITAL_COMMUNITY): Payer: Self-pay

## 2024-01-12 ENCOUNTER — Other Ambulatory Visit: Payer: Self-pay

## 2024-01-20 ENCOUNTER — Other Ambulatory Visit (HOSPITAL_COMMUNITY): Payer: Self-pay

## 2024-01-27 ENCOUNTER — Other Ambulatory Visit (HOSPITAL_COMMUNITY): Payer: Self-pay

## 2024-01-30 DIAGNOSIS — K760 Fatty (change of) liver, not elsewhere classified: Secondary | ICD-10-CM | POA: Diagnosis not present

## 2024-01-30 DIAGNOSIS — E119 Type 2 diabetes mellitus without complications: Secondary | ICD-10-CM | POA: Diagnosis not present

## 2024-01-30 DIAGNOSIS — C61 Malignant neoplasm of prostate: Secondary | ICD-10-CM | POA: Diagnosis not present

## 2024-01-30 DIAGNOSIS — Z0000 Encounter for general adult medical examination without abnormal findings: Secondary | ICD-10-CM | POA: Diagnosis not present

## 2024-02-02 ENCOUNTER — Other Ambulatory Visit (HOSPITAL_COMMUNITY): Payer: Self-pay

## 2024-02-03 ENCOUNTER — Other Ambulatory Visit (HOSPITAL_COMMUNITY): Payer: Self-pay

## 2024-02-03 ENCOUNTER — Other Ambulatory Visit: Payer: Self-pay

## 2024-02-03 MED ORDER — PIOGLITAZONE HCL 15 MG PO TABS
15.0000 mg | ORAL_TABLET | Freq: Every day | ORAL | 1 refills | Status: DC
  Filled 2024-02-03 – 2024-02-04 (×3): qty 90, 90d supply, fill #0
  Filled 2024-04-26: qty 90, 90d supply, fill #1

## 2024-02-03 MED ORDER — METFORMIN HCL 1000 MG PO TABS
1000.0000 mg | ORAL_TABLET | Freq: Every day | ORAL | 1 refills | Status: DC
  Filled 2024-02-03 – 2024-02-04 (×2): qty 225, 90d supply, fill #0
  Filled 2024-05-18: qty 225, 90d supply, fill #1

## 2024-02-04 ENCOUNTER — Other Ambulatory Visit: Payer: Self-pay

## 2024-02-04 ENCOUNTER — Other Ambulatory Visit (HOSPITAL_COMMUNITY): Payer: Self-pay

## 2024-02-17 ENCOUNTER — Other Ambulatory Visit (HOSPITAL_BASED_OUTPATIENT_CLINIC_OR_DEPARTMENT_OTHER): Payer: Self-pay

## 2024-03-02 ENCOUNTER — Other Ambulatory Visit (HOSPITAL_COMMUNITY): Payer: Self-pay

## 2024-03-03 DIAGNOSIS — D23111 Other benign neoplasm of skin of right upper eyelid, including canthus: Secondary | ICD-10-CM | POA: Diagnosis not present

## 2024-04-05 ENCOUNTER — Other Ambulatory Visit (HOSPITAL_COMMUNITY): Payer: Self-pay

## 2024-04-06 ENCOUNTER — Other Ambulatory Visit: Payer: Self-pay

## 2024-04-14 ENCOUNTER — Other Ambulatory Visit (HOSPITAL_COMMUNITY): Payer: Self-pay

## 2024-04-20 DIAGNOSIS — C61 Malignant neoplasm of prostate: Secondary | ICD-10-CM | POA: Diagnosis not present

## 2024-04-26 DIAGNOSIS — R972 Elevated prostate specific antigen [PSA]: Secondary | ICD-10-CM | POA: Diagnosis not present

## 2024-04-26 DIAGNOSIS — C61 Malignant neoplasm of prostate: Secondary | ICD-10-CM | POA: Diagnosis not present

## 2024-04-27 ENCOUNTER — Other Ambulatory Visit (HOSPITAL_COMMUNITY): Payer: Self-pay

## 2024-04-27 ENCOUNTER — Other Ambulatory Visit: Payer: Self-pay | Admitting: Urology

## 2024-04-27 DIAGNOSIS — C61 Malignant neoplasm of prostate: Secondary | ICD-10-CM

## 2024-04-28 ENCOUNTER — Encounter: Payer: Self-pay | Admitting: Urology

## 2024-05-18 ENCOUNTER — Other Ambulatory Visit (HOSPITAL_COMMUNITY): Payer: Self-pay

## 2024-05-18 DIAGNOSIS — H0279 Other degenerative disorders of eyelid and periocular area: Secondary | ICD-10-CM | POA: Diagnosis not present

## 2024-05-18 DIAGNOSIS — D485 Neoplasm of uncertain behavior of skin: Secondary | ICD-10-CM | POA: Diagnosis not present

## 2024-06-02 DIAGNOSIS — H401131 Primary open-angle glaucoma, bilateral, mild stage: Secondary | ICD-10-CM | POA: Diagnosis not present

## 2024-06-07 ENCOUNTER — Ambulatory Visit
Admission: RE | Admit: 2024-06-07 | Discharge: 2024-06-07 | Disposition: A | Discharge status: Home / Self Care | Source: Ambulatory Visit | Attending: Urology | Admitting: Urology

## 2024-06-07 ENCOUNTER — Other Ambulatory Visit (HOSPITAL_COMMUNITY): Payer: Self-pay

## 2024-06-07 DIAGNOSIS — C61 Malignant neoplasm of prostate: Secondary | ICD-10-CM

## 2024-06-07 DIAGNOSIS — N400 Enlarged prostate without lower urinary tract symptoms: Secondary | ICD-10-CM | POA: Diagnosis not present

## 2024-06-07 DIAGNOSIS — K5730 Diverticulosis of large intestine without perforation or abscess without bleeding: Secondary | ICD-10-CM | POA: Diagnosis not present

## 2024-06-07 MED ORDER — GADOPICLENOL 0.5 MMOL/ML IV SOLN: 10.0000 mL | Freq: Once | INTRAVENOUS | Status: AC | PRN

## 2024-06-15 ENCOUNTER — Other Ambulatory Visit: Payer: Self-pay

## 2024-06-15 ENCOUNTER — Other Ambulatory Visit (HOSPITAL_COMMUNITY): Payer: Self-pay

## 2024-06-15 ENCOUNTER — Telehealth: Payer: Self-pay

## 2024-06-15 ENCOUNTER — Telehealth: Payer: Self-pay | Admitting: *Deleted

## 2024-06-15 MED ORDER — LEVOFLOXACIN 750 MG PO TABS
750.0000 mg | ORAL_TABLET | Freq: Every morning | ORAL | 0 refills | Status: AC
  Filled 2024-06-15 – 2024-06-28 (×2): qty 3, 3d supply, fill #0

## 2024-06-15 NOTE — Telephone Encounter (Signed)
   Name: [name]  DOB: 01-20-1951  MRN: [id number]  Primary Cardiologist: Lonni Cash, MD   Preoperative team, please contact this patient and set up a phone call appointment for further preoperative risk assessment. Please obtain consent and complete medication review. Thank you for your help.  I confirm that guidance regarding antiplatelet and oral anticoagulation therapy has been completed and, if necessary, noted below.  His aspirin  may be held for 5 to 7 days prior to his procedure.  Please resume as soon as hemostasis is achieved.  I also confirmed the patient resides in the state of [place] . As per [facility] Medical Board telemedicine laws, the patient must reside in the state in which the provider is licensed.   [name], NP 06/15/2024, 9:13 AM [facility]

## 2024-06-15 NOTE — Telephone Encounter (Signed)
 Pt has been scheduled tele preop appt 06/23/24. Med rec and consent are done.

## 2024-06-15 NOTE — Telephone Encounter (Signed)
   Pre-operative Risk Assessment    Patient Name: Zachary Ellis  DOB: 06-Nov-1951 MRN: [id number]   Date of last office visit: 09/05/23 GLENDIA FERRIER, PA-C Date of next office visit: NONE   Request for Surgical Clearance    Procedure:  PROSTATE BIOPSY  Date of Surgery:  Clearance 07/08/24                                Surgeon:  DR LOVIE Surgeon's Group or Practice Name:  [facility] Phone number:  [phone] Fax number:  [phone]   Type of Clearance Requested:   - Medical  - Pharmacy:  Hold Aspirin  5 DAYS BEFORE PROCEDURE   Type of Anesthesia:  Not Indicated   Additional requests/questions:    Signed, Lucie DELENA Ku   06/15/2024, 8:55 AM

## 2024-06-15 NOTE — Telephone Encounter (Signed)
 Pt has been scheduled tele preop appt 06/23/24. Med rec and consent are done.     Patient Consent for Virtual Visit        Zachary Ellis has provided verbal consent on 06/15/2024 for a virtual visit (video or telephone).   CONSENT FOR VIRTUAL VISIT FOR:  Zachary Ellis  By participating in this virtual visit I agree to the following:  I hereby voluntarily request, consent and authorize [facility] and its employed or contracted physicians, physician assistants, nurse practitioners or other licensed health care professionals (the Practitioner), to provide me with telemedicine health care services (the "Services) as deemed necessary by the treating Practitioner. I acknowledge and consent to receive the Services by the Practitioner via telemedicine. I understand that the telemedicine visit will involve communicating with the Practitioner through live audiovisual communication technology and the disclosure of certain medical information by electronic transmission. I acknowledge that I have been given the opportunity to request an in-person assessment or other available alternative prior to the telemedicine visit and am voluntarily participating in the telemedicine visit.  I understand that I have the right to withhold or withdraw my consent to the use of telemedicine in the course of my care at any time, without affecting my right to future care or treatment, and that the Practitioner or I may terminate the telemedicine visit at any time. I understand that I have the right to inspect all information obtained and/or recorded in the course of the telemedicine visit and may receive copies of available information for a reasonable fee.  I understand that some of the potential risks of receiving the Services via telemedicine include:  Delay or interruption in medical evaluation due to technological equipment failure or disruption; Information transmitted may not be sufficient (e.g. poor resolution  of images) to allow for appropriate medical decision making by the Practitioner; and/or  In rare instances, security protocols could fail, causing a breach of personal health information.  Furthermore, I acknowledge that it is my responsibility to provide information about my medical history, conditions and care that is complete and accurate to the best of my ability. I acknowledge that Practitioner's advice, recommendations, and/or decision may be based on factors not within their control, such as incomplete or inaccurate data provided by me or distortions of diagnostic images or specimens that may result from electronic transmissions. I understand that the practice of medicine is not an exact science and that Practitioner makes no warranties or guarantees regarding treatment outcomes. I acknowledge that a copy of this consent can be made available to me via my patient portal [facility] MyChart), or I can request a printed copy by calling the office of [facility].    I understand that my insurance will be billed for this visit.   I have read or had this consent read to me. I understand the contents of this consent, which adequately explains the benefits and risks of the Services being provided via telemedicine.  I have been provided ample opportunity to ask questions regarding this consent and the Services and have had my questions answered to my satisfaction. I give my informed consent for the services to be provided through the use of telemedicine in my medical care

## 2024-06-16 ENCOUNTER — Other Ambulatory Visit: Payer: Self-pay

## 2024-06-18 ENCOUNTER — Other Ambulatory Visit: Payer: Self-pay

## 2024-06-23 ENCOUNTER — Ambulatory Visit: Attending: Cardiology | Admitting: Emergency Medicine

## 2024-06-23 ENCOUNTER — Telehealth: Payer: Self-pay

## 2024-06-23 DIAGNOSIS — Z01810 Encounter for preprocedural cardiovascular examination: Secondary | ICD-10-CM | POA: Diagnosis not present

## 2024-06-23 NOTE — Progress Notes (Signed)
 Virtual Visit via Telephone Note   Because of Zachary Ellis co-morbid illnesses, he is at least at moderate risk for complications without adequate follow up.  This format is felt to be most appropriate for this patient at this time.  Due to technical limitations with video connection (technology), today's appointment will be conducted as an audio only telehealth visit, and Zachary Ellis verbally agreed to proceed in this manner.   All issues noted in this document were discussed and addressed.  No physical exam could be performed with this format.  Evaluation Performed:  Preoperative cardiovascular risk assessment _____________   Date:  06/23/2024   Patient ID:  Zachary Ellis, DOB [birth year], MRN [id number] Patient Location:  Home Provider location:   Office  Primary Care Provider:  Loring Tanda Mae, MD Primary Cardiologist:  Lonni Cash, MD  Chief Complaint / Patient Profile   73 y.o. y/o male with a h/o coronary artery disease, HFrEF, ischemic cardiomyopathy, hypertension, hyperlipidemia, type 2 diabetes who is pending prostate biopsy on 07/08/2024 with [facility] and right upper eyelid lesion excision and repair with biopsy on 07/26/2024 with [facility] and presents today for telephonic preoperative cardiovascular risk assessment.  History of Present Illness    Zachary Ellis is a 73 y.o. male who presents via audio/video conferencing for a telehealth visit today.  Pt was last seen in cardiology clinic on 09/05/2023 by Glendia Ferrier, PA.  At that time Zachary Ellis was doing well.  The patient is now pending procedure as outlined above. Since his last visit, he denies chest pain, shortness of breath, lower extremity edema, fatigue, palpitations, melena, hematuria, hemoptysis, diaphoresis, weakness, presyncope, syncope, orthopnea, and PND.  Today patient is doing well overall.  He is without acute cardiovascular concerns or complaint at this time.  Denies any  anginal symptoms.  Remains relatively active without exertional chest pains or dyspnea.  Reports he does play golf once a week without limitation.  He also mows and weed eats his yard without symptoms.  Overall he is able to complete greater than 4 METS.  Past Medical History    Past Medical History:  Diagnosis Date   Chronic systolic heart failure (HCC) 09/08/2015   2/2 Ischemic CM // Echo 09/2016 - EF 25-30, inferolateral/inferior/inferoseptal akinesis, grade 1 diastolic dysfunction, mildly dilated ascending aorta (41 mm), moderate LAE, mild RVE, trivial TR // Eval by EP (Dr. 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   LIMA to LAD, SVG to Diag, SVG to OM, SVG to RCA w/ EVH via right thigh and leg   Type II diabetes mellitus (HCC)    Past Surgical History:  Procedure Laterality Date   CARDIAC CATHETERIZATION  Dec 12, 2014   recommending about [facility]   COLONOSCOPY WITH PROPOFOL  N/A 03/29/2016   Procedure: COLONOSCOPY WITH PROPOFOL ;  Surgeon: Victory LITTIE Legrand DOUGLAS, MD;  Location: [facility];  Service: Gastroenterology;  Laterality: N/A;  CORONARY ANGIOPLASTY  01/2004   failed LAD PCI   CORONARY ANGIOPLASTY WITH STENT PLACEMENT  12/2003   CFX   CORONARY ARTERY BYPASS GRAFT N/A 12/09/2014   Procedure: CORONARY ARTERY BYPASS GRAFTING (CABG), ON PUMP, TIMES FOUR, USING LEFT INTERNAL MAMMARY ARTERY, RIGHT GREATER SAPHENOUS VEIN HARVESTED ENDOSCOPICALLY;  Surgeon: Sudie VEAR Laine, MD;  Location: [facility] OR;  Service: Open Heart Surgery;  Laterality: N/A;   INTRAOPERATIVE TRANSESOPHAGEAL  ECHOCARDIOGRAM N/A 12/09/2014   Procedure: INTRAOPERATIVE TRANSESOPHAGEAL ECHOCARDIOGRAM;  Surgeon: Sudie VEAR Laine, MD;  Location: [facility] OR;  Service: Open Heart Surgery;  Laterality: N/A;   LEFT HEART CATHETERIZATION WITH CORONARY ANGIOGRAM N/A 12/07/2014   Procedure: LEFT HEART CATHETERIZATION WITH CORONARY ANGIOGRAM;  Surgeon: Lonni JONETTA Cash, MD;  Location: [facility] CATH LAB;  Service: Cardiovascular;  Laterality: N/A;   WISDOM TOOTH EXTRACTION  1980's   'all 4    Allergies  No Known Allergies  Home Medications    Prior to Admission medications   Medication Sig Start Date End Date Taking? Authorizing Provider  aspirin  EC 81 MG tablet Take 1 tablet (81 mg total) by mouth daily. 12/05/17   Cash Lonni JONETTA, MD  carvedilol  (COREG ) 25 MG tablet Take 1 tablet (25 mg total) by mouth 2 (two) times daily with a meal. 09/05/23 09/04/24  Lelon Hamilton T, PA-C  fluticasone  (FLONASE ) 50 MCG/ACT nasal spray Place 1 spray into both nostrils daily as needed for allergies or rhinitis.    [provider]  latanoprost  (XALATAN ) 0.005 % ophthalmic solution Place 1 drop into both eyes at bedtime. 12/01/23     levofloxacin  (LEVAQUIN ) 750 MG tablet Take 1 tablet (750 mg total) by mouth every morning begin taking the day before your procedure. 06/14/24     lisinopril  (ZESTRIL ) 40 MG tablet Take 1 tablet (40 mg total) by mouth daily. 09/05/23   Lelon Hamilton T, PA-C  loratadine  (CLARITIN ) 10 MG tablet Take 10 mg by mouth every morning.     [provider]  metFORMIN  (GLUCOPHAGE ) 1000 MG tablet Take 1,000-1,500 mg by mouth See admin instructions. *TAKES 1000MG  IN THE MORNING AND 1500MG  IN THE EVENING* Patient taking differently: Take 1,000-1,500 mg by mouth See admin instructions. *TAKES 1000MG  IN THE MORNING AND 1000MG  IN THE EVENING* 06/21/24 PER PT    [provider]  metFORMIN  (GLUCOPHAGE ) 1000 MG tablet Take 1 tablet by mouth daily with breakfast AND 1 and 1/2 tablets daily with  supper. Patient not taking: Reported on 09/05/2023 07/18/23     metFORMIN  (GLUCOPHAGE ) 1000 MG tablet Take 1 tablet (1,000 mg total) by mouth daily with breakfast and 1 1/2 tablets (1,500mg ) with supper. 02/03/24     nitroGLYCERIN  (NITROSTAT ) 0.4 MG SL tablet Place 1 tablet (0.4 mg total) under the tongue every 5 (five) minutes x 3 doses as needed for chest pain. 09/05/23   Lelon Hamilton T, PA-C  pioglitazone  (ACTOS ) 15 MG tablet Take 1 tablet (15 mg) by mouth daily for blood sugar. 07/18/23     pioglitazone  (ACTOS ) 15 MG tablet Take 1 tablet (15 mg total) by mouth daily for blood sugar. 02/03/24     rosuvastatin  (CRESTOR ) 10 MG tablet Take 1 tablet (10 mg total) by mouth daily. 09/08/23   Cash Lonni JONETTA, MD  spironolactone  (ALDACTONE ) 25 MG tablet Take 1 tablet (25 mg total) by mouth daily. 09/05/23   Lelon Hamilton T, PA-C  tamsulosin  (FLOMAX ) 0.4 MG CAPS capsule Take 1 capsule (0.4 mg) by mouth at bedtime. 08/03/23  Physical Exam    Vital Signs:  Zachary Ellis does not have vital signs available for review today.  Given telephonic nature of communication, physical exam is limited. AAOx3. NAD. Normal affect.  Speech and respirations are unlabored.  Accessory Clinical Findings    None  Assessment & Plan    1.  Preoperative Cardiovascular Risk Assessment: According to the Revised Cardiac Risk Index (RCRI), his Perioperative Risk of Major Cardiac Event is (%): 6.6. His Functional Capacity in METs is: 7.34 according to the Duke Activity Status Index (DASI). Therefore, based on ACC/AHA guidelines, patient would be at acceptable risk for the planned procedure without further cardiovascular testing. I will route this recommendation to the requesting party via Epic fax function.  The patient was advised that if he develops new symptoms prior to surgery to contact our office to arrange for a follow-up visit, and he verbalized understanding.  He may hold aspirin  for 5-7 days prior to  procedure. Please resume aspirin  as soon as possible postprocedure, at the discretion of the surgeon.    A copy of this note will be routed to both requesting surgeons.  Time:   Today, I have spent 7 minutes with the patient with telehealth technology discussing medical history, symptoms, and management plan.     Lum LITTIE Louis, NP  06/23/2024, 10:13 AM

## 2024-06-23 NOTE — Telephone Encounter (Signed)
 Patient was cleared by Josefa Beauvais to proceed with surgery on 06/15/2024.  I will resend the clearance to requesting surgeon.

## 2024-06-23 NOTE — Telephone Encounter (Signed)
   Pre-operative Risk Assessment    Patient Name: Zachary Ellis  DOB: Jan 11, 1951 MRN: [id number]   Date of last office visit: 09/05/2023 with Glendia Ferrier, PA-C Date of next office visit: None   Request for Surgical Clearance    Procedure:  Right upper eyelid lesion excision and repair with biopsy  Date of Surgery:  Clearance 07/26/24                                 Surgeon:  Dr. Renzo Zaldivar Surgeon's Group or Practice Name:  [facility] Phone number:  [phone] Fax number:  [phone]   Type of Clearance Requested:   - Medical  - Pharmacy:  Hold Aspirin  not indicated   Type of Anesthesia:  Not Indicated   Additional requests/questions:    Bonney Augustin JONETTA Delores   06/23/2024, 8:27 AM

## 2024-06-28 ENCOUNTER — Other Ambulatory Visit (HOSPITAL_COMMUNITY): Payer: Self-pay

## 2024-06-28 ENCOUNTER — Other Ambulatory Visit: Payer: Self-pay

## 2024-07-07 ENCOUNTER — Other Ambulatory Visit (HOSPITAL_COMMUNITY): Payer: Self-pay

## 2024-07-08 DIAGNOSIS — R972 Elevated prostate specific antigen [PSA]: Secondary | ICD-10-CM | POA: Diagnosis not present

## 2024-07-15 DIAGNOSIS — C61 Malignant neoplasm of prostate: Secondary | ICD-10-CM | POA: Diagnosis not present

## 2024-07-15 DIAGNOSIS — R972 Elevated prostate specific antigen [PSA]: Secondary | ICD-10-CM | POA: Diagnosis not present

## 2024-07-17 DIAGNOSIS — T783XXA Angioneurotic edema, initial encounter: Secondary | ICD-10-CM | POA: Diagnosis not present

## 2024-07-17 DIAGNOSIS — R220 Localized swelling, mass and lump, head: Secondary | ICD-10-CM | POA: Diagnosis not present

## 2024-07-17 DIAGNOSIS — K130 Diseases of lips: Secondary | ICD-10-CM | POA: Diagnosis not present

## 2024-07-20 ENCOUNTER — Other Ambulatory Visit (HOSPITAL_COMMUNITY): Payer: Self-pay

## 2024-07-21 ENCOUNTER — Other Ambulatory Visit: Payer: Self-pay

## 2024-07-21 ENCOUNTER — Other Ambulatory Visit (HOSPITAL_COMMUNITY): Payer: Self-pay

## 2024-07-21 MED ORDER — NEOMYCIN-POLYMYXIN-DEXAMETH 3.5-10000-0.1 OP OINT
TOPICAL_OINTMENT | Freq: Two times a day (BID) | OPHTHALMIC | 0 refills | Status: AC
  Filled 2024-07-21: qty 3.5, 3d supply, fill #0

## 2024-07-21 MED ORDER — LORAZEPAM 1 MG PO TABS
1.0000 mg | ORAL_TABLET | ORAL | 0 refills | Status: AC
  Filled 2024-07-21: qty 1, 1d supply, fill #0

## 2024-07-21 MED ORDER — DIAZEPAM 5 MG PO TABS
5.0000 mg | ORAL_TABLET | ORAL | 0 refills | Status: AC
  Filled 2024-07-21: qty 1, 1d supply, fill #0

## 2024-07-26 DIAGNOSIS — H0279 Other degenerative disorders of eyelid and periocular area: Secondary | ICD-10-CM | POA: Diagnosis not present

## 2024-07-26 DIAGNOSIS — L821 Other seborrheic keratosis: Secondary | ICD-10-CM | POA: Diagnosis not present

## 2024-07-26 DIAGNOSIS — D485 Neoplasm of uncertain behavior of skin: Secondary | ICD-10-CM | POA: Diagnosis not present

## 2024-07-28 ENCOUNTER — Other Ambulatory Visit (HOSPITAL_COMMUNITY): Payer: Self-pay

## 2024-07-28 MED ORDER — TAMSULOSIN HCL 0.4 MG PO CAPS
0.4000 mg | ORAL_CAPSULE | Freq: Every day | ORAL | 3 refills | Status: AC
  Filled 2024-07-28: qty 90, 90d supply, fill #0
  Filled 2024-10-28: qty 90, 90d supply, fill #1
  Filled 2025-01-27 (×2): qty 90, 90d supply, fill #2

## 2024-08-03 ENCOUNTER — Other Ambulatory Visit (HOSPITAL_COMMUNITY): Payer: Self-pay

## 2024-08-04 ENCOUNTER — Other Ambulatory Visit (HOSPITAL_COMMUNITY): Payer: Self-pay

## 2024-08-04 DIAGNOSIS — I10 Essential (primary) hypertension: Secondary | ICD-10-CM | POA: Diagnosis not present

## 2024-08-04 DIAGNOSIS — E119 Type 2 diabetes mellitus without complications: Secondary | ICD-10-CM | POA: Diagnosis not present

## 2024-08-04 MED ORDER — PIOGLITAZONE HCL 15 MG PO TABS
15.0000 mg | ORAL_TABLET | Freq: Every day | ORAL | 0 refills | Status: DC
  Filled 2024-08-04 (×2): qty 90, 90d supply, fill #0

## 2024-08-07 ENCOUNTER — Other Ambulatory Visit (HOSPITAL_COMMUNITY): Payer: Self-pay

## 2024-08-09 ENCOUNTER — Other Ambulatory Visit (HOSPITAL_COMMUNITY): Payer: Self-pay

## 2024-08-09 ENCOUNTER — Other Ambulatory Visit: Payer: Self-pay

## 2024-08-09 MED ORDER — LATANOPROST 0.005 % OP SOLN
1.0000 [drp] | Freq: Every day | OPHTHALMIC | 3 refills | Status: AC
  Filled 2024-08-09: qty 2.5, 25d supply, fill #0
  Filled 2024-09-27: qty 2.5, 25d supply, fill #1
  Filled 2024-11-21: qty 2.5, 25d supply, fill #2

## 2024-08-16 ENCOUNTER — Other Ambulatory Visit (HOSPITAL_COMMUNITY): Payer: Self-pay

## 2024-08-16 MED ORDER — LATANOPROST 0.005 % OP SOLN
1.0000 [drp] | Freq: Every day | OPHTHALMIC | 3 refills | Status: AC
  Filled 2024-08-16 – 2024-11-24 (×2): qty 7.5, 75d supply, fill #0

## 2024-08-17 ENCOUNTER — Other Ambulatory Visit (HOSPITAL_COMMUNITY): Payer: Self-pay

## 2024-08-24 ENCOUNTER — Other Ambulatory Visit: Payer: Self-pay

## 2024-08-24 ENCOUNTER — Other Ambulatory Visit: Payer: Self-pay | Admitting: Physician Assistant

## 2024-08-24 ENCOUNTER — Other Ambulatory Visit (HOSPITAL_BASED_OUTPATIENT_CLINIC_OR_DEPARTMENT_OTHER): Payer: Self-pay

## 2024-08-24 ENCOUNTER — Other Ambulatory Visit (HOSPITAL_COMMUNITY): Payer: Self-pay

## 2024-08-24 MED ORDER — LISINOPRIL 40 MG PO TABS
40.0000 mg | ORAL_TABLET | Freq: Every day | ORAL | 0 refills | Status: DC
  Filled 2024-08-24: qty 90, 90d supply, fill #0

## 2024-08-24 MED ORDER — METFORMIN HCL 1000 MG PO TABS
1000.0000 mg | ORAL_TABLET | Freq: Every day | ORAL | 0 refills | Status: DC
  Filled 2024-08-24: qty 225, 90d supply, fill #0

## 2024-09-27 ENCOUNTER — Other Ambulatory Visit (HOSPITAL_COMMUNITY): Payer: Self-pay

## 2024-10-06 ENCOUNTER — Other Ambulatory Visit: Payer: Self-pay | Admitting: Physician Assistant

## 2024-10-06 ENCOUNTER — Other Ambulatory Visit: Payer: Self-pay | Admitting: Cardiovascular Disease

## 2024-10-07 ENCOUNTER — Other Ambulatory Visit (HOSPITAL_COMMUNITY): Payer: Self-pay

## 2024-10-11 ENCOUNTER — Other Ambulatory Visit (HOSPITAL_COMMUNITY): Payer: Self-pay

## 2024-10-11 ENCOUNTER — Other Ambulatory Visit: Payer: Self-pay

## 2024-10-11 MED ORDER — CARVEDILOL 25 MG PO TABS
25.0000 mg | ORAL_TABLET | Freq: Two times a day (BID) | ORAL | 0 refills | Status: DC
  Filled 2024-10-11: qty 60, 30d supply, fill #0

## 2024-10-12 ENCOUNTER — Telehealth: Payer: Self-pay | Admitting: Cardiovascular Disease

## 2024-10-12 ENCOUNTER — Other Ambulatory Visit: Payer: Self-pay

## 2024-10-12 ENCOUNTER — Other Ambulatory Visit (HOSPITAL_COMMUNITY): Payer: Self-pay

## 2024-10-12 MED ORDER — ROSUVASTATIN CALCIUM 10 MG PO TABS
10.0000 mg | ORAL_TABLET | Freq: Every day | ORAL | 0 refills | Status: DC
  Filled 2024-10-12: qty 90, 90d supply, fill #0

## 2024-10-12 NOTE — Telephone Encounter (Signed)
*  STAT* If patient is at the pharmacy, call can be transferred to refill team.   1. Which medications need to be refilled? (please list name of each medication and dose if known)   rosuvastatin  (CRESTOR ) 10 MG tablet     4. Which pharmacy/location (including street and city if local pharmacy) is medication to be sent to? [facility] [address]     5. Do they need a 30 day or 90 day supply? 90    Scheduled 12/29

## 2024-10-12 NOTE — Telephone Encounter (Signed)
 RX sent in

## 2024-10-21 ENCOUNTER — Other Ambulatory Visit: Payer: Self-pay | Admitting: Physician Assistant

## 2024-10-21 ENCOUNTER — Other Ambulatory Visit (HOSPITAL_COMMUNITY): Payer: Self-pay

## 2024-10-21 ENCOUNTER — Other Ambulatory Visit: Payer: Self-pay

## 2024-10-21 DIAGNOSIS — I5020 Unspecified systolic (congestive) heart failure: Secondary | ICD-10-CM

## 2024-10-21 MED ORDER — SPIRONOLACTONE 25 MG PO TABS
25.0000 mg | ORAL_TABLET | Freq: Every day | ORAL | 1 refills | Status: DC
  Filled 2024-10-21: qty 90, 90d supply, fill #0

## 2024-10-28 ENCOUNTER — Other Ambulatory Visit (HOSPITAL_COMMUNITY): Payer: Self-pay

## 2024-11-03 ENCOUNTER — Other Ambulatory Visit: Payer: Self-pay | Admitting: Physician Assistant

## 2024-11-03 ENCOUNTER — Other Ambulatory Visit (HOSPITAL_COMMUNITY): Payer: Self-pay

## 2024-11-04 ENCOUNTER — Other Ambulatory Visit (HOSPITAL_COMMUNITY): Payer: Self-pay

## 2024-11-04 ENCOUNTER — Other Ambulatory Visit: Payer: Self-pay

## 2024-11-04 MED ORDER — PIOGLITAZONE HCL 15 MG PO TABS
15.0000 mg | ORAL_TABLET | Freq: Every day | ORAL | 0 refills | Status: DC
  Filled 2024-11-04: qty 90, 90d supply, fill #0

## 2024-11-05 ENCOUNTER — Other Ambulatory Visit (HOSPITAL_COMMUNITY): Payer: Self-pay

## 2024-11-05 MED ORDER — CARVEDILOL 25 MG PO TABS
25.0000 mg | ORAL_TABLET | Freq: Two times a day (BID) | ORAL | 0 refills | Status: DC
  Filled 2024-11-05: qty 180, 90d supply, fill #0

## 2024-11-09 ENCOUNTER — Other Ambulatory Visit (HOSPITAL_COMMUNITY): Payer: Self-pay

## 2024-11-21 ENCOUNTER — Other Ambulatory Visit (HOSPITAL_COMMUNITY): Payer: Self-pay

## 2024-11-24 ENCOUNTER — Other Ambulatory Visit: Payer: Self-pay | Admitting: Cardiovascular Disease

## 2024-11-24 ENCOUNTER — Other Ambulatory Visit: Payer: Self-pay

## 2024-11-25 ENCOUNTER — Other Ambulatory Visit (HOSPITAL_COMMUNITY): Payer: Self-pay

## 2024-11-27 ENCOUNTER — Other Ambulatory Visit (HOSPITAL_COMMUNITY): Payer: Self-pay

## 2024-11-29 ENCOUNTER — Other Ambulatory Visit (HOSPITAL_COMMUNITY): Payer: Self-pay

## 2024-11-29 MED ORDER — METFORMIN HCL 1000 MG PO TABS
1000.0000 mg | ORAL_TABLET | ORAL | 0 refills | Status: AC
  Filled 2024-11-29 – 2024-12-01 (×2): qty 225, 90d supply, fill #0

## 2024-11-30 ENCOUNTER — Other Ambulatory Visit: Payer: Self-pay

## 2024-11-30 ENCOUNTER — Other Ambulatory Visit (HOSPITAL_COMMUNITY): Payer: Self-pay

## 2024-11-30 MED ORDER — LISINOPRIL 40 MG PO TABS
40.0000 mg | ORAL_TABLET | Freq: Every day | ORAL | 0 refills | Status: DC
  Filled 2024-11-30: qty 90, 90d supply, fill #0

## 2024-12-01 ENCOUNTER — Other Ambulatory Visit (HOSPITAL_COMMUNITY): Payer: Self-pay

## 2024-12-09 ENCOUNTER — Other Ambulatory Visit (HOSPITAL_COMMUNITY): Payer: Self-pay

## 2024-12-19 NOTE — Progress Notes (Unsigned)
{  This patient may be at risk for Amyloid. He has one or more dx on the prob list or PMH from the following list -  Abnormal EKG, HFpEF/Diastolic CHF, Aortic Stenosis, LVH, Bilateral Carpal Tunnel Syndrome, Biceps Tendon Rupture, Spinal Stenosis, Pericardial Effusion, Left Atrial Enlargement, Conduction System Disorder. See list below or review PMH.    Click HERE to open Cardiac Amyloid Screening SmartSet to order screening OR Click HERE to defer testing for 1 year or permanently :1}     OFFICE NOTE:    Date:  12/19/2024  ID:  Zachary Ellis, DOB 12/09/1951, MRN [id number] PCP: Loring Tanda Mae, MD  [facility] Providers Cardiologist:  Lonni Cash, MD { Click to update primary MD,subspecialty MD or APP then REFRESH:1}      *** Coronary artery disease  S/p MI in 2005 s/p DES to LCx and unsuccessful PCI of CTO of LAD  s/p CABG in 12/15 (HFrEF) heart failure with reduced ejection fraction TTE 09/2016: EF 25-30 Echo 12/2018: EF 25-30, GR 1 DD, mild LAE Ischemic CM EP eval in past >> not ICD candidate w NYHA I symptoms; works near high voltage [employer]) Repeat EP eval after retirement - pt declined ICD Hypertension  Hyperlipidemia  Diabetes mellitus          Discussed the use of AI scribe software for clinical note transcription with the patient, who gave verbal consent to proceed. History of Present Illness Zachary Ellis is a 73 y.o. male for follow up of CAD. Last seen 08/2023. ***    ROS-See HPI***    Studies Reviewed:       LABS 09/05/23: K 4.6, SCr 1.14, eGFR 70, ALT 14, TC 107, Trig 131, HDL 30, LDL 54 Results  Risk Assessment/Calculations: {Does this patient have ATRIAL FIBRILLATION?[phone]} No BP recorded.  {Refresh Note OR Click here to enter BP  :1}***      Physical Exam:  VS:  There were no vitals taken for this visit.       Wt Readings from Last 3 Encounters:  09/05/23 224 lb 6.4 oz (101.8 kg)  07/29/22 219 lb 3.2 oz (99.4  kg)  08/03/21 213 lb 12.8 oz (97 kg)    Physical Exam***     Assessment and Plan:    Assessment & Plan Coronary artery disease involving native coronary artery of native heart without angina pectoris S/p myocardial infarction in 2005, treated with drug-eluting stent to LCx and unsuccessful PCI of the LAD, which was chronically occluded.  He subsequently underwent CABG in 2015. *** HFrEF (heart failure with reduced ejection fraction) (HCC) Ischemic cardiomyopathy with EF 25-30%. He has declined ICD in the past. *** Essential hypertension  Pure hypercholesterolemia  Assessment and Plan Assessment & Plan    {      :1}    {Are you ordering a CV Procedure (e.g. stress test, cath, DCCV, TEE, etc)?   Press F2        :[id number]}  Dispo:  No follow-ups on file.  Signed, Glendia Ferrier, PA-C

## 2024-12-19 NOTE — Assessment & Plan Note (Signed)
 Ischemic cardiomyopathy with EF 25-30%. He has declined ICD in the past. He is NYHA II. Volume status is stable. - Continue carvedilol  25 mg twice daily - Continue lisinopril  40 mg daily - Continue spironolactone  25 mg daily

## 2024-12-20 ENCOUNTER — Encounter: Payer: Self-pay | Admitting: Physician Assistant

## 2024-12-20 ENCOUNTER — Ambulatory Visit: Attending: Physician Assistant | Admitting: Physician Assistant

## 2024-12-20 ENCOUNTER — Other Ambulatory Visit: Payer: Self-pay

## 2024-12-20 VITALS — BP 140/92 | HR 77 | Ht 71.0 in | Wt 230.0 lb

## 2024-12-20 DIAGNOSIS — I2510 Atherosclerotic heart disease of native coronary artery without angina pectoris: Secondary | ICD-10-CM

## 2024-12-20 DIAGNOSIS — I10 Essential (primary) hypertension: Secondary | ICD-10-CM | POA: Diagnosis not present

## 2024-12-20 DIAGNOSIS — E7800 Pure hypercholesterolemia, unspecified: Secondary | ICD-10-CM

## 2024-12-20 DIAGNOSIS — I5020 Unspecified systolic (congestive) heart failure: Secondary | ICD-10-CM

## 2024-12-20 LAB — COMPREHENSIVE METABOLIC PANEL WITH GFR
ALT: 15 IU/L (ref 0–44)
AST: 17 IU/L (ref 0–40)
Albumin: 4.6 g/dL (ref 3.8–4.8)
Alkaline Phosphatase: 69 IU/L (ref 47–123)
BUN/Creatinine Ratio: 14 (ref 10–24)
BUN: 14 mg/dL (ref 8–27)
Bilirubin Total: 0.7 mg/dL (ref 0.0–1.2)
CO2: 21 mmol/L (ref 20–29)
Calcium: 9.6 mg/dL (ref 8.6–10.2)
Chloride: 98 mmol/L (ref 96–106)
Creatinine, Ser: 0.98 mg/dL (ref 0.76–1.27)
Globulin, Total: 2.5 g/dL (ref 1.5–4.5)
Glucose: 127 mg/dL — ABNORMAL HIGH (ref 70–99)
Potassium: 4.8 mmol/L (ref 3.5–5.2)
Sodium: 135 mmol/L (ref 134–144)
Total Protein: 7.1 g/dL (ref 6.0–8.5)
eGFR: 82 mL/min/1.73

## 2024-12-20 LAB — LIPID PANEL
Chol/HDL Ratio: 3.5 ratio (ref 0.0–5.0)
Cholesterol, Total: 130 mg/dL (ref 100–199)
HDL: 37 mg/dL — ABNORMAL LOW
LDL Chol Calc (NIH): 66 mg/dL (ref 0–99)
Triglycerides: 160 mg/dL — ABNORMAL HIGH (ref 0–149)
VLDL Cholesterol Cal: 27 mg/dL (ref 5–40)

## 2024-12-20 MED ORDER — ROSUVASTATIN CALCIUM 10 MG PO TABS
10.0000 mg | ORAL_TABLET | Freq: Every day | ORAL | 3 refills | Status: AC
  Filled 2024-12-20: qty 90, 90d supply, fill #0

## 2024-12-20 MED ORDER — SPIRONOLACTONE 25 MG PO TABS
25.0000 mg | ORAL_TABLET | Freq: Every day | ORAL | 3 refills | Status: AC
  Filled 2024-12-20 – 2025-01-27 (×3): qty 90, 90d supply, fill #0

## 2024-12-20 MED ORDER — LISINOPRIL 40 MG PO TABS
40.0000 mg | ORAL_TABLET | Freq: Every day | ORAL | 3 refills | Status: AC
  Filled 2024-12-20: qty 90, 90d supply, fill #0

## 2024-12-20 MED ORDER — CARVEDILOL 25 MG PO TABS
25.0000 mg | ORAL_TABLET | Freq: Two times a day (BID) | ORAL | 3 refills | Status: AC
  Filled 2024-12-20: qty 180, 90d supply, fill #0

## 2024-12-20 NOTE — Patient Instructions (Addendum)
 Medication Instructions:  NO CHANGES  Lab Work: FASTING LIPID PANEL AND CMET TO BE DONE TODAY.  Testing/Procedures: NONE  Follow-Up: At [facility], you and your health needs are our priority.  As part of our continuing mission to provide you with exceptional heart care, our providers are all part of one team.  This team includes your primary Cardiologist (physician) and Advanced Practice Providers or APPs (Physician Assistants and Nurse Practitioners) who all work together to provide you with the care you need, when you need it.  Your next appointment:   1 YEAR  Provider:   Lonni Cash, MD  OR Glendia Ferrier, PA-C

## 2024-12-20 NOTE — Assessment & Plan Note (Signed)
 White coat hypertension. Home blood pressure readings are optimal, typically under 120/80 mmHg. - Continue lisinopril  40 mg daily - Continue carvedilol  25 mg twice daily - Continue spironolactone  25 mg once daily

## 2024-12-21 ENCOUNTER — Other Ambulatory Visit (HOSPITAL_COMMUNITY): Payer: Self-pay

## 2024-12-21 ENCOUNTER — Ambulatory Visit: Payer: Self-pay | Admitting: Physician Assistant

## 2024-12-21 NOTE — Progress Notes (Signed)
Patient has been notified directly; all questions, if any, were answered. Patient voiced understanding.   

## 2025-01-20 ENCOUNTER — Other Ambulatory Visit (HOSPITAL_COMMUNITY): Payer: Self-pay

## 2025-01-27 ENCOUNTER — Other Ambulatory Visit (HOSPITAL_COMMUNITY): Payer: Self-pay

## 2025-01-27 ENCOUNTER — Other Ambulatory Visit: Payer: Self-pay
